# Patient Record
Sex: Female | Born: 1960 | Race: White | Hispanic: No | Smoking: Never smoker
Health system: Southern US, Community
[De-identification: ages and names within clinical notes are randomized; demographics above are authoritative.]

## PROBLEM LIST (undated history)

## (undated) DIAGNOSIS — E785 Hyperlipidemia, unspecified: Secondary | ICD-10-CM

## (undated) DIAGNOSIS — D682 Hereditary deficiency of other clotting factors: Secondary | ICD-10-CM

## (undated) DIAGNOSIS — Z6841 Body Mass Index (BMI) 40.0 and over, adult: Secondary | ICD-10-CM

## (undated) DIAGNOSIS — I8390 Asymptomatic varicose veins of unspecified lower extremity: Secondary | ICD-10-CM

## (undated) DIAGNOSIS — T7840XA Allergy, unspecified, initial encounter: Secondary | ICD-10-CM

## (undated) DIAGNOSIS — F32A Depression, unspecified: Secondary | ICD-10-CM

## (undated) DIAGNOSIS — R0789 Other chest pain: Secondary | ICD-10-CM

## (undated) DIAGNOSIS — F419 Anxiety disorder, unspecified: Secondary | ICD-10-CM

## (undated) DIAGNOSIS — G43909 Migraine, unspecified, not intractable, without status migrainosus: Secondary | ICD-10-CM

## (undated) DIAGNOSIS — F329 Major depressive disorder, single episode, unspecified: Secondary | ICD-10-CM

## (undated) DIAGNOSIS — I8290 Acute embolism and thrombosis of unspecified vein: Secondary | ICD-10-CM

## (undated) HISTORY — DX: Major depressive disorder, single episode, unspecified: F32.9

## (undated) HISTORY — DX: Allergy, unspecified, initial encounter: T78.40XA

## (undated) HISTORY — DX: Hyperlipidemia, unspecified: E78.5

## (undated) HISTORY — DX: Migraine, unspecified, not intractable, without status migrainosus: G43.909

## (undated) HISTORY — DX: Morbid (severe) obesity due to excess calories: E66.01

## (undated) HISTORY — PX: VEIN LIGATION: SHX2652

## (undated) HISTORY — DX: Depression, unspecified: F32.A

## (undated) HISTORY — DX: Body Mass Index (BMI) 40.0 and over, adult: Z684

## (undated) HISTORY — DX: Asymptomatic varicose veins of unspecified lower extremity: I83.90

## (undated) HISTORY — DX: Acute embolism and thrombosis of unspecified vein: I82.90

## (undated) HISTORY — DX: Anxiety disorder, unspecified: F41.9

## (undated) HISTORY — DX: Hereditary deficiency of other clotting factors: D68.2

## (undated) HISTORY — DX: Other chest pain: R07.89

---

## 1998-10-20 ENCOUNTER — Emergency Department (HOSPITAL_COMMUNITY): Admission: EM | Admit: 1998-10-20 | Discharge: 1998-10-20 | Payer: Self-pay | Admitting: Emergency Medicine

## 1998-10-21 ENCOUNTER — Encounter: Payer: Self-pay | Admitting: Emergency Medicine

## 1998-11-13 ENCOUNTER — Encounter: Payer: Self-pay | Admitting: Urology

## 1998-11-13 ENCOUNTER — Encounter: Admission: RE | Admit: 1998-11-13 | Discharge: 1998-11-13 | Payer: Self-pay | Admitting: Urology

## 1998-11-23 ENCOUNTER — Ambulatory Visit (HOSPITAL_COMMUNITY): Admission: RE | Admit: 1998-11-23 | Discharge: 1998-11-23 | Payer: Self-pay | Admitting: Urology

## 1998-11-23 ENCOUNTER — Encounter: Payer: Self-pay | Admitting: Urology

## 2000-10-22 ENCOUNTER — Emergency Department (HOSPITAL_COMMUNITY): Admission: EM | Admit: 2000-10-22 | Discharge: 2000-10-22 | Payer: Self-pay | Admitting: Emergency Medicine

## 2000-10-22 ENCOUNTER — Encounter: Payer: Self-pay | Admitting: Emergency Medicine

## 2003-09-18 ENCOUNTER — Emergency Department (HOSPITAL_COMMUNITY): Admission: EM | Admit: 2003-09-18 | Discharge: 2003-09-18 | Payer: Self-pay | Admitting: Emergency Medicine

## 2004-01-18 HISTORY — PX: TUBAL LIGATION: SHX77

## 2004-06-17 ENCOUNTER — Encounter (INDEPENDENT_AMBULATORY_CARE_PROVIDER_SITE_OTHER): Payer: Self-pay | Admitting: *Deleted

## 2004-06-17 ENCOUNTER — Ambulatory Visit (HOSPITAL_COMMUNITY): Admission: RE | Admit: 2004-06-17 | Discharge: 2004-06-17 | Payer: Self-pay | Admitting: Obstetrics and Gynecology

## 2008-02-15 ENCOUNTER — Ambulatory Visit: Payer: Self-pay

## 2010-04-12 ENCOUNTER — Other Ambulatory Visit: Payer: Self-pay | Admitting: Obstetrics and Gynecology

## 2010-06-04 NOTE — H&P (Signed)
[name], [name]                ACCOUNT NO.:  [id number]   MEDICAL RECORD NO.:  [id number]          PATIENT TYPE:  AMB   LOCATION:  [facility]C                           FACILITY:  [facility]   PHYSICIAN:  Richardean Sale, M.D.   DATE OF BIRTH:  12/02/1960   DATE OF ADMISSION:  06/17/2004  DATE OF DISCHARGE:                                HISTORY & PHYSICAL   PREOPERATIVE DIAGNOSES:  1.  Menorrhagia.  2.  Desires permanent sterilization.   HISTORY OF PRESENT ILLNESS:  This is a 50 year old gravida 0 white female  who has a history of heavy menses that were well controlled with oral  contraceptive pills. The patient subsequently developed a blood clot in her  thigh while on an extended plane flight in August 2005. The patient was  treated with Coumadin and has had no further evidence of clotting. She  undergone evaluation for hypercoagulable state. All of her labs were within  normal limits with the exception of her IGM anticardiolipin antibody which  was borderline elevated. In regards to the patient's menorrhagia, this was  well controlled on oral contraceptive pills. She did have some transient  hypertension on birth control pills but her oral contraceptive pills were  discontinued after development of her blood clot. Since that time, she has  had persistently heavy menses that are regular. Denies any intramenstrual  bleeding.   She presents today for laparoscopic tubal sterilization due to inability to  tolerate hormonal contraceptive pills and hysteroscopy D&C with NovaSure  endometrial ablation for control of menorrhagia.   PAST MEDICAL HISTORY:  1.  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 Does      not require prolonged anticoagulation.  2.  Hypertension on birth control pills. Resolved once pills discontinued.  3.  Menorrhagia.  4.  Migraine.  5.  History of kidney stones.   PAST SURGICAL HISTORY:  None.   OBSTETRICAL HISTORY:  Gravida 0.   GYNECOLOGIC HISTORY:  No history of abnormal Pap smears or sexually  transmitted infection, monogamous. Currently using contraceptive films.   SOCIAL HISTORY:  Denies tobacco, alcohol, or drugs.   FAMILY HISTORY:  No known history of hypercoagulable state or DVT. No  breast, ovarian, uterine, or colon cancer.   MEDICATIONS:  Allegra, Singulair, and Imitrex p.r.n. daily multivitamin.   ALLERGIES:  CODEINE causes nausea and vomiting.   REVIEW OF SYMPTOMS:  Denies chest pain, shortness of breath, lower extremity  edema, or tenderness. No abdominal pain. No fever, chills, sweats, or other  constitutional symptoms.   PHYSICAL EXAMINATION:  VITAL SIGNS:  Blood pressure 102/66, weight 206.  Height 5 foot 5.  Afebrile.  GENERAL:  She is a well-developed, well-nourished white female who is no  acute distress.  NECK:  Within normal limits without masses.  HEART:  Regular rate and rhythm.  LUNGS:  Clear to auscultation bilaterally.  ABDOMEN:  Soft, nontender, and nondistended with no palpable masses. Liver  and spleen are normal.  NEUROLOGICAL:  Nonfocal.  BREASTS:  Symmetric, nontender with no palpable masses. No discharge, no  lymphadenopathy.  PELVIC:  The external genitalia are within normal limits without lesions.  VAGINA:  Pink, moist, and rugated without lesions.  CERVIX:  Visibly normal. The last Pap smear May 2006 within normal limits.  No cervical motion tenderness. Uterus is small, mobile, and nontender.  Retroverted. No adnexal masses or tenderness.   ASSESSMENT:  A 50 year old gravida 0 white female with menorrhagia, desires  permanent sterilization. History of hypertension and venous thrombus while  on oral contraceptive pills.   PLAN:  We will proceed with hysteroscopy, D&C, and NovaSure endometrial  ablation for treatment of menorrhagia and laparoscopic tubal sterilization.  The risks, benefits, and alternatives of the procedure have been reviewed  with the patient in  detail. We discussed the risks of recurrent DVT,  possible pulmonary embolus which could result in death, and recommended  preoperative anticoagulation with subcutaneous heparin for DVT prophylaxis.  We reviewed the risks of hemorrhage, infection, injury to the bowel or the  bladder, uterus, or other intra-abdominal organs which could require  additional surgery. The possibility of the need to perform hysterectomy and  anesthetic related complications. We reviewed alternatives which would be to  proceed with a levonorgestrel-containing IUD but the patient has declined  this option. She is unable to take oral contraceptive pills and has had  persistent breakthrough bleeding while on progesterone only contraceptive  pills. The patient voices understand of all the above risks. She understands  that there is up to a 1% failure rate for tubal sterilization and there is  an increased risk for ectopic pregnancy. Also reviewed the possibility that  the endometrial ablation may not be 100% successful and that she may  experience heavy menses in the future and may require an additional  procedure. I reviewed that the goal of endometrial ablation is to decrease  the amount of menstrual flow and may or may not cause the cessation of  menses. The patient has had endometrial biopsy which was negative. We will  perform D&C at the time of the procedure to rule out any underlying  endometrial pathology. The patient voices understanding of all the above  risks, benefits, and alternatives and desires to proceed. Informed consent  has been obtained.       JW/MEDQ  D:  06/16/2004  T:  06/16/2004  Job:  [id number]

## 2010-06-04 NOTE — Op Note (Signed)
NAMECHRISTELL, [name]                ACCOUNT NO.:  [id number]   MEDICAL RECORD NO.:  [id number]          PATIENT TYPE:  AMB   LOCATION:  [facility]C                           FACILITY:  [facility]   PHYSICIAN:  Richardean Sale, M.D.   DATE OF BIRTH:  [birth year]   DATE OF PROCEDURE:  06/17/2004  DATE OF DISCHARGE:                                 OPERATIVE REPORT   PREOPERATIVE DIAGNOSES:  1.  Desires permanent sterilization.  2.  Menorrhagia.   POSTOPERATIVE DIAGNOSES:  1.  Desires permanent sterilization.  2.  Menorrhagia.   PROCEDURE:  D&C hysteroscopy with attempted NovaSure endometrial ablation  and laparoscopic tubal sterilization.   SURGEON:  Richardean Sale, M.D.   ASSISTANT:  None.   ANESTHESIA:  General.   COMPLICATIONS:  None.   ESTIMATED BLOOD LOSS:  Minimal.   FLUID DEFICIT FOR THE HYSTEROSCOPY:  40 mL.   FINDINGS:  Uterine sound length of 8 cm, cervical length  4 cm. Cavity width  as assessed by NovaSure apparatus only 2.2 cm. Normal appearing endometrial  cavity with a retroflexed uterus.   LAPAROSCOPIC FINDINGS:  Normal ovaries and fallopian tubes bilaterally,  normal liver and gallbladder. Unable to adequately visualize the appendix  secondary to retrocecal placement. Normal appearing uterus.   INDICATIONS FOR PROCEDURE:  This is a 50 year old, gravida 0, white female  who has a history of menorrhagia which was well controlled on oral  contraceptive pills. The patient developed a venous thrombus in August of  2005 during a long plane flight while she was taking oral contraceptive  pills and she is no longer a candidate for combined hormonal contraception.  Since discontinuing her pills, the patient's menses have continued to be  heavy and she desires surgical treatment at this time. In addition, the  patient voices desire for no further childbearing and desires permanent  sterilization. Prior to the procedure, the risks, benefits, and alternatives  of the alternatives  of the procedure were reviewed with the patient in  detail. We reviewed alternatives such as a levonorgestrel IUD, progesterone  only oral contraceptive pills and Depo-Provera. We discussed the risks of  the procedure which include but not limited to hemorrhage requiring  transfusion, infection, injury to the bowel, the bladder, the uterus or  other organs which could additional surgery possibly requiring hysterectomy  or bowel or bladder surgery. We also discussed the risk of recurrent deep  vein thrombosis and pulmonary embolus which could be fatal. Also discussed  the risk of cardiac events and anesthesia. The patient voices understanding  of all the risks and benefits and alternatives and desires to proceed.  Informed consent was obtained.   DESCRIPTION OF PROCEDURE:  Two hours prior to the procedure, the patient  received a subcutaneous injection of heparin 5000 units for DVT prophylaxis.  The patient was then taken to the operating room, given general anesthesia  and was then prepped and draped in the usual sterile fashion and placed in  the dorsal lithotomy position. A Foley catheter was then placed in the  bladder. Bimanual exam confirmed the presence of a small retroverted  uterus  which was mobile with no obvious masses in the pelvis or adnexa. A speculum  was then placed in the vagina, the cervix was then visualized and a  paracervical block was administered using a total of 18 mL of 1% Nesacaine.  The cervix was grasped on the anterior lip with a single tooth tenaculum and  the uterus was then sounded to 8 cm. The cervix was then very gently  dilated. Once adequate dilation was achieved, the diagnostic hysteroscope  was introduced. Evaluation of the cavity revealed a retroverted uterus, no  obvious endometrial pathology was identified. The hysteroscope was then  removed and a sharp curettage was performed. This specimen was then sent to  pathology labeled as endometrial  curettings. At this point, the cervical  length was then measured with a Pratt dilator and measured at 4 cm. These  measurements were then entered into the NovaSure endometrial ablation  computer device. The NovaSure device was then inserted into the uterine  cavity and two attempts were made to appropriately fit the device into the  cavity. The maximum cavity width that could be achieved after fitting the  device was only 2.2 cm. Given that the recommendation is not to perform an  ablation if the cavity width is less than 2.5 cm, the ablation procedure was  not performed. The endometrial NovaSure ablation device was then removed  from the uterus, the hysteroscope was reintroduced and the uterine cavity  was very carefully inspected. The cavity was intact. There was no evidence  of any perforation and the hysteroscope was removed. Fluid deficit for the  hysteroscopic portion of the procedure was 40 mL. The acorn cannula was then  introduced into the cervix and connected to the single tooth tenaculum. The  posterior weighted speculum was removed and attention was then turned to the  patient's abdomen. Sterile gown and gloves were changed. The patient's  abdomen which had been prepped at the beginning of the procedure had  remained sterile throughout the hysteroscopic procedure. At this point, 5 mL  of 0.25% plain Marcaine were injected into the infraumbilical area. A 10 mm  skin incision was then made with the scalpel. This was carried down sharply  to the fascia. The fascia was then grasped between two Kocher clamps,  elevated and the fascia was then entered sharply. Once the fascial incision  was adequate, the fascia was secured with a pursestring Vicryl suture. At  this point, a significant amount of preperitoneal fat was encountered and  identification of the peritoneum was very difficult. An attempt was made to  enter the peritoneum bluntly. The Hasson trocar and sleeve were  then introduced and the camera was then used and was found to not be in the  peritoneal cavity, but in the preperitoneal space. Therefore the camera was  removed. The Hasson trocar and sleeve were removed and using blunt  dissection, the peritoneum was then able to be identified. It was grasped  between two hemostats and was entered sharply. A finger was placed into the  peritoneal incision and was swept underneath and there was no evidence of  any adhesions of the bowel or omentum to the abdominal wall. At this point,  the Hasson trocar and sleeve were reintroduced. CO2 gas was allowed to  insufflate and the laparoscope was then used to confirm intraabdominal  placement. The liver and gallbladder were then visualized and appeared  normal. The appendix was retrocecal and was difficult to visualize but there  was  no obvious appendiceal pathology. The uterus was identified and was  retroflexed and appeared normal. A second incision was then made 2 cm above  the symphysis pubis. This incision was made after 2 mL of 0.25% plain  Marcaine were injected and a 5 mm skin incision made with the scalpel.  Through this, the 5 mm trocar and sleeve were attempted to be introduced.  Given the significant amount of subcutaneous preperitoneal fat, the extra  long trocar had to be used. This was inserted in the incision very carefully  and intraabdominal placement was confirmed with a laparoscope. There was no  evidence of any trauma to the bowel or any other organs during placement of  either trocar. At this point, a blunt probe was then used to examine the  pelvis with the findings noted above. Both ovaries were visualized and were  normal. Both fallopian tubes were visualized and appeared normal, the  fimbriated ends appeared normal as well. At this point, the blunt probe was  removed, this was replaced by the Kleppinger bipolar cautery and both  fallopian tubes were then successfully cauterized at the  mid portion of the  tube. An approximately 3 cm segment was cauterized and good blanching noted.  At the completion of the cauterization of the fallopian tubes, the pelvis  was inspected and there was no evidence of any active bleeding. The 5 mm  trocar sleeve was then removed under direct visualization. CO2 gas was  allowed to escape and the camera and the Hasson trocar and sleeve were then  removed. A finger was placed into the umbilical incision and the fascial  stitch was pulled tight and tied with good closure of the fascial incision.  The skin incisions were then both closed with 4-0 Vicryl suture in  subcutaneous fashion. At this point, attention was then turned back to the  vagina where the single tooth tenaculum and the acorn cannula were removed.  Silver nitrate was used to cauterize the single tooth tenaculum site. The  patient was then taken out of the dorsal lithotomy position and was awakened  from a general anesthesia and was taken to the recovery room awake in stable condition. All sponge, lap, needle and instrument counts were correct x2 and  there were no complications.       JW/MEDQ  D:  06/17/2004  T:  06/17/2004  Job:  [id number]

## 2011-03-30 ENCOUNTER — Ambulatory Visit (INDEPENDENT_AMBULATORY_CARE_PROVIDER_SITE_OTHER): Payer: BC Managed Care – PPO | Admitting: Emergency Medicine

## 2011-03-30 VITALS — BP 124/86 | HR 74 | Temp 98.2°F | Resp 16 | Ht 64.0 in | Wt 198.0 lb

## 2011-03-30 DIAGNOSIS — F419 Anxiety disorder, unspecified: Secondary | ICD-10-CM

## 2011-03-30 DIAGNOSIS — F411 Generalized anxiety disorder: Secondary | ICD-10-CM

## 2011-03-30 MED ORDER — CLONAZEPAM 0.5 MG PO TABS: 0.5000 mg | ORAL_TABLET | Freq: Two times a day (BID) | ORAL | Status: DC | PRN

## 2011-03-30 MED ORDER — CLONAZEPAM 0.5 MG PO TABS: 0.5000 mg | ORAL_TABLET | Freq: Two times a day (BID) | ORAL | Status: AC | PRN

## 2011-03-30 NOTE — Patient Instructions (Signed)

## 2011-03-30 NOTE — Progress Notes (Signed)
  Subjective:    Patient ID: [name], female    DOB: Apr 28, 1960, 51 y.o.   MRN: [id number]  HPI patient enters with the chief complaint of anxiety. She is under a lot of stress at home. She lived in [place] the last year. She is moving back home and has responsibilities for her mother and father. She is a very stressful job he   Review of Systems review of systems was noncontributory. She is agreeable to have a physical in the near future.     Objective:   Physical Exam HEENT negative thyroid not enlarged. Chest is clear heart regular rate no murmurs Patient completed as an anxiety scale with a raw score of 48 index of  60 consistent with moderate to severe anxiety.      Assessment & Plan:  Patient has an anxiety disorder most likely related to the stress of caring for her parents. She also has a lot of work-related stress.

## 2011-03-31 LAB — T4, FREE: Free T4: 1.34 ng/dL (ref 0.80–1.80)

## 2011-03-31 LAB — TSH: TSH: 1.708 u[IU]/mL (ref 0.350–4.500)

## 2011-07-19 ENCOUNTER — Encounter: Payer: Self-pay | Admitting: Emergency Medicine

## 2011-07-19 ENCOUNTER — Ambulatory Visit (INDEPENDENT_AMBULATORY_CARE_PROVIDER_SITE_OTHER): Payer: BC Managed Care – PPO | Admitting: Emergency Medicine

## 2011-07-19 VITALS — BP 130/89 | HR 76 | Temp 96.9°F | Resp 16 | Ht 64.5 in | Wt 208.6 lb

## 2011-07-19 DIAGNOSIS — R079 Chest pain, unspecified: Secondary | ICD-10-CM

## 2011-07-19 DIAGNOSIS — Z0000 Encounter for general adult medical examination without abnormal findings: Secondary | ICD-10-CM

## 2011-07-19 DIAGNOSIS — R4589 Other symptoms and signs involving emotional state: Secondary | ICD-10-CM

## 2011-07-19 LAB — CBC WITH DIFFERENTIAL/PLATELET
Basophils Absolute: 0 10*3/uL (ref 0.0–0.1)
Eosinophils Relative: 1 % (ref 0–5)
Lymphocytes Relative: 25 % (ref 12–46)
MCV: 89.2 fL (ref 78.0–100.0)
Neutro Abs: 3.1 10*3/uL (ref 1.7–7.7)
Neutrophils Relative %: 66 % (ref 43–77)
Platelets: 223 10*3/uL (ref 150–400)
RDW: 13.6 % (ref 11.5–15.5)
WBC: 4.7 10*3/uL (ref 4.0–10.5)

## 2011-07-19 LAB — POCT URINALYSIS DIPSTICK
Bilirubin, UA: NEGATIVE
Blood, UA: NEGATIVE
Ketones, UA: NEGATIVE
Protein, UA: NEGATIVE
pH, UA: 6

## 2011-07-19 LAB — COMPREHENSIVE METABOLIC PANEL
ALT: 15 U/L (ref 0–35)
AST: 18 U/L (ref 0–37)
Calcium: 9.5 mg/dL (ref 8.4–10.5)
Chloride: 104 mEq/L (ref 96–112)
Creat: 0.78 mg/dL (ref 0.50–1.10)
Total Bilirubin: 0.6 mg/dL (ref 0.3–1.2)

## 2011-07-19 LAB — LIPID PANEL
HDL: 51 mg/dL (ref >39)
LDL Cholesterol: 153 mg/dL — ABNORMAL HIGH (ref 0–99)
Total CHOL/HDL Ratio: 4.5 Ratio

## 2011-07-19 LAB — POCT UA - MICROSCOPIC ONLY
Bacteria, U Microscopic: NEGATIVE
Casts, Ur, LPF, POC: NEGATIVE
Crystals, Ur, HPF, POC: NEGATIVE

## 2011-07-19 NOTE — Progress Notes (Signed)
@[email or web address] @  Patient ID: [name] MRN: [id number], DOB: 30-May-1960, 51 y.o. Date of Encounter: 07/19/2011, 3:36 PM  Primary Physician: No primary provider on file.  Chief Complaint: Physical (CPE)  HPI: 51 y.o. y/o female with history of noted below here for CPE.  Doing well. No issues/complaints.  LMP:  Pap: MMG: Review of Systems:  Consitutional: No fever, chills, fatigue, night sweats, lymphadenopathy, or weight changes. Eyes: No visual changes, eye redness, or discharge. ENT/Mouth: Ears: No otalgia, tinnitus, hearing loss, discharge. Nose: No congestion, rhinorrhea, sinus pain, or epistaxis. Throat: No sore throat, post nasal drip, or teeth pain. Cardiovascular: She has had some nonexertional chest tightness she has a family history of coronary artery disease , palpitations, diaphoresis, DOE, edema, orthopnea, PND. Respiratory: She has a history of exercise-induced asthma she has Dulera to take but rarely takes it. As a pro-air inhaler to use as  Gastrointestinal: No anorexia, dysphagia, reflux, pain, nausea, vomiting, hematemesis, diarrhea, constipation, BRBPR, or melena. Breast: No discharge, pain, swelling, or mass. Genitourinary: No dysuria, frequency, urgency, hematuria, incontinence, nocturia, amenorrhea, vaginal discharge, pruritis, burning, abnormal bleeding, or pain. Musculoskeletal: No decreased ROM, myalgias, stiffness, joint swelling, or weakness. Skin: No rash, erythema, lesion changes, pain, warmth, jaundice, or pruritis. Neurological: She is a history of migraines currently under treatment with Botox injections with Dr. Terrace Arabia  , dizziness, syncope, seizures, tremors, memory loss, coordination problems, or paresthesias. Psychological: No anxiety, depression, hallucinations, SI/HI. Endocrine: No fatigue, polydipsia, polyphagia, polyuria, or known diabetes. Patient concerned about weight gain and wants to get back to exercising All other systems were reviewed and are  otherwise negative.  Past Medical History  Diagnosis Date  . Allergy   . Migraine      Past Surgical History  Procedure Date  . Tubal ligation 2006    Home Meds:  Prior to Admission medications   Medication Sig Start Date End Date Taking? Authorizing Provider  aspirin 81 MG tablet Take 81 mg by mouth daily.   Yes Historical Provider, MD  Cholecalciferol (VITAMIN D3) 1000 UNITS CAPS Take 1,000 Int'l Units by mouth daily.   Yes Historical Provider, MD  frovatriptan (FROVA) 2.5 MG tablet Take 2.5 mg by mouth as needed. If recurs, may repeat after 2 hours. Max of 3 tabs in 24 hours.   Yes Historical Provider, MD  NON FORMULARY 500 mg daily.   Yes Historical Provider, MD  SUMAtriptan (IMITREX) 50 MG tablet Take 50 mg by mouth every 2 (two) hours as needed.   Yes Historical Provider, MD  vitamin E (VITAMIN E) 1000 UNIT capsule Take 1,000 Units by mouth daily.   Yes Historical Provider, MD    Allergies:  Allergies  Allergen Reactions  . Codeine Hives    rash    History   Social History  . Marital Status: Divorced    Spouse Name: N/A    Number of Children: N/A  . Years of Education: N/A   Occupational History  . Not on file.   Social History Main Topics  . Smoking status: Never Smoker   . Smokeless tobacco: Not on file  . Alcohol Use: Not on file  . Drug Use: Not on file  . Sexually Active: Not on file   Other Topics Concern  . Not on file   Social History Narrative  . No narrative on file    Family History  Problem Relation Age of Onset  . Diabetes Mother 61    type 2  . Diabetes Father 40  type 2  . Dementia Father 61    Physical Exam  Blood pressure 130/89, pulse 76, temperature 96.9 F (36.1 C), temperature source Oral, resp. rate 16, height 5' 4.5" (1.638 m), weight 208 lb 9.6 oz (94.62 kg), last menstrual period 05/31/2011., Body mass index is 35.25 kg/(m^2). General: Well developed, well nourished, in no acute distress. HEENT: Normocephalic,  atraumatic. Conjunctiva pink, sclera non-icteric. Pupils 2 mm constricting to 1 mm, round, regular, and equally reactive to light and accomodation. EOMI. Internal auditory canal clear. TMs with good cone of light and without pathology. Nasal mucosa pink. Nares are without discharge. No sinus tenderness. Oral mucosa pink. Dentition normal . Pharynx without exudate.   Neck: Supple. Trachea midline. No thyromegaly. Full ROM. No lymphadenopathy. Lungs: Clear to auscultation bilaterally without wheezes, rales, or rhonchi. Breathing is of normal effort and unlabored. Cardiovascular: RRR with S1 S2. No murmurs, rubs, or gallops appreciated. Distal pulses 2+ symmetrically. No carotid or abdominal bruits  Breast: No tenderness or masses felt  Abdomen: Soft, non-tender, non-distended with normoactive bowel sounds. No hepatosplenomegaly or masses. No rebound/guarding. No CVA tenderness. Without hernias.  Genitourinary: Not examined   Musculoskeletal: Full range of motion and 5/5 strength throughout. Without swelling, atrophy, tenderness, crepitus, or warmth. Extremities without clubbing, cyanosis, or edema. Calves supple. Skin: Warm and moist without erythema, ecchymosis, wounds, or rash. Neuro: A+Ox3. CN II-XII grossly intact. Moves all extremities spontaneously. Full sensation throughout. Normal gait. DTR 2+ throughout upper and lower extremities. Finger to nose intact. Psych:  Responds to questions appropriately with a normal affect.   EKG normal     Assessment/Plan:  51 y.o. y/o female here for CPE. She has had some episodes of chest tightness with a bad family history of coronary artery disease will progress with a cardiology evaluation. She overall is in good health except for her weight needs to work on exercise and weight loss.  -  Signed, Earl Lites, MD 07/19/2011 3:36 PM

## 2011-07-20 LAB — VITAMIN D 25 HYDROXY (VIT D DEFICIENCY, FRACTURES): Vit D, 25-Hydroxy: 13 ng/mL — ABNORMAL LOW (ref 30–89)

## 2011-08-24 ENCOUNTER — Encounter: Payer: Self-pay | Admitting: Cardiology

## 2011-08-24 ENCOUNTER — Ambulatory Visit (INDEPENDENT_AMBULATORY_CARE_PROVIDER_SITE_OTHER): Payer: Self-pay | Admitting: Cardiology

## 2011-08-24 VITALS — BP 129/81 | HR 82 | Ht 65.0 in | Wt 205.0 lb

## 2011-08-24 DIAGNOSIS — R0789 Other chest pain: Secondary | ICD-10-CM

## 2011-08-24 DIAGNOSIS — E7800 Pure hypercholesterolemia, unspecified: Secondary | ICD-10-CM

## 2011-08-24 DIAGNOSIS — R079 Chest pain, unspecified: Secondary | ICD-10-CM

## 2011-08-24 DIAGNOSIS — Z8249 Family history of ischemic heart disease and other diseases of the circulatory system: Secondary | ICD-10-CM

## 2011-08-24 HISTORY — DX: Pure hypercholesterolemia, unspecified: E78.00

## 2011-08-24 HISTORY — DX: Other chest pain: R07.89

## 2011-08-24 HISTORY — DX: Family history of ischemic heart disease and other diseases of the circulatory system: Z82.49

## 2011-08-24 NOTE — Patient Instructions (Signed)
We will schedule you for an exercise stress test   

## 2011-08-24 NOTE — Assessment & Plan Note (Signed)
Her symptoms of chest pain are atypical. She does have some mild dysphasia symptoms. Her symptoms are related to periods of stress. Overall I feel that her cardiovascular risk is relatively low but given her family history and history of hypercholesterolemia I recommended a routine stress test to further screen for cardiac disease. If normal I would reassure her and consider antireflux therapy.

## 2011-08-24 NOTE — Progress Notes (Signed)
[name] Date of Birth: 1960-03-13 Medical Record #[id number]  History of Present Illness: [name] is seen the request of Dr. Cleta Alberts for evaluation of chest pain. She's a very pleasant 51 year old white female who has a history of hypercholesterolemia and family history of early coronary disease. Her brother died at age 3 with a myocardial infarction. She states that he did not take care of himself and was a heavy smoker. She reports that over the past 5-6 months she has had chest discomfort off and on described as a heaviness or pressure. This appears to be mostly related to periods of stress and are not related to activity. Occasionally she has had an uncomfortable feeling with swallowing where it feels like food does not go down normally. She has been under a lot of stress working full-time and trying to take care of her parents on the weekends. She does report that she had some type of stress test many years ago. She does have a long-standing history of frequent migraines. She has never experienced chest pain with ergot alkaloids.  Current Outpatient Prescriptions on File Prior to Visit  Medication Sig Dispense Refill  . aspirin 81 MG tablet Take 81 mg by mouth daily.      . Cholecalciferol (VITAMIN D3) 1000 UNITS CAPS Take 1,000 Int'l Units by mouth daily.      . frovatriptan (FROVA) 2.5 MG tablet Take 2.5 mg by mouth as needed. If recurs, may repeat after 2 hours. Max of 3 tabs in 24 hours.      . NON FORMULARY 500 mg daily.      . SUMAtriptan (IMITREX) 50 MG tablet Take 50 mg by mouth every 2 (two) hours as needed.      . vitamin E (VITAMIN E) 1000 UNIT capsule Take 1,000 Units by mouth daily.        Allergies  Allergen Reactions  . Codeine Hives    rash    Past Medical History  Diagnosis Date  . Allergy   . Migraine   . Chest tightness   . Hyperlipidemia     Past Surgical History  Procedure Date  . Tubal ligation 2006    History  Smoking status  . Never  Smoker   Smokeless tobacco  . Not on file    History  Alcohol Use  . Yes    Family History  Problem Relation Age of Onset  . Diabetes Mother 52    type 2  . Diabetes Father 74    type 2  . Dementia Father 92    Review of Systems: The review of systems is positive for intermittent dysphasia. She has no history of diabetes or hypertension. She has been treated with Botox for her migraines. All other systems were reviewed and are negative.  Physical Exam: BP 129/81  Pulse 82  Ht 5\' 5"  (1.651 m)  Wt 205 lb (92.987 kg)  BMI 34.11 kg/m2 She is a pleasant overweight white female in no acute distress.The patient is alert and oriented x 3.  The mood and affect are normal.  The skin is warm and dry.  Color is normal.  The HEENT exam reveals that the sclera are nonicteric.  The mucous membranes are moist.  The carotids are 2+ without bruits.  There is no thyromegaly.  There is no JVD.  The lungs are clear.  The chest wall is non tender.  The heart exam reveals a regular rate with a normal S1 and S2.  There are no murmurs, gallops, or rubs.  The PMI is not displaced.   Abdominal exam reveals good bowel sounds.  There is no guarding or rebound.  There is no hepatosplenomegaly or tenderness.  There are no masses.  Exam of the legs reveal no clubbing, cyanosis, or edema.  The legs are without rashes.  The distal pulses are intact.  Cranial nerves II - XII are intact.  Motor and sensory functions are intact.  The gait is normal.  LABORATORY DATA: ECG demonstrates normal sinus rhythm with mild nonspecific T-wave abnormality. Recent CBC was normal, complete chemistry panel was normal, TSH was normal. Total cholesterol 2:30, triglycerides 130, HDL 51, LDL 153.  Assessment / Plan:

## 2011-09-12 ENCOUNTER — Encounter: Payer: Self-pay | Admitting: Physician Assistant

## 2011-09-12 ENCOUNTER — Ambulatory Visit (INDEPENDENT_AMBULATORY_CARE_PROVIDER_SITE_OTHER): Payer: Self-pay | Admitting: Physician Assistant

## 2011-09-12 DIAGNOSIS — E7800 Pure hypercholesterolemia, unspecified: Secondary | ICD-10-CM

## 2011-09-12 DIAGNOSIS — R079 Chest pain, unspecified: Secondary | ICD-10-CM

## 2011-09-12 NOTE — Procedures (Signed)
Exercise Treadmill Test  Pre-Exercise Testing Evaluation Rhythm: normal sinus  Rate: 82   PR:  .14 QRS:  .07  QT:  .36 QTc: .42     Test  Exercise Tolerance Test Ordering MD: Peter Swaziland, MD  Interpreting MD: Tereso Newcomer , PA-C  Unique Test No: 1  Treadmill:  1  Indication for ETT: chest pain - rule out ischemia  Contraindication to ETT: No   Stress Modality: exercise - treadmill  Cardiac Imaging Performed: non   Protocol: standard Bruce - maximal  Max BP:  144/72  Max MPHR (bpm):  169 144  MPHR obtained (bpm):  172 % MPHR obtained:  102%  Reached 85% MPHR (min:sec):  3:35 Total Exercise Time (min-sec):  6:30  Workload in METS:  7.7 Borg Scale: 15  Reason ETT Terminated:  patient's desire to stop    ST Segment Analysis At Rest: normal ST segments - no evidence of significant ST depression With Exercise: non-specific ST changes  Other Information Arrhythmia:  No; Occ PVC Angina during ETT:  absent (0) Quality of ETT:  diagnostic  ETT Interpretation:  normal - no evidence of ischemia by ST analysis  Comments: Fair exercise tolerance. No chest pain. Normal BP response to exercise. Occasional PVCs. No ST-T changes to suggest ischemia.   Recommendations: Follow up with Dr. Peter Swaziland as directed. Tereso Newcomer, PA-C  11:01 AM 09/12/2011

## 2012-06-07 ENCOUNTER — Telehealth: Payer: Self-pay

## 2012-06-07 MED ORDER — SCOPOLAMINE 1 MG/3DAYS TD PT72: 1.0000 | MEDICATED_PATCH | TRANSDERMAL | Status: DC

## 2012-06-07 NOTE — Telephone Encounter (Signed)
Pt is calling to see if she could get some patches called in as a prescription for nausea for a cruise she is going to go on Call back number is [phone] Pharmacy is [facility] [facility] [address] [place]

## 2012-06-07 NOTE — Telephone Encounter (Signed)
Scopolamine patches sent to pharmacy.

## 2012-07-30 ENCOUNTER — Telehealth: Payer: Self-pay | Admitting: Neurology

## 2012-07-30 MED ORDER — FROVATRIPTAN SUCCINATE 2.5 MG PO TABS: 2.5000 mg | ORAL_TABLET | ORAL | Status: DC | PRN

## 2012-07-30 MED ORDER — SUMATRIPTAN SUCCINATE 50 MG PO TABS: 50.0000 mg | ORAL_TABLET | Freq: Two times a day (BID) | ORAL | Status: DC | PRN

## 2012-07-30 NOTE — Telephone Encounter (Signed)
Rx's have been sent. 

## 2012-09-12 ENCOUNTER — Encounter: Payer: Self-pay | Admitting: Neurology

## 2012-09-13 ENCOUNTER — Ambulatory Visit (INDEPENDENT_AMBULATORY_CARE_PROVIDER_SITE_OTHER): Payer: 59 | Admitting: Neurology

## 2012-09-13 ENCOUNTER — Encounter: Payer: Self-pay | Admitting: Neurology

## 2012-09-13 VITALS — BP 139/90 | HR 81 | Ht 64.0 in | Wt 224.0 lb

## 2012-09-13 DIAGNOSIS — G43719 Chronic migraine without aura, intractable, without status migrainosus: Secondary | ICD-10-CM

## 2012-09-13 DIAGNOSIS — E7800 Pure hypercholesterolemia, unspecified: Secondary | ICD-10-CM

## 2012-09-13 DIAGNOSIS — G43909 Migraine, unspecified, not intractable, without status migrainosus: Secondary | ICD-10-CM

## 2012-09-13 HISTORY — DX: Chronic migraine without aura, intractable, without status migrainosus: G43.719

## 2012-09-13 MED ORDER — FROVATRIPTAN SUCCINATE 2.5 MG PO TABS: 2.5000 mg | ORAL_TABLET | ORAL | Status: DC | PRN

## 2012-09-13 MED ORDER — SUMATRIPTAN SUCCINATE 6 MG/0.5ML ~~LOC~~ SOLN: 6.0000 mg | SUBCUTANEOUS | Status: DC | PRN

## 2012-09-13 MED ORDER — ONABOTULINUMTOXINA 100 UNITS IJ SOLR
150.0000 [IU] | Freq: Once | INTRAMUSCULAR | Status: AC
  Administered 2012-09-13: 150 [IU] via INTRAMUSCULAR

## 2012-09-13 NOTE — Addendum Note (Signed)
Addended by: Levert Feinstein on: 09/13/2012 09:37 AM   Modules accepted: Orders

## 2012-09-13 NOTE — Progress Notes (Signed)
History of Present Illness:   [name] is a 52 year old Caucasian female, came in for Botox injection for chronic migraine headaches.  HISTORY: She has a history of chronic migraine headaches for a long time, increased since 2008, her migraine usually start at one side, occipital region, spreading forward, throbbing severe headache, with smell and light and noise sensitivity,  lasting 2-3 days  About half times in a month, she would have some degree of headaches, out of those headaches, about 2-5 are severe long-lasting headache, lasting about 2-3 days,  over the past few years, she has tried different preventive medications, Topamax, cause confusion, difficulty talking, propanolol, she could not tolerate, and doesn't help, antidepression-excessive fatigue, no help.  She was able to identify trigger of her migraine, stress, menstruation period of time, sometimes woke up with severe headache,  she is currently taking Frova and Imitrex tablets, which helped her majority of the time, this is her first Botox injection as migraine prevention  UPDATE August 28th 2014: She responded very well to last Botox injection January 2014, began to notice increased frequency of headaches since June 2014, about once a week, at the maximum benefit to Botox, she only has one to 2 headaches each month, Frova has always been able to take care of her headache, but she usually tried Imitrex tablets first, 20% of time she has to take second tablet by the afternoon, she previously responded to Imitrex subcutaneous injection very well, she worried about losing benefit of Frova with repeat use.   Review of Systems  Out of a complete 14 system review, the patient complains of only the following symptoms, and all other reviewed systems are negative.   Social History The patient is divorced. Patient has no children. Patient has a high school education. Patient works for [employer].  Patient does not smoke or drink. Patient  states intolerance to codeine.  Inhaled Tobacco Use: never smoker  Family History Both parents are still living. There is a family history of diabetes. Patient has one brother. No family history of headache is noted with exception of one grandmother. Sister has a history of early diabetes. Both parents have diabetes.  Father has a history of Alzheimer's disease.   Past Medical History One. Migraine headache 2. Mild obesity 3. Bilateral tubal ligation  4. Left Achilles tendon surgery  Physical Exam  General: This patient is alert and cooperative at the time of the examination. Patient is minimally obese. Skin: No significant peripheral edema is noted.  Neurologic Exam  Cranial Nerves: Facial symmetry is present.  Speech is normal, no aphasia or dysarthria is noted.  Extraocular movements are full.  Visual fields are full.   Motor: Patient has good strength in all 4 extremities. Coordination: Patient has good finger-to-nose and heel-to-shin bilaterally. Gait and Station: Patient has a normal gait.  Tandem gait is normal.  Romberg is negative.  No drift is seen.   Reflexes: Deep tendon reflexes are symmetric.   Assessment and Plan:  BOTOX injection was performed according to protocol by [employer]. 100 units of BOTOX was dissolved into 2 cc NS. (Lot [id number], exp Jan 2017)  Corrugator 2 sites, 10 units Frontalis 4 sites,  20 units, Temporalis 8 sites,  40 units  Occipitalis 6 sites, 30 units Cervical Paraspinal, 4 sites, 20 units Trapezius, 6 sites, 30 units   Patient tolerate the injection well. Will return for repeat injection in 3 months.

## 2012-09-19 ENCOUNTER — Other Ambulatory Visit: Payer: Self-pay

## 2012-09-19 MED ORDER — SUMATRIPTAN SUCCINATE 6 MG/0.5ML ~~LOC~~ SOLN: 6.0000 mg | SUBCUTANEOUS | Status: DC | PRN

## 2012-12-19 ENCOUNTER — Encounter: Payer: Self-pay | Admitting: Neurology

## 2012-12-19 ENCOUNTER — Ambulatory Visit (INDEPENDENT_AMBULATORY_CARE_PROVIDER_SITE_OTHER): Payer: 59 | Admitting: Neurology

## 2012-12-19 VITALS — Ht 65.0 in | Wt 231.0 lb

## 2012-12-19 DIAGNOSIS — G43719 Chronic migraine without aura, intractable, without status migrainosus: Secondary | ICD-10-CM

## 2012-12-19 DIAGNOSIS — G43909 Migraine, unspecified, not intractable, without status migrainosus: Secondary | ICD-10-CM

## 2012-12-19 MED ORDER — ONABOTULINUMTOXINA 100 UNITS IJ SOLR
150.0000 [IU] | Freq: Once | INTRAMUSCULAR | Status: AC
  Administered 2012-12-19: 150 [IU] via INTRAMUSCULAR

## 2012-12-19 NOTE — Progress Notes (Signed)
History of Present Illness:   [name] is a 52 year old Caucasian female, came in for Botox injection for chronic migraine headaches.  HISTORY: She has a history of chronic migraine headaches for a long time, increased since 2008, her migraine usually start at one side, occipital region, spreading forward, throbbing severe headache, with smell and light and noise sensitivity,  lasting 2-3 days  About half times in a month, she would have some degree of headaches, out of those headaches, about 2-5 are severe long-lasting headache, lasting about 2-3 days,  over the past few years, she has tried different preventive medications, Topamax, cause confusion, difficulty talking, propanolol, she could not tolerate, and doesn't help, antidepression-excessive fatigue, no help.  She was able to identify trigger of her migraine, stress, menstruation period of time, sometimes woke up with severe headache,  she is currently taking Frova and Imitrex tablets, which helped her majority of the time, this is her first Botox injection as migraine prevention  She responded very well to last Botox injection, she began to notice increased frequency of headaches since June 2014, about once a week, at the maximum benefit to Botox, she only has one to 2 headaches each month, Frova has always been able to take care of her headache, but she usually tried Imitrex tablets first, 20% of time she has to take second tablet by the afternoon, she previously responded to Imitrex subcutaneous injection very well, she worried about losing benefit of Frova with repeat use.  UPDATE 12/19/2012: She is doing well with Imitrex injection, she has total 12 headaches in past 3 months, She still travels a lot, also taking care of her elderly parents,   Review of Systems  Out of a complete 14 system review, the patient complains of only the following symptoms, and all other reviewed systems are negative.   Social History The patient is divorced.  Patient has no children. Patient has a high school education. Patient works for [employer].  Patient does not smoke or drink. Patient states intolerance to codeine.  Inhaled Tobacco Use: never smoker  Family History Both parents are still living. There is a family history of diabetes. Patient has one brother. No family history of headache is noted with exception of one grandmother. Sister has a history of early diabetes. Both parents have diabetes.  Father has a history of Alzheimer's disease.   Past Medical History One. Migraine headache 2. Mild obesity 3. Bilateral tubal ligation  4. Left Achilles tendon surgery  Physical Exam  General: This patient is alert and cooperative at the time of the examination. Patient is minimally obese. Skin: No significant peripheral edema is noted.  Neurologic Exam  Cranial Nerves: Facial symmetry is present.  Speech is normal, no aphasia or dysarthria is noted.  Extraocular movements are full.  Visual fields are full.   Motor: Patient has good strength in all 4 extremities. Coordination: Patient has good finger-to-nose and heel-to-shin bilaterally. Gait and Station: Patient has a normal gait.  Tandem gait is normal.  Romberg is negative.  No drift is seen.   Reflexes: Deep tendon reflexes are symmetric.   Assessment and Plan:  BOTOX injection was performed according to protocol by [employer]. 100 units of BOTOX was dissolved into 2 cc NS. (Lot [id number], exp March 2017)  Corrugator 2 sites, 10 units Procerus 5 units Frontalis 4 sites,  20 units, Temporalis 8 sites,  40 units  Occipitalis 6 sites, 30 units Cervical Paraspinal, 4 sites, 20 units Trapezius, 6 sites, 25  units   Patient tolerate the injection well. Will return for repeat injection in 3 months.

## 2012-12-25 ENCOUNTER — Encounter: Payer: Self-pay | Admitting: Emergency Medicine

## 2012-12-25 ENCOUNTER — Ambulatory Visit: Payer: 59

## 2012-12-25 ENCOUNTER — Ambulatory Visit (INDEPENDENT_AMBULATORY_CARE_PROVIDER_SITE_OTHER): Payer: 59 | Admitting: Emergency Medicine

## 2012-12-25 VITALS — BP 120/80 | HR 88 | Temp 98.3°F | Resp 16 | Ht 65.0 in | Wt 233.6 lb

## 2012-12-25 DIAGNOSIS — K219 Gastro-esophageal reflux disease without esophagitis: Secondary | ICD-10-CM

## 2012-12-25 DIAGNOSIS — R8281 Pyuria: Secondary | ICD-10-CM

## 2012-12-25 DIAGNOSIS — Z23 Encounter for immunization: Secondary | ICD-10-CM

## 2012-12-25 DIAGNOSIS — R635 Abnormal weight gain: Secondary | ICD-10-CM

## 2012-12-25 DIAGNOSIS — Z0000 Encounter for general adult medical examination without abnormal findings: Secondary | ICD-10-CM

## 2012-12-25 DIAGNOSIS — E669 Obesity, unspecified: Secondary | ICD-10-CM

## 2012-12-25 DIAGNOSIS — Z8249 Family history of ischemic heart disease and other diseases of the circulatory system: Secondary | ICD-10-CM

## 2012-12-25 DIAGNOSIS — E559 Vitamin D deficiency, unspecified: Secondary | ICD-10-CM

## 2012-12-25 DIAGNOSIS — M79609 Pain in unspecified limb: Secondary | ICD-10-CM

## 2012-12-25 DIAGNOSIS — R82998 Other abnormal findings in urine: Secondary | ICD-10-CM

## 2012-12-25 DIAGNOSIS — M79641 Pain in right hand: Secondary | ICD-10-CM

## 2012-12-25 DIAGNOSIS — Z1211 Encounter for screening for malignant neoplasm of colon: Secondary | ICD-10-CM

## 2012-12-25 DIAGNOSIS — E785 Hyperlipidemia, unspecified: Secondary | ICD-10-CM

## 2012-12-25 LAB — CBC WITH DIFFERENTIAL/PLATELET
Basophils Absolute: 0 10*3/uL (ref 0.0–0.1)
Basophils Relative: 1 % (ref 0–1)
Eosinophils Relative: 4 % (ref 0–5)
Lymphocytes Relative: 28 % (ref 12–46)
MCHC: 34.8 g/dL (ref 30.0–36.0)
MCV: 85.7 fL (ref 78.0–100.0)
Platelets: 238 10*3/uL (ref 150–400)
RDW: 13.7 % (ref 11.5–15.5)
WBC: 5.3 10*3/uL (ref 4.0–10.5)

## 2012-12-25 LAB — POCT UA - MICROSCOPIC ONLY
Casts, Ur, LPF, POC: NEGATIVE
Crystals, Ur, HPF, POC: NEGATIVE
Yeast, UA: NEGATIVE

## 2012-12-25 LAB — POCT URINALYSIS DIPSTICK
Glucose, UA: NEGATIVE
Nitrite, UA: NEGATIVE
Protein, UA: NEGATIVE
Spec Grav, UA: >=1.03
Urobilinogen, UA: 0.2

## 2012-12-25 MED ORDER — OMEPRAZOLE 20 MG PO CPDR: 20.0000 mg | DELAYED_RELEASE_CAPSULE | Freq: Every day | ORAL | Status: DC

## 2012-12-25 NOTE — Progress Notes (Signed)
   Subjective:    Patient ID: [name], female    DOB: 26-Jul-1960, 52 y.o.   MRN: [id number]  HPI  Patient states she has a large bag and twisted her wrist and since that time she having some discomfort  Review of Systems  Constitutional: Positive for diaphoresis, activity change and fatigue.  Eyes: Positive for visual disturbance.  Respiratory: Positive for choking.   Musculoskeletal: Positive for arthralgias and myalgias.  Neurological: Positive for numbness and headaches.  Psychiatric/Behavioral: Positive for sleep disturbance. The patient is nervous/anxious.        Objective:   Physical Exam HEENT exam is unremarkable. Her neck is supple. Chest is clear to both auscultation and percussion. Heart regular rate no murmurs rubs or gallops appreciated. Abdomen is obese without tenderness. Extremities without cyanosis clubbing or edema. 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 Examination of the right hand reveals negative Tinel's and Phalen's. Negative Finkelstein. There is no decreased range of motion or areas of swelling  EKG flat T waves V5 and V6  [facility] reading (PRIMARY) by  Dr. Cleta Alberts normal      Assessment & Plan:  We'll proceed with cardiac evaluation. She will be scheduled for a colonoscopy. Patient referred for nutritional counseling. To start her on some Prilosec for reflux. Flu shot was given will give her a wrist splint to wear at night. The radiologist called back and said there is a subtle area of abnormality involving the scaphoid. Will call and talk to the patient to see if she wants to have scan of this area to be sure there's not a fracture.

## 2012-12-26 ENCOUNTER — Telehealth: Payer: Self-pay | Admitting: Family Medicine

## 2012-12-26 ENCOUNTER — Telehealth: Payer: Self-pay | Admitting: Radiology

## 2012-12-26 DIAGNOSIS — R9389 Abnormal findings on diagnostic imaging of other specified body structures: Secondary | ICD-10-CM

## 2012-12-26 LAB — COMPLETE METABOLIC PANEL WITHOUT GFR
ALT: 13 U/L (ref 0–35)
AST: 14 U/L (ref 0–37)
Albumin: 3.9 g/dL (ref 3.5–5.2)
Alkaline Phosphatase: 92 U/L (ref 39–117)
BUN: 19 mg/dL (ref 6–23)
CO2: 28 meq/L (ref 19–32)
Calcium: 9.6 mg/dL (ref 8.4–10.5)
Chloride: 105 meq/L (ref 96–112)
Creat: 0.81 mg/dL (ref 0.50–1.10)
GFR, Est African American: >89 mL/min
GFR, Est Non African American: 83 mL/min
Glucose, Bld: 99 mg/dL (ref 70–99)
Potassium: 4.6 meq/L (ref 3.5–5.3)
Sodium: 140 meq/L (ref 135–145)
Total Bilirubin: 0.5 mg/dL (ref 0.3–1.2)
Total Protein: 6.5 g/dL (ref 6.0–8.3)

## 2012-12-26 LAB — LIPID PANEL
Cholesterol: 217 mg/dL — ABNORMAL HIGH (ref 0–200)
HDL: 52 mg/dL
LDL Cholesterol: 140 mg/dL — ABNORMAL HIGH (ref 0–99)
Total CHOL/HDL Ratio: 4.2 ratio
Triglycerides: 126 mg/dL
VLDL: 25 mg/dL (ref 0–40)

## 2012-12-26 LAB — TSH: TSH: 3.14 u[IU]/mL (ref 0.350–4.500)

## 2012-12-26 LAB — VITAMIN D 25 HYDROXY (VIT D DEFICIENCY, FRACTURES): Vit D, 25-Hydroxy: 17 ng/mL — ABNORMAL LOW (ref 30–89)

## 2012-12-26 NOTE — Telephone Encounter (Signed)
Spoke with patient and she agrees with getting a CT of her hand.

## 2012-12-26 NOTE — Telephone Encounter (Signed)
Left a message for patient to return call about xray done on 12/25/2012.

## 2012-12-26 NOTE — Telephone Encounter (Signed)
Ct scan ordered to look for scaphoid fracture, needs peer to peer 1 [phone] option 3  to discuss case # [id number]  IMPRESSION:  Subtle linear lucency at the junction of the mid and distal thirds  of the scaphoid. This finding could indicate a prominent nutrient  foramen. A subtle fracture in this area, however, must be of concern  given this appearance. If patient has symptoms suggestive of of  trauma in this area, would advise dedicated wrist imaging to further  evaluate and consideration for immobilization.  (or it may be best to get patient in to the office for wrist xrays with scaphoid views)

## 2012-12-27 ENCOUNTER — Other Ambulatory Visit: Payer: Self-pay | Admitting: Family Medicine

## 2012-12-27 LAB — URINE CULTURE
Colony Count: NO GROWTH
Organism ID, Bacteria: NO GROWTH

## 2012-12-27 MED ORDER — ATORVASTATIN CALCIUM 20 MG PO TABS: 20.0000 mg | ORAL_TABLET | Freq: Every day | ORAL | Status: DC

## 2012-12-27 NOTE — Telephone Encounter (Signed)
Patient notified of labs and lipitor sent to [facility]

## 2012-12-28 ENCOUNTER — Ambulatory Visit: Payer: 59

## 2012-12-28 ENCOUNTER — Telehealth: Payer: Self-pay | Admitting: Emergency Medicine

## 2012-12-28 ENCOUNTER — Ambulatory Visit (INDEPENDENT_AMBULATORY_CARE_PROVIDER_SITE_OTHER): Payer: 59 | Admitting: Emergency Medicine

## 2012-12-28 DIAGNOSIS — R209 Unspecified disturbances of skin sensation: Secondary | ICD-10-CM

## 2012-12-28 DIAGNOSIS — R200 Anesthesia of skin: Secondary | ICD-10-CM

## 2012-12-28 DIAGNOSIS — R9389 Abnormal findings on diagnostic imaging of other specified body structures: Secondary | ICD-10-CM

## 2012-12-28 NOTE — Progress Notes (Signed)
   Subjective:    Patient ID: [name], female    DOB: [birth year], 52 y.o.   MRN: [id number]  HPI patient enters for followup of an abnormal hand film. The radiologist was concerned about a possible abnormal area of the scaphoid and recommended more complete imaging.    Review of Systems     Objective:   Physical Exam  [facility] reading (PRIMARY) by  Dr.Tarshia Kot no fracture is seen please comment on the scaphoid bone        Assessment & Plan:  No definite fracture seen of the scaphoid bone will make referral to [facility] orthopedics Dr. Amanda Pea  or Dr. Melvyn Novas

## 2012-12-28 NOTE — Telephone Encounter (Signed)
This note was on the previous patient. Regarding the knee. I will make the call today and see if we can get approval if we cannot then I will refer her to the orthopedist for their evaluation

## 2012-12-28 NOTE — Telephone Encounter (Signed)
He can call his back and we can schedule the MRI if his knee pain returns

## 2012-12-28 NOTE — Telephone Encounter (Signed)
Thanks. Discussed the imaging needed with Dr Cleta Alberts, patient should come in for wrist xrays/ scaphoid view we can fast track for xray only. If xray continues to demonstrate an abnormal scaphoid, we can proceed with the CT scan.

## 2013-01-29 ENCOUNTER — Telehealth: Payer: Self-pay

## 2013-01-29 NOTE — Telephone Encounter (Signed)
Patient calling to ask if we can mail her labs from 12/25/12 to her home address, which I verified with her and is correct.   Thank you!  [phone]

## 2013-01-30 NOTE — Telephone Encounter (Signed)
Mailed

## 2013-02-28 NOTE — Telephone Encounter (Signed)
A user error has taken place: encounter opened in error, closed for administrative reasons.

## 2013-03-20 ENCOUNTER — Ambulatory Visit: Payer: 59 | Admitting: Neurology

## 2013-04-03 ENCOUNTER — Ambulatory Visit: Payer: 59 | Admitting: Neurology

## 2013-04-05 ENCOUNTER — Telehealth: Payer: Self-pay

## 2013-04-05 NOTE — Telephone Encounter (Signed)
Pt would like to change from the current cholesterol medication that she is on because it is causing her to be sore all over.please advise. Best# [phone]

## 2013-04-07 NOTE — Telephone Encounter (Signed)
Pt is currently on Lipitor 20mg  q day. Started 12/14 Please advise an alternative or if you would like to see pt.

## 2013-04-08 MED ORDER — ROSUVASTATIN CALCIUM 5 MG PO TABS: ORAL_TABLET | ORAL | Status: DC

## 2013-04-08 NOTE — Telephone Encounter (Signed)
Change to Crestor 5 mg and have her take the medication every other day. Call in #30 tablets with 5 refills

## 2013-04-08 NOTE — Telephone Encounter (Signed)
Discontinued Lipitor- Change in Therapy New prescription sent to the pharmacy. Patient notified verbalized understanding in changes made.

## 2013-04-10 ENCOUNTER — Ambulatory Visit (INDEPENDENT_AMBULATORY_CARE_PROVIDER_SITE_OTHER): Payer: 59 | Admitting: Neurology

## 2013-04-10 ENCOUNTER — Encounter: Payer: Self-pay | Admitting: Neurology

## 2013-04-10 ENCOUNTER — Encounter (INDEPENDENT_AMBULATORY_CARE_PROVIDER_SITE_OTHER): Payer: Self-pay

## 2013-04-10 VITALS — BP 121/79 | HR 89 | Ht 65.0 in | Wt 237.0 lb

## 2013-04-10 DIAGNOSIS — G43909 Migraine, unspecified, not intractable, without status migrainosus: Secondary | ICD-10-CM

## 2013-04-10 DIAGNOSIS — G43719 Chronic migraine without aura, intractable, without status migrainosus: Secondary | ICD-10-CM

## 2013-04-10 MED ORDER — ONABOTULINUMTOXINA 100 UNITS IJ SOLR
200.0000 [IU] | Freq: Once | INTRAMUSCULAR | Status: AC
  Administered 2013-04-10: 200 [IU] via INTRAMUSCULAR

## 2013-04-10 NOTE — Progress Notes (Signed)
History of Present Illness:   [name] is a 53 year old Caucasian female, came in for Botox injection for chronic migraine headaches.  HISTORY: She has a history of chronic migraine headaches for a long time, increased since 2008, her migraine usually start at one side, occipital region, spreading forward, throbbing severe headache, with smell and light and noise sensitivity,  lasting 2-3 days  About half times in a month, she would have some degree of headaches, out of those headaches, about 2-5 are severe long-lasting headache, lasting about 2-3 days,  over the past few years, she has tried different preventive medications, Topamax, cause confusion, difficulty talking, propanolol, she could not tolerate, and doesn't help, antidepression-excessive fatigue, no help.  She was able to identify trigger of her migraine, stress, menstruation period of time, sometimes woke up with severe headache,  she is currently taking Frova and Imitrex tablets, which helped her majority of the time, this is her first Botox injection as migraine prevention  She responded very well to last Botox injection, she began to notice increased frequency of headaches since June 2014, about once a week, at the maximum benefit to Botox, she only has one to 2 headaches each month, Frova has always been able to take care of her headache, but she usually tried Imitrex tablets first, 20% of time she has to take second tablet by the afternoon, she previously responded to Imitrex subcutaneous injection very well, she worried about losing benefit of Frova with repeat use.  UPDATE 12/19/2012: She is doing well with Imitrex injection, she has total 12 headaches in past 3 months, She still travels a lot, also taking care of her elderly parents,   Review of Systems  Out of a complete 14 system review, the patient complains of only the following symptoms, and all other reviewed systems are negative.   Social History The patient is divorced.  Patient has no children. Patient has a high school education. Patient works for [employer].  Patient does not smoke or drink. Patient states intolerance to codeine.  Inhaled Tobacco Use: never smoker  Family History Both parents are still living. There is a family history of diabetes. Patient has one brother. No family history of headache is noted with exception of one grandmother. Sister has a history of early diabetes. Both parents have diabetes.  Father has a history of Alzheimer's disease.   Past Medical History One. Migraine headache 2. Mild obesity 3. Bilateral tubal ligation  4. Left Achilles tendon surgery  Physical Exam  General: This patient is alert and cooperative at the time of the examination. Patient is minimally obese. Skin: No significant peripheral edema is noted.  Neurologic Exam  Cranial Nerves: Facial symmetry is present.  Speech is normal, no aphasia or dysarthria is noted.  Extraocular movements are full.  Visual fields are full.   Motor: Patient has good strength in all 4 extremities. Coordination: Patient has good finger-to-nose and heel-to-shin bilaterally. Gait and Station: Patient has a normal gait.  Tandem gait is normal.  Romberg is negative.  No drift is seen.   Reflexes: Deep tendon reflexes are symmetric.   Assessment and Plan:  BOTOX injection was performed according to protocol by [employer]. 100 units/2 cc NS. (Lot [id number], exp April 2017)   Frontalis 4 sites,  20 units, Temporalis 10 sites,  50 units  Occipitalis 6 sites, 30 units Cervical Paraspinal, 8 sites, 40 units Trapezius, 12 sites, 60 units   Patient tolerate the injection well. Will return for repeat injection  in 3 months.

## 2013-05-23 DIAGNOSIS — I872 Venous insufficiency (chronic) (peripheral): Secondary | ICD-10-CM

## 2013-05-23 HISTORY — DX: Venous insufficiency (chronic) (peripheral): I87.2

## 2013-07-09 ENCOUNTER — Encounter: Payer: Self-pay | Admitting: Family Medicine

## 2013-07-09 DIAGNOSIS — I872 Venous insufficiency (chronic) (peripheral): Secondary | ICD-10-CM

## 2013-07-10 ENCOUNTER — Telehealth: Payer: Self-pay | Admitting: Neurology

## 2013-07-10 ENCOUNTER — Encounter: Payer: Self-pay | Admitting: Neurology

## 2013-07-10 ENCOUNTER — Encounter (INDEPENDENT_AMBULATORY_CARE_PROVIDER_SITE_OTHER): Payer: Self-pay

## 2013-07-10 ENCOUNTER — Ambulatory Visit (INDEPENDENT_AMBULATORY_CARE_PROVIDER_SITE_OTHER): Payer: 59 | Admitting: Neurology

## 2013-07-10 DIAGNOSIS — G43719 Chronic migraine without aura, intractable, without status migrainosus: Secondary | ICD-10-CM

## 2013-07-10 DIAGNOSIS — E7800 Pure hypercholesterolemia, unspecified: Secondary | ICD-10-CM

## 2013-07-10 DIAGNOSIS — G43909 Migraine, unspecified, not intractable, without status migrainosus: Secondary | ICD-10-CM

## 2013-07-10 DIAGNOSIS — R0789 Other chest pain: Secondary | ICD-10-CM

## 2013-07-10 MED ORDER — SUMATRIPTAN SUCCINATE 6 MG/0.5ML ~~LOC~~ SOLN: 6.0000 mg | SUBCUTANEOUS | Status: DC | PRN

## 2013-07-10 NOTE — Progress Notes (Signed)
History of Present Illness:   [name] is a 53 year old Caucasian female, came in for Botox injection for chronic migraine headaches.  HISTORY: She has a history of chronic migraine headaches for a long time, increased since 2008, her migraine usually start at one side, occipital region, spreading forward, throbbing severe headache, with smell and light and noise sensitivity,  lasting 2-3 days  About half times in a month, she would have some degree of headaches, out of those headaches, about 2-5 are severe long-lasting headache, lasting about 2-3 days,  over the past few years, she has tried different preventive medications, Topamax, cause confusion, difficulty talking, propanolol, she could not tolerate, and doesn't help, antidepression-excessive fatigue, no help.  She was able to identify trigger of her migraine, stress, menstruation period of time, sometimes woke up with severe headache,  she is currently taking Frova and Imitrex tablets, which helped her majority of the time, this is her first Botox injection as migraine prevention  She responded very well to last Botox injection, she began to notice increased frequency of headaches since June 2014, about once a week, at the maximum benefit to Botox, she only has one to 2 headaches each month, Frova has always been able to take care of her headache, but she usually tried Imitrex tablets first, 20% of time she has to take second tablet by the afternoon, she previously responded to Imitrex subcutaneous injection very well, she worried about losing benefit of Frova with repeat use.  UPDATE June 24th 2015: She is doing well with Imitrex injection, she still has some recurrent headaches in past 3 months, responding well with imitrex injections.   Review of Systems  Out of a complete 14 system review, the patient complains of only the following symptoms, and all other reviewed systems are negative.   Social History The patient is divorced. Patient  has no children. Patient has a high school education. Patient works for [employer].  Patient does not smoke or drink. Patient states intolerance to codeine.  Inhaled Tobacco Use: never smoker  Family History Both parents are still living. There is a family history of diabetes. Patient has one brother. No family history of headache is noted with exception of one grandmother. Sister has a history of early diabetes. Both parents have diabetes.  Father has a history of Alzheimer's disease.   Past Medical History One. Migraine headache 2. Mild obesity 3. Bilateral tubal ligation  4. Left Achilles tendon surgery  Physical Exam  General: This patient is alert and cooperative at the time of the examination. Patient is minimally obese. Skin: No significant peripheral edema is noted.  Neurologic Exam  Cranial Nerves: Facial symmetry is present.  Speech is normal, no aphasia or dysarthria is noted.  Extraocular movements are full.  Visual fields are full.   Motor: Patient has good strength in all 4 extremities. Coordination: Patient has good finger-to-nose and heel-to-shin bilaterally. Gait and Station: Patient has a normal gait.  Tandem gait is normal.  Romberg is negative.  No drift is seen.   Reflexes: Deep tendon reflexes are symmetric.   Assessment and Plan:  BOTOX injection was performed according to protocol by [facility].  Used 200 units   Right corrugate 2.5 units Left corrugate 2.5 units Procerus 5  Frontalis 4 sites,  10 units, Temporalis 10 sites,  50 units  Occipitalis 6 sites, 30 units Cervical Paraspinal, 8 sites, 40 units Trapezius, 12 sites, 60 units   Patient tolerate the injection well. Will return for  repeat injection in 3 months.

## 2013-07-10 NOTE — Telephone Encounter (Signed)
[name], please verify with her the correct pharmacy to call in her imitrex injection

## 2013-07-10 NOTE — Telephone Encounter (Signed)
I called the patient.  Got no answer.  Left message asking that she call back to verify Pharmacy info.

## 2013-07-12 MED ORDER — SUMATRIPTAN SUCCINATE 6 MG/0.5ML ~~LOC~~ SOLN: 6.0000 mg | SUBCUTANEOUS | Status: DC | PRN

## 2013-07-12 NOTE — Telephone Encounter (Signed)
I called again.  Spoke with the patient.  Says she uses [facility].  I have resent the Rx.

## 2013-09-09 ENCOUNTER — Other Ambulatory Visit: Payer: Self-pay | Admitting: Emergency Medicine

## 2013-10-16 ENCOUNTER — Ambulatory Visit: Payer: 59 | Admitting: Neurology

## 2013-12-10 ENCOUNTER — Ambulatory Visit (INDEPENDENT_AMBULATORY_CARE_PROVIDER_SITE_OTHER): Payer: 59 | Admitting: Family Medicine

## 2013-12-10 VITALS — BP 122/88 | HR 96 | Temp 98.5°F | Resp 18 | Ht 68.0 in | Wt 238.8 lb

## 2013-12-10 DIAGNOSIS — R059 Cough, unspecified: Secondary | ICD-10-CM

## 2013-12-10 DIAGNOSIS — R5383 Other fatigue: Secondary | ICD-10-CM

## 2013-12-10 DIAGNOSIS — J00 Acute nasopharyngitis [common cold]: Secondary | ICD-10-CM

## 2013-12-10 DIAGNOSIS — R5381 Other malaise: Secondary | ICD-10-CM

## 2013-12-10 DIAGNOSIS — R05 Cough: Secondary | ICD-10-CM

## 2013-12-10 DIAGNOSIS — I8290 Acute embolism and thrombosis of unspecified vein: Secondary | ICD-10-CM | POA: Insufficient documentation

## 2013-12-10 DIAGNOSIS — J208 Acute bronchitis due to other specified organisms: Principal | ICD-10-CM

## 2013-12-10 DIAGNOSIS — B9689 Other specified bacterial agents as the cause of diseases classified elsewhere: Secondary | ICD-10-CM

## 2013-12-10 DIAGNOSIS — J029 Acute pharyngitis, unspecified: Secondary | ICD-10-CM

## 2013-12-10 HISTORY — DX: Acute embolism and thrombosis of unspecified vein: I82.90

## 2013-12-10 LAB — POCT RAPID STREP A (OFFICE): Rapid Strep A Screen: NEGATIVE

## 2013-12-10 LAB — POCT INFLUENZA A/B
Influenza A, POC: NEGATIVE
Influenza B, POC: NEGATIVE

## 2013-12-10 MED ORDER — BENZONATATE 100 MG PO CAPS: 100.0000 mg | ORAL_CAPSULE | Freq: Three times a day (TID) | ORAL | Status: DC | PRN

## 2013-12-10 MED ORDER — CEFDINIR 300 MG PO CAPS: 300.0000 mg | ORAL_CAPSULE | Freq: Two times a day (BID) | ORAL | Status: DC

## 2013-12-10 NOTE — Progress Notes (Signed)
[facility] [address], [place] [address] [phone]  Date:  12/10/2013   Name:  [name]   DOB:  December 18, 1960   MRN:  [id number]  PCP:  Lucilla EdinAUB, STEVE A, MD    Chief Complaint: Nasal Congestion; Headache; Cough; Sore Throat; and Fever   History of Present Illness:  [name] is a 53 y.o. very pleasant female patient who presents with the following:  Here today with illness.  Today is Tuesday- she feels "like I've been hit by a truck."  She noted onset of sx about 3 weeks ago with cough and chest congesttion.  She got better but then her sx returned over the weekend.   At this point she notes sinus congestion, ear pain, dry cough during the day "I can't stop coughing."  She will cough up mucus in the am.   Her throat feels raw from coughing.   On satureday she felt fecverish.   She had body aches and chills over the last few days but not today  No GI symtpoms.  No antipyretics this am.   She feels like she might have the flu.   She is on xarelto for a small clot in her left leg.  She was on treatemnt for varicose veins and   Patient Active Problem List   Diagnosis Date Noted  . Chronic venous insufficiency 05/23/2013  . Migraine, unspecified, without mention of intractable migraine without mention of status migrainosus 09/13/2012  . Atypical chest pain 08/24/2011  . Hypercholesterolemia 08/24/2011  . Family history of premature coronary artery disease 08/24/2011    Past Medical History  Diagnosis Date  . Allergy   . Migraine   . Chest tightness   . Hyperlipidemia   . Anxiety   . Depression     Past Surgical History  Procedure Laterality Date  . Tubal ligation  2006  . Vein ligation Bilateral     History  Substance Use Topics  . Smoking status: Never Smoker   . Smokeless tobacco: Never Used  . Alcohol Use: 0.6 oz/week    1 Glasses of wine per week     Comment: OCC    Family History  Problem Relation Age of Onset  .  Diabetes Mother 870    type 2  . Diabetes Father 4268    type 2  . Dementia Father 480  . Hyperlipidemia Father   . Diabetes Sister   . Hyperlipidemia Sister   . Hypertension Sister   . Diabetes Brother   . Heart disease Brother   . Hyperlipidemia Brother   . Hypertension Brother     Allergies  Allergen Reactions  . Codeine Hives    rash    Medication list has been reviewed and updated.  Current Outpatient Prescriptions on File Prior to Visit  Medication Sig Dispense Refill  . aspirin 81 MG tablet Take 81 mg by mouth daily.    . Cholecalciferol (VITAMIN D3) 1000 UNITS CAPS Take 1,000 Int'l Units by mouth daily.    . frovatriptan (FROVA) 2.5 MG tablet Take 1 tablet (2.5 mg total) by mouth as needed for migraine. If recurs, may repeat after 2 hours. Max of 3 tabs in 24 hours. 27 tablet 3  . Omega-3 Fatty Acids (FISH OIL PO) Take 1,000 mg by mouth. Mega Red 300 MG Daily    . omeprazole (PRILOSEC) 20 MG capsule Take 1 capsule (20 mg total) by mouth daily. PATIENT NEEDS OFFICE VISIT FOR  ADDITIONAL REFILLS 30 capsule 0  . OVER THE COUNTER MEDICATION OTC garlic 1000 mg taking daily    . OVER THE COUNTER MEDICATION Melantonin 2 mg taking occas    . PRESCRIPTION MEDICATION Every 3-5 months patient has Botox injection for migraine    . rosuvastatin (CRESTOR) 5 MG tablet Take one tablet every other day 30 tablet 5  . SUMAtriptan (IMITREX) 6 MG/0.5ML SOLN injection Inject 0.5 mLs (6 mg total) into the skin as needed for migraine or headache. May repeat in 2 hours if needed 36 vial 3  . vitamin E (VITAMIN E) 1000 UNIT capsule Take 1,000 Units by mouth daily.    Carlena Hurl. XARELTO 20 MG TABS tablet 20 mg daily.    Marland Kitchen. atorvastatin (LIPITOR) 20 MG tablet Take 20 mg by mouth daily.    . cetirizine (ZYRTEC) 10 MG tablet Take 10 mg by mouth as needed.      No current facility-administered medications on file prior to visit.    Review of Systems:  As per HPI- otherwise negative.   Physical  Examination: Filed Vitals:   12/10/13 0819  BP: 122/88  Pulse: 96  Temp: 98.5 F (36.9 C)  Resp: 18   Filed Vitals:   12/10/13 0819  Height: 5\' 8"  (1.727 m)  Weight: 238 lb 12.8 oz (108.319 kg)   Body mass index is 36.32 kg/(m^2). Ideal Body Weight: Weight in (lb) to have BMI = 25: 164.1  GEN: WDWN, NAD, Non-toxic, A & O x 3, obese, looks well HEENT: Atraumatic, Normocephalic. Neck supple. No masses, No LAD.  Bilateral TM wnl, oropharynx shows injection and possible exudate on the right.  PEERL,EOMI.   Ears and Nose: No external deformity. CV: RRR, No M/G/R. No JVD. No thrill. No extra heart sounds. PULM: CTA B, no wheezes, crackles, rhonchi. No retractions. No resp. distress. No accessory muscle use. ABD: S, NT, ND EXTR: No c/c/e NEURO Normal gait.  PSYCH: Normally interactive. Conversant. Not depressed or anxious appearing.  Calm demeanor.   Results for orders placed or performed in visit on 12/10/13  POCT Influenza A/B  Result Value Ref Range   Influenza A, POC Negative    Influenza B, POC Negative   POCT rapid strep A  Result Value Ref Range   Rapid Strep A Screen Negative Negative    Assessment and Plan: Acute bronchitis, bacterial - Plan: cefdinir (OMNICEF) 300 MG capsule  Malaise and fatigue - Plan: POCT Influenza A/B  Cough - Plan: benzonatate (TESSALON) 100 MG capsule  ST (sore throat) - Plan: POCT rapid strep A  Venous thrombosis  Treat for persistent illness x 3 weeks with omnicef and tessalon as needed She will let me know if not feeling better soon- Sooner if worse.     Signed Abbe AmsterdamJessica Copland, MD

## 2013-12-10 NOTE — Patient Instructions (Signed)
We are going to treat you for a sinus infection and bronchitis. Use the omnicef antibiotic and the tessalon perles as needed for cough Let me know if you do not feel better in a few days- Sooner if worse.

## 2013-12-13 ENCOUNTER — Ambulatory Visit (INDEPENDENT_AMBULATORY_CARE_PROVIDER_SITE_OTHER): Payer: 59 | Admitting: Emergency Medicine

## 2013-12-13 VITALS — BP 128/84 | HR 93 | Temp 98.2°F | Resp 18 | Ht 68.0 in | Wt 238.0 lb

## 2013-12-13 DIAGNOSIS — J209 Acute bronchitis, unspecified: Secondary | ICD-10-CM

## 2013-12-13 MED ORDER — CLARITHROMYCIN 500 MG PO TABS: 500.0000 mg | ORAL_TABLET | Freq: Two times a day (BID) | ORAL | Status: DC

## 2013-12-13 NOTE — Patient Instructions (Signed)

## 2013-12-13 NOTE — Progress Notes (Signed)
[facility] [address], [place] [address] [phone]  Date:  12/13/2013   Name:  [name]   DOB:  08-03-1960   MRN:  [id number]  PCP:  Lucilla EdinAUB, STEVE A, MD    Chief Complaint: Cough and Otalgia   History of Present Illness:  [name] is a 53 y.o. very pleasant female patient who presents with the following:  Seen Tuesday and treated for URI with omnicef Continues to cough and has purulent sputum.  Some nighttime wheezing. No history of asthma.  No shortess of breath Has sore throat.  Nasal congestion with no drainage. No nausea or vomiting. No improvement with over the counter medications or other home remedies.  Denies other complaint or health concern today.   Patient Active Problem List   Diagnosis Date Noted  . 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 Chronic venous insufficiency 05/23/2013  . Migraine, unspecified, without mention of intractable migraine without mention of status migrainosus 09/13/2012  . Atypical chest pain 08/24/2011  . Hypercholesterolemia 08/24/2011  . Family history of premature coronary artery disease 08/24/2011    Past Medical History  Diagnosis Date  . Allergy   . Migraine   . Chest tightness   . Hyperlipidemia   . Anxiety   . Depression     Past Surgical History  Procedure Laterality Date  . Tubal ligation  2006  . Vein ligation Bilateral     History  Substance Use Topics  . Smoking status: Never Smoker   . Smokeless tobacco: Never Used  . Alcohol Use: 0.6 oz/week    1 Glasses of wine per week     Comment: OCC    Family History  Problem Relation Age of Onset  . Diabetes Mother 2670    type 2  . Diabetes Father 2868    type 2  . Dementia Father 6180  . Hyperlipidemia Father   . Diabetes Sister   . Hyperlipidemia Sister   . Hypertension Sister   . Diabetes Brother   . Heart disease Brother   . Hyperlipidemia Brother   . Hypertension Brother     Allergies  Allergen Reactions  . Codeine  Hives    rash    Medication list has been reviewed and updated.  Current Outpatient Prescriptions on File Prior to Visit  Medication Sig Dispense Refill  . aspirin 81 MG tablet Take 81 mg by mouth daily.    Marland Kitchen. atorvastatin (LIPITOR) 20 MG tablet Take 20 mg by mouth daily.    . cefdinir (OMNICEF) 300 MG capsule Take 1 capsule (300 mg total) by mouth 2 (two) times daily. 20 capsule 0  . cetirizine (ZYRTEC) 10 MG tablet Take 10 mg by mouth as needed.     . Cholecalciferol (VITAMIN D3) 1000 UNITS CAPS Take 1,000 Int'l Units by mouth daily.    . frovatriptan (FROVA) 2.5 MG tablet Take 1 tablet (2.5 mg total) by mouth as needed for migraine. If recurs, may repeat after 2 hours. Max of 3 tabs in 24 hours. 27 tablet 3  . Omega-3 Fatty Acids (FISH OIL PO) Take 1,000 mg by mouth. Mega Red 300 MG Daily    . omeprazole (PRILOSEC) 20 MG capsule Take 1 capsule (20 mg total) by mouth daily. PATIENT NEEDS OFFICE VISIT FOR ADDITIONAL REFILLS 30 capsule 0  . OVER THE COUNTER MEDICATION OTC garlic 1000 mg taking daily    . OVER THE COUNTER MEDICATION Melantonin 2 mg taking occas    .  PRESCRIPTION MEDICATION Every 3-5 months patient has Botox injection for migraine    . rosuvastatin (CRESTOR) 5 MG tablet Take one tablet every other day 30 tablet 5  . SUMAtriptan (IMITREX) 6 MG/0.5ML SOLN injection Inject 0.5 mLs (6 mg total) into the skin as needed for migraine or headache. May repeat in 2 hours if needed 36 vial 3  . vitamin E (VITAMIN E) 1000 UNIT capsule Take 1,000 Units by mouth daily.    Carlena Hurl. XARELTO 20 MG TABS tablet 20 mg daily.    . benzonatate (TESSALON) 100 MG capsule Take 1 capsule (100 mg total) by mouth 3 (three) times daily as needed for cough. (Patient not taking: Reported on 12/13/2013) 40 capsule 0   No current facility-administered medications on file prior to visit.    Review of Systems:  As per HPI, otherwise negative.    Physical Examination: Filed Vitals:   12/13/13 0850  BP: 128/84   Pulse: 93  Temp: 98.2 F (36.8 C)  Resp: 18   Filed Vitals:   12/13/13 0850  Height: 5\' 8"  (1.727 m)  Weight: 238 lb (107.956 kg)   Body mass index is 36.2 kg/(m^2). Ideal Body Weight: Weight in (lb) to have BMI = 25: 164.1  GEN: [place], NAD, Non-toxic, A & O x 3 HEENT: Atraumatic, Normocephalic. Neck supple. No masses, No LAD. Ears and Nose: No external deformity. CV: RRR, No M/G/R. No JVD. No thrill. No extra heart sounds. PULM: CTA B, no wheezes, crackles, rhonchi. No retractions. No resp. distress. No accessory muscle use. ABD: S, NT, ND, +BS. No rebound. No HSM. EXTR: No c/c/e NEURO Normal gait.  PSYCH: Normally interactive. Conversant. Not depressed or anxious appearing.  Calm demeanor.    Assessment and Plan: Bronchitis biaxin tussionex  Signed,  Phillips OdorJeffery Timia Casselman, MD

## 2013-12-26 ENCOUNTER — Other Ambulatory Visit: Payer: Self-pay | Admitting: Neurology

## 2013-12-26 ENCOUNTER — Other Ambulatory Visit (INDEPENDENT_AMBULATORY_CARE_PROVIDER_SITE_OTHER): Payer: Self-pay

## 2013-12-26 ENCOUNTER — Other Ambulatory Visit (INDEPENDENT_AMBULATORY_CARE_PROVIDER_SITE_OTHER): Payer: Self-pay | Admitting: Surgery

## 2014-01-25 ENCOUNTER — Encounter: Payer: 59 | Attending: Surgery | Admitting: Dietician

## 2014-01-25 ENCOUNTER — Encounter: Payer: Self-pay | Admitting: Dietician

## 2014-01-25 DIAGNOSIS — Z6841 Body Mass Index (BMI) 40.0 and over, adult: Secondary | ICD-10-CM | POA: Insufficient documentation

## 2014-01-25 DIAGNOSIS — Z713 Dietary counseling and surveillance: Secondary | ICD-10-CM | POA: Insufficient documentation

## 2014-01-25 NOTE — Patient Instructions (Signed)
Follow Pre-Op Goals Try Protein Shakes Call [facility] at [phone] when surgery is scheduled to enroll in Pre-Op Class  Things to remember:  Please always be honest with us. We want to support you!  If you have any questions or concerns in between appointments, please call or email Jennette BankerLiz, Zykira Matlack, or Jacki ConesLaurie.  The diet after surgery will be high protein and low in carbohydrate.  Vitamins and calcium need to be taken for the rest of your life.  Feel free to include support people in any classes or appointments.  2 doses of a complete multivitamin 1500 mg Calcium citrate (500 3x a day) Vitamin B12 (not orally)   [email or web address] (fill out registration page)

## 2014-01-25 NOTE — Progress Notes (Signed)
  Pre-Op Assessment Visit:  Pre-Operative Gastric Sleeve Surgery  Medical Nutrition Therapy:  Appt start time: 0800   End time:  0900.  Patient was seen on 01/25/14 for Pre-Operative Nutrition Assessment. Assessment and letter of approval [facility] coordinator on 01/25/14.   Preferred Learning Style:   No preference indicated   Learning Readiness:   Ready  Handouts given during visit include:  Pre-Op Goals Bariatric Surgery Protein Shakes  Samples provided and patient educated on proper use:  Unjury protein powder (chicken soup flavor - qty 1) Lot#: [id number] Exp: 03/2014  Unjury protein powder (unflavored - qty 1) Lot#: [id number] Exp: 11/2014  Premier protein shake (strawberry - qty 1) Lot#: [id number] Exp: 08/2014  Premier protein shake (chocolate - qty 1) Lot#: [id number] Exp: 09/2014  During the appointment today the following Pre-Op Goals were reviewed with the patient: Maintain or lose weight as instructed by your surgeon Make healthy food choices Begin to limit portion sizes Limited concentrated sugars and fried foods Keep fat/sugar in the single digits per serving on   food labels Practice CHEWING your food  (aim for 30 chews per bite or until applesauce consistency) Practice not drinking 15 minutes before, during, and 30 minutes after each meal/snack Avoid all carbonated beverages  Avoid/limit caffeinated beverages  Avoid all sugar-sweetened beverages Consume 3 meals per day; eat every 3-5 hours Make a list of non-food related activities Aim for 64-100 ounces of FLUID daily  Aim for at least 60-80 grams of PROTEIN daily Look for a liquid protein source that contain ?15 g protein and ?5 g carbohydrate  (ex: shakes, drinks, shots)  Patient-Centered Goals: Resolved knee and hip pain, biking, hiking, overall more active Confidence/importance scale of 1-10: confidence (10) / importance (10)  Demonstrated degree of  understanding via:  Teach Back  Teaching Method Utilized:  Visual Auditory Hands on  Barriers to learning/adherence to lifestyle change: job requires 85% travel  Patient to call the [facility] to enroll in Pre-Op and Post-Op Nutrition Education when surgery date is scheduled.

## 2014-01-31 ENCOUNTER — Ambulatory Visit (HOSPITAL_COMMUNITY): Payer: BC Managed Care – PPO

## 2014-01-31 ENCOUNTER — Other Ambulatory Visit (HOSPITAL_COMMUNITY): Payer: BC Managed Care – PPO

## 2014-02-04 ENCOUNTER — Ambulatory Visit (HOSPITAL_COMMUNITY)
Admission: RE | Admit: 2014-02-04 | Discharge: 2014-02-04 | Disposition: A | Discharge status: Home / Self Care | Payer: 59 | Source: Ambulatory Visit | Attending: Surgery | Admitting: Surgery

## 2014-02-04 ENCOUNTER — Other Ambulatory Visit: Payer: Self-pay

## 2014-02-04 DIAGNOSIS — Z13820 Encounter for screening for osteoporosis: Secondary | ICD-10-CM | POA: Diagnosis not present

## 2014-02-04 DIAGNOSIS — K449 Diaphragmatic hernia without obstruction or gangrene: Secondary | ICD-10-CM | POA: Insufficient documentation

## 2014-02-04 DIAGNOSIS — K7689 Other specified diseases of liver: Secondary | ICD-10-CM | POA: Insufficient documentation

## 2014-02-04 DIAGNOSIS — Z6841 Body Mass Index (BMI) 40.0 and over, adult: Secondary | ICD-10-CM | POA: Diagnosis not present

## 2014-03-07 ENCOUNTER — Encounter: Payer: 59 | Attending: Surgery | Admitting: Dietician

## 2014-03-07 DIAGNOSIS — Z6841 Body Mass Index (BMI) 40.0 and over, adult: Secondary | ICD-10-CM | POA: Diagnosis not present

## 2014-03-07 DIAGNOSIS — Z713 Dietary counseling and surveillance: Secondary | ICD-10-CM | POA: Diagnosis not present

## 2014-03-07 NOTE — Progress Notes (Signed)
  Supervised Weight Loss:  Appt start time: 0855 end time:  0910  SWL visit 1:  Primary concerns today: [name] returns for her 1st SWL visit in preparation for bariatric surgery having lost 2 pounds. She seems motivated to develop healthy habits prior to having surgery. She has started receiving Nascobal and plans to begin vitamin regimen. She also tried [employer] protein shakes and likes them. [name] reports she has been focusing on protein foods like eggs, cheese sticks, grilled chicken, and [place] yogurt. Really working on eating at least 3x a day. Has been very mindful of food labels, especially trying to limit sugar. Chewing thoroughly, eating slowly, and working on avoiding drinking while eating. She is finding that this is helping with her acid reflux.   Goal: Continue to practice pre op goals.  Weight: 238.8 lbs BMI: 39.8  MEDICATIONS: see list  Recent physical activity: did not assess today  Estimated energy needs: 1400-1600 calories  Progress Towards Goal(s):  In progress.   Nutritional Diagnosis:  [place]-3.3 Overweight/obesity related to past poor dietary habits and physical inactivity as evidenced by patient in SWL for pending bariatric surgery following dietary guidelines for continued weight loss.     Intervention:  Nutrition counseling provided.   Monitoring/Evaluation:  Dietary intake, exercise, pre op goals, and body weight in 4 week(s).

## 2014-03-08 LAB — PROTIME-INR

## 2014-04-04 ENCOUNTER — Encounter: Payer: 59 | Attending: Surgery | Admitting: Dietician

## 2014-04-04 DIAGNOSIS — Z713 Dietary counseling and surveillance: Secondary | ICD-10-CM | POA: Diagnosis not present

## 2014-04-04 DIAGNOSIS — Z6841 Body Mass Index (BMI) 40.0 and over, adult: Secondary | ICD-10-CM | POA: Insufficient documentation

## 2014-04-04 NOTE — Progress Notes (Signed)
  Supervised Weight Loss:  Appt start time: 810 end time:  825  SWL visit 2:  Primary concerns today: [name] returns for her 2nd SWL visit in preparation for bariatric surgery having gained 1 pound. She has been traveling a lot in the past few weeks. However, she has been walking more, aims for 30 minutes a day. Ordered an Unjury starter pack from [employer] and tried the unflavored in yogurt. Working on reducing portions. Eating yogurt for breakfast, an apple and nuts for snack, salads or grilled chicken for lunch, and yogurt or something light for dinner. Trying to drink only water.   Goal: Continue to practice pre op goals.  Weight: 239.9 lbs BMI: 40  MEDICATIONS: see list  Recent physical activity: did not assess today  Estimated energy needs: 1400-1600 calories  Progress Towards Goal(s):  In progress.   Nutritional Diagnosis:  [place]-3.3 Overweight/obesity related to past poor dietary habits and physical inactivity as evidenced by patient in SWL for pending bariatric surgery following dietary guidelines for continued weight loss.     Intervention:  Nutrition counseling provided.   Monitoring/Evaluation:  Dietary intake, exercise, pre op goals, and body weight in 4 week(s).

## 2014-04-15 ENCOUNTER — Ambulatory Visit: Payer: Self-pay | Admitting: Emergency Medicine

## 2014-05-02 ENCOUNTER — Encounter: Payer: 59 | Attending: Surgery | Admitting: Dietician

## 2014-05-02 DIAGNOSIS — Z713 Dietary counseling and surveillance: Secondary | ICD-10-CM | POA: Insufficient documentation

## 2014-05-02 DIAGNOSIS — Z6841 Body Mass Index (BMI) 40.0 and over, adult: Secondary | ICD-10-CM | POA: Insufficient documentation

## 2014-05-02 NOTE — Progress Notes (Signed)
  Supervised Weight Loss:  Appt start time: 805 end time:  820  SWL visit 3:  Primary concerns today: [name] returns for her 3rd SWL visit in preparation for bariatric surgery having lost 3 pounds. She reports she has been trying to be mindful of what her lifestyle will be like after surgery. Trying to choose foods that are recommended and has been trying more protein shakes.   Goal: Continue to practice pre op goals.  Weight: 237.1 lbs BMI: 40.2  MEDICATIONS: see list  Recent physical activity: did not assess today  Estimated energy needs: 1400-1600 calories  Progress Towards Goal(s):  In progress.   Nutritional Diagnosis:  [place]-3.3 Overweight/obesity related to past poor dietary habits and physical inactivity as evidenced by patient in SWL for pending bariatric surgery following dietary guidelines for continued weight loss.     Intervention:  Nutrition counseling provided.   Monitoring/Evaluation:  Dietary intake, exercise, pre op goals, and body weight in 4 week(s).

## 2014-05-30 ENCOUNTER — Encounter: Payer: 59 | Attending: Surgery | Admitting: Dietician

## 2014-05-30 DIAGNOSIS — Z6841 Body Mass Index (BMI) 40.0 and over, adult: Secondary | ICD-10-CM | POA: Insufficient documentation

## 2014-05-30 DIAGNOSIS — Z713 Dietary counseling and surveillance: Secondary | ICD-10-CM | POA: Diagnosis not present

## 2014-05-30 NOTE — Progress Notes (Signed)
  Supervised Weight Loss:  Appt start time: 835 end time:  850  SWL visit 4:  Primary concerns today: [name] returns for her 4th SWL visit in preparation for bariatric surgery having gained 4 pounds. She just returned from a vacation last weekend. [name] will be working in [place] in the coming weeks and will not be able to walk as much. Plans to go to the hotel gym. She also plans to pack her Premier protein shakes and keep them on hand to have in case she is not able to get a meal.  Goal: Continue to practice pre op goals.  Weight: 240.9 lbs BMI: 40.8  MEDICATIONS: see list  Recent physical activity: did not assess today  Estimated energy needs: 1400-1600 calories  Progress Towards Goal(s):  In progress.   Nutritional Diagnosis:  [place]-3.3 Overweight/obesity related to past poor dietary habits and physical inactivity as evidenced by patient in SWL for pending bariatric surgery following dietary guidelines for continued weight loss.     Intervention:  Nutrition counseling provided.   Monitoring/Evaluation:  Dietary intake, exercise, pre op goals, and body weight in 4 week(s).

## 2014-06-20 ENCOUNTER — Encounter: Payer: Self-pay | Admitting: *Deleted

## 2014-06-27 ENCOUNTER — Encounter: Payer: 59 | Attending: Surgery | Admitting: Dietician

## 2014-06-27 ENCOUNTER — Encounter: Payer: Self-pay | Admitting: Dietician

## 2014-06-27 DIAGNOSIS — Z6841 Body Mass Index (BMI) 40.0 and over, adult: Secondary | ICD-10-CM | POA: Insufficient documentation

## 2014-06-27 DIAGNOSIS — Z713 Dietary counseling and surveillance: Secondary | ICD-10-CM | POA: Diagnosis not present

## 2014-06-27 NOTE — Progress Notes (Signed)
  Supervised Weight Loss:  Appt start time: 800 end time:  815  SWL visit 5:  Primary concerns today: [name] returns for her 5th SWL visit in preparation for bariatric surgery having lost 1/2 a pound. She just returned from a business trip to [place] and sprained both ankles. She is continuing to practice pre op goals in preparation for surgery.   Goal: Continue to practice pre op goals.  Weight: 240.6 lbs BMI: 40.7  MEDICATIONS: see list  Recent physical activity: none due to ankle injuries  Estimated energy needs: 1400-1600 calories  Progress Towards Goal(s):  In progress.   Nutritional Diagnosis:  [place]-3.3 Overweight/obesity related to past poor dietary habits and physical inactivity as evidenced by patient in SWL for pending bariatric surgery following dietary guidelines for continued weight loss.     Intervention:  Nutrition counseling provided.   Monitoring/Evaluation:  Dietary intake, exercise, pre op goals, and body weight in 4 week(s).

## 2014-07-17 ENCOUNTER — Encounter: Payer: Self-pay | Admitting: Emergency Medicine

## 2014-07-17 ENCOUNTER — Telehealth: Payer: Self-pay

## 2014-07-17 ENCOUNTER — Ambulatory Visit (INDEPENDENT_AMBULATORY_CARE_PROVIDER_SITE_OTHER): Payer: 59 | Admitting: Emergency Medicine

## 2014-07-17 VITALS — BP 142/90 | HR 88 | Temp 98.5°F | Resp 18 | Ht 65.0 in | Wt 237.6 lb

## 2014-07-17 DIAGNOSIS — E559 Vitamin D deficiency, unspecified: Secondary | ICD-10-CM

## 2014-07-17 DIAGNOSIS — E119 Type 2 diabetes mellitus without complications: Secondary | ICD-10-CM

## 2014-07-17 DIAGNOSIS — D6851 Activated protein C resistance: Secondary | ICD-10-CM

## 2014-07-17 DIAGNOSIS — Z23 Encounter for immunization: Secondary | ICD-10-CM

## 2014-07-17 DIAGNOSIS — E669 Obesity, unspecified: Secondary | ICD-10-CM | POA: Diagnosis not present

## 2014-07-17 HISTORY — DX: Vitamin D deficiency, unspecified: E55.9

## 2014-07-17 HISTORY — DX: Type 2 diabetes mellitus without complications: E11.9

## 2014-07-17 HISTORY — DX: Activated protein C resistance: D68.51

## 2014-07-17 MED ORDER — PNEUMOCOCCAL VAC POLYVALENT 25 MCG/0.5ML IJ INJ
0.5000 mL | INJECTION | Freq: Once | INTRAMUSCULAR | Status: AC
  Administered 2014-07-17: 0.5 mL via INTRAMUSCULAR

## 2014-07-17 NOTE — Telephone Encounter (Signed)
lmom to give [name] a call back

## 2014-07-17 NOTE — Telephone Encounter (Signed)
Patient would like to speak with Areta HaberMichelle Morehead in regards to her appointment today with Dr. Cleta Albertsaub. Please call! [phone]

## 2014-07-17 NOTE — Progress Notes (Addendum)
Subjective:  This chart was scribed for Jodi Chris, MD by [name], Medical Scribe. This patient was seen in Room 23 and the patient's care was started at 11:05 AM.   Patient ID: [name], female    DOB: [birth year], 54 y.o.   MRN: [id number]  HPI HPI Comments: [name] is a 54 y.o. female with a PMHx of hypercholesterolemia, CAD, and morbid obesity, who presents to [facility] for bariatric surgery clearance.  Pt reports that she had a thrombosis on her right ankle, and she was prescribed xarelto about a year ago. Pt has stopped taking this medication 8 months ago.  Pt notes that she does not have any problems with anesthesia. She only had a nausea episode the first time she had it done, however the few times after that she did not have any problems.  Pt had an echocardiogram done on Feb 2015 by Dr. Jacinto Halim (cardiology), results were normal. Pt was never a smoker, and only drinks on social occasions. She has no previous drug use.  Pt reports that her next vaccines are due in September of this year. She notes that she does have a history of allergies.  She notes that the next step for her will be that she will need clearance from her nutritionist Clydie Braun, RD which will be next month in July. Surgery has not yet been scheduled.   Pt notes that she fell and sprained both of her ankles few weeks ago as a result of a fall.   She reports that she has recently been laid-off her job at [employer] as a result of staff cut.    Review of Systems  Musculoskeletal: Positive for arthralgias (ankle).      Objective:   Physical Exam  Constitutional: She is oriented to person, place, and time. She appears well-developed and well-nourished. No distress.  cooperative  HENT:  Head: Normocephalic and atraumatic.  Eyes: EOM are normal. Pupils are equal, round, and reactive to light.  Neck: Neck supple.  Cardiovascular: Normal rate, regular rhythm and normal  heart sounds.   Pulmonary/Chest: Effort normal.  Musculoskeletal: She exhibits no edema.  no evidence of DVT on either leg.   Neurological: She is alert and oriented to person, place, and time. No cranial nerve deficit.  Skin: Skin is warm and dry.  Psychiatric: She has a normal mood and affect. Her behavior is normal.  Nursing note and vitals reviewed.     Assessment & Plan:  1. Obesity I was concerned with her upcoming surgery and felt a pneumonia vaccine was indicated. I also told her she needed to have a flu shot prior to her surgery. She will also need clearance from Dr. Jacinto Halim because of her history of premature coronary disease. She had a cardiac workup less than 2 years ago. She has also been seen by Dr. Guss Bunde and required treatment with Xarelto for a year. He will need to clear her from a venous standpoint. Blood work had previously been done at Dr [place] office. - pneumococcal 23 valent vaccine (PNU-IMMUNE) injection 0.5 mL; Inject 0.5 mLs into the muscle once. I received her blood work from [facility] [place] [facility]. She has an elevated A1c elevated cholesterol low vitamin D and some white cells in her urine. I advised her to return to clinic to have these followed up on. She also is positive for factor V mutation. Heterozygous  I personally performed the services described in this documentation,  which was scribed in my presence. The recorded information has been reviewed and is accurate.  Jodi ChrisSteven Lilyona Richner, MD  [facility], [facility]  07/17/2014 12:46 PM

## 2014-07-23 ENCOUNTER — Telehealth: Payer: Self-pay

## 2014-07-23 NOTE — Telephone Encounter (Signed)
Called [name] and LM letting her know that we got her notes from [facility] and that Dr. Cleta Albertsaub wants to make sure she keeps her appt tomorrow with him.

## 2014-07-24 ENCOUNTER — Ambulatory Visit (INDEPENDENT_AMBULATORY_CARE_PROVIDER_SITE_OTHER): Payer: 59 | Admitting: Emergency Medicine

## 2014-07-24 ENCOUNTER — Encounter: Payer: Self-pay | Admitting: Emergency Medicine

## 2014-07-24 VITALS — BP 138/88 | HR 86 | Temp 98.3°F | Resp 16 | Ht 65.0 in | Wt 239.8 lb

## 2014-07-24 DIAGNOSIS — D688 Other specified coagulation defects: Secondary | ICD-10-CM

## 2014-07-24 DIAGNOSIS — R739 Hyperglycemia, unspecified: Secondary | ICD-10-CM

## 2014-07-24 DIAGNOSIS — D6851 Activated protein C resistance: Secondary | ICD-10-CM

## 2014-07-24 LAB — POCT GLYCOSYLATED HEMOGLOBIN (HGB A1C): Hemoglobin A1C: 6.1

## 2014-07-24 NOTE — Progress Notes (Signed)
° °  Subjective:  This chart was scribed for Lesle ChrisSteven Daub, MD by Broadus Johnawaa Al Rifaie, Medical Scribe. This patient was seen in Room 23 and the patient's care was started at 12:16 PM.   Patient ID: [name], female    DOB: [birth year], 54 y.o.   MRN: [id number]  HPI HPI Comments: [name] is a 54 y.o. female with a PMHx of morbid obesity, and DM, who presents to [facility] for a follow up for her bariatric surgery clearance.  Test results showed a Leiden factor 5 genetic mutation, a clotting disorder that could affect her surgery. She notes that she had thrombosis in her leg and was prescribed blood thinner for that. She indicates that she was advised that the problem might have been due to birth control pills. Pt has had two episodes of clots now. She states that she takes baby aspirin everyday. Pt inquires about what the best next step for her would be. She notes that her lifestyle requires her to travel and move a lot, therefore she is concerned that she would not be able to follow a diet regiment as a result. Pt reports that she does not have any children.   Pt's surgery is not scheduled yet. Her last nutritionist visit is in 3 days.    Review of Systems     Objective:   Physical Exam  Constitutional: She is oriented to person, place, and time. She appears well-developed and well-nourished. No distress.  HENT:  Head: Normocephalic and atraumatic.  Eyes: EOM are normal. Pupils are equal, round, and reactive to light.  Neck: Neck supple.  Cardiovascular: Normal rate.   Pulmonary/Chest: Effort normal.  Neurological: She is alert and oriented to person, place, and time. No cranial nerve deficit.  Skin: Skin is warm and dry.  Psychiatric: She has a normal mood and affect. Her behavior is normal.  Nursing note and vitals reviewed.  Results for orders placed or performed in visit on 07/24/14  POCT glycosylated hemoglobin (Hb A1C)  Result Value Ref Range   Hemoglobin A1C 6.1       Assessment & Plan:   Pt has had two previous clots. She is heterozygous for leiden factor 5 mutation. Referral made to Dr. Myna HidalgoEnnever in hematology for evaluation of clot risk prior to gastric surgery. She also had an elevation of hem A1C and this will be reevaluated today. I personally performed the services described in this documentation, which was scribed in my presence. The recorded information has been reviewed and is accurate.  Earl LitesSteve Daub, MD

## 2014-07-24 NOTE — Progress Notes (Signed)
   Subjective:  °This chart was scribed for Jodi Coulson, MD by [name], Medical Scribe. This patient was seen in Room 23 and the patient's care was started at 12:16 PM. ° ° Patient ID: [name], female    DOB: 05/12/1960, 54 y.o.   MRN: [id number] ° °HPI °HPI Comments: °[name] is a 54 y.o. female with a PMHx of morbid obesity, and DM, who presents to [facility] for a follow up for her bariatric surgery clearance.  °Test results showed a Leiden factor 5 genetic mutation, a clotting disorder that could affect her surgery. She notes that she had thrombosis in her leg and was prescribed blood thinner for that. She indicates that she was advised that the problem might have been due to birth control pills. Pt has had two episodes of clots now. She states that she takes baby aspirin everyday. Pt inquires about what the best next step for her would be. She notes that her lifestyle requires her to travel and move a lot, therefore she is concerned that she would not be able to follow a diet regiment as a result. Pt reports that she does not have any children.  ° °Pt's surgery is not scheduled yet. Her last nutritionist visit is in 3 days.  ° ° °Review of Systems ° °   °Objective:  ° Physical Exam  °Constitutional: She is oriented to person, place, and time. She appears well-developed and well-nourished. No distress.  °HENT:  °Head: Normocephalic and atraumatic.  °Eyes: EOM are normal. Pupils are equal, round, and reactive to light.  °Neck: Neck supple.  °Cardiovascular: Normal rate.   °Pulmonary/Chest: Effort normal.  °Neurological: She is alert and oriented to person, place, and time. No cranial nerve deficit.  °Skin: Skin is warm and dry.  °Psychiatric: She has a normal mood and affect. Her behavior is normal.  °Nursing note and vitals reviewed. ° °Results for orders placed or performed in visit on 07/24/14  °POCT glycosylated hemoglobin (Hb A1C)  °Result Value Ref Range  ° Hemoglobin A1C 6.1   ° °   °Assessment & Plan:  ° °Pt has had two previous clots. She is heterozygous for leiden factor 5 mutation. Referral made to Dr. Ennever in hematology for evaluation of clot risk prior to gastric surgery. She also had an elevation of hem A1C and this will be reevaluated today. I personally performed the services described in this documentation, which was scribed in my presence. The recorded information has been reviewed and is accurate. ° °Steve Azarah Dacy, MD  °

## 2014-07-25 ENCOUNTER — Ambulatory Visit: Payer: 59 | Admitting: Dietician

## 2014-07-28 ENCOUNTER — Encounter: Payer: Self-pay | Admitting: Dietician

## 2014-07-28 ENCOUNTER — Encounter: Payer: 59 | Attending: Surgery | Admitting: Dietician

## 2014-07-28 DIAGNOSIS — Z713 Dietary counseling and surveillance: Secondary | ICD-10-CM | POA: Insufficient documentation

## 2014-07-28 DIAGNOSIS — Z6841 Body Mass Index (BMI) 40.0 and over, adult: Secondary | ICD-10-CM | POA: Diagnosis not present

## 2014-07-28 NOTE — Progress Notes (Signed)
  Supervised Weight Loss:  Appt start time: 810 end time:  825  SWL visit 6:  Primary concerns today: Boyd Kerbsenny returns for her 6th and final SWL visit in preparation for bariatric surgery having lost another 2 pounds. She has been reading books to prepare for surgery and has been researching different bariatric-friendly recipes. She reports that she feels prepared for surgery.   Goal: Continue to practice pre op goals.  Weight: 238.8 lbs BMI: 40.4  MEDICATIONS: see list  Recent physical activity: none due to ankle injuries  Estimated energy needs: 1400-1600 calories  Progress Towards Goal(s):  In progress.   Nutritional Diagnosis:  [place]-3.3 Overweight/obesity related to past poor dietary habits and physical inactivity as evidenced by patient in SWL for pending bariatric surgery following dietary guidelines for continued weight loss.     Intervention:  Nutrition counseling provided.   Monitoring/Evaluation:  Dietary intake, exercise, pre op goals, and body weight in 4 week(s).

## 2014-07-29 ENCOUNTER — Telehealth: Payer: Self-pay

## 2014-07-29 ENCOUNTER — Telehealth: Payer: Self-pay | Admitting: Emergency Medicine

## 2014-07-29 NOTE — Telephone Encounter (Signed)
Left message for patient with date and times for new patient appointments.

## 2014-08-06 ENCOUNTER — Telehealth: Payer: Self-pay | Admitting: Hematology & Oncology

## 2014-08-06 NOTE — Telephone Encounter (Signed)
Returned pt's call regarding her appt. Lt mess

## 2014-08-14 ENCOUNTER — Telehealth: Payer: Self-pay

## 2014-08-14 NOTE — Telephone Encounter (Signed)
Confirmed with [place] new pt appt. With Dr Myna Hidalgo on 09/04/14 starting at 1:00

## 2014-08-22 ENCOUNTER — Ambulatory Visit: Payer: 59 | Admitting: Dietician

## 2014-09-04 ENCOUNTER — Ambulatory Visit: Payer: 59

## 2014-09-04 ENCOUNTER — Encounter: Payer: Self-pay | Admitting: Hematology & Oncology

## 2014-09-04 ENCOUNTER — Telehealth: Payer: Self-pay | Admitting: Emergency Medicine

## 2014-09-04 ENCOUNTER — Ambulatory Visit (HOSPITAL_BASED_OUTPATIENT_CLINIC_OR_DEPARTMENT_OTHER): Payer: 59 | Admitting: Hematology & Oncology

## 2014-09-04 ENCOUNTER — Other Ambulatory Visit: Payer: 59

## 2014-09-04 VITALS — BP 130/87 | HR 81 | Temp 97.8°F | Resp 18 | Ht 64.0 in | Wt 239.0 lb

## 2014-09-04 DIAGNOSIS — D6851 Activated protein C resistance: Secondary | ICD-10-CM | POA: Diagnosis not present

## 2014-09-04 DIAGNOSIS — Z7982 Long term (current) use of aspirin: Secondary | ICD-10-CM

## 2014-09-04 DIAGNOSIS — Z86718 Personal history of other venous thrombosis and embolism: Secondary | ICD-10-CM

## 2014-09-04 MED ORDER — RIVAROXABAN 15 MG PO TABS: ORAL_TABLET | ORAL | Status: DC

## 2014-09-04 NOTE — Telephone Encounter (Signed)
FYI

## 2014-09-04 NOTE — Progress Notes (Signed)
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 OTHER  : I am supposed to have surgery and I do not want a blood clot.  HPI: [name] is a very charming 54 year old white female. She certainly looks younger than her age. She used to work for [employer]. She traveled around the world.  She had a blood clot in her right thigh about 12 years ago. At that time, she was on oral contraceptives. She was placed on Coumadin. She thinks she is on Coumadin for several months.  She had some vein surgery done with her legs several years ago. She then had a blood clot around her ankle. I'm not sure which leg this was. At that time, she was then put on Xarelto.  She is to have gastric sleeve surgery for obesity. She was seen by her doctor, Dr. Cleta Alberts. He went ahead and did a thrombophilic evaluation. Surprisingly enough, he found that she was heterozygous for factor V Leiden mutation.  She has been on baby aspirin.  She does not recall any butting the family having positive blood clots. She is not married. She has never had any children. She's never had any miscarriages.  Because of her upcoming surgery, Dr. Cleta Alberts wanted [place] to see her to see how we can best manage her clotting risk so that she has a minimal risk of probable embolic disease after her surgery.  She is not sure what her surgery will be.  She does not have diabetes. She does not smoke. She really has a glass of wine.  She is not exercising all that much.  She recently was "downsized" from her job. She has been enjoying the time off. She does plan to go back to work in the future.  She was on oral contraceptives for several years. She was taken off when she had the thrombus in her right leg and 12 years ago.  There is no history in the family with cancer. I think there is history of stroke in the family.  Overall, her  performance status is ECOG 0.     Past Medical History  Diagnosis Date  . Allergy   . Migraine   . Chest tightness   . Hyperlipidemia   . Anxiety   . Depression   :  Past Surgical History  Procedure Laterality Date  . Tubal ligation  2006  . Vein ligation Bilateral   :   Current outpatient prescriptions:  .  aspirin 81 MG tablet, Take 81 mg by mouth daily., Disp: , Rfl:  .  Cholecalciferol (VITAMIN D3) 1000 UNITS CAPS, Take 1,000 Int'l Units by mouth daily., Disp: , Rfl:  .  frovatriptan (FROVA) 2.5 MG tablet, Take 1 tablet (2.5 mg total) by mouth as needed for migraine. If recurs, may repeat after 2 hours. Max of 3 tabs in 24 hours., Disp: 27 tablet, Rfl: 3 .  GLUCOSAMINE HCL PO, Take 2,000 tablets by mouth 2 (two) times daily., Disp: , Rfl:  .  Magnesium 500 MG CAPS, Take 500 mg by mouth daily., Disp: , Rfl:  .  Omega-3 Fatty Acids (FISH OIL PO), Take 1,000 mg by mouth. Mega Red 300 MG Daily, Disp: , Rfl:  .  OVER THE COUNTER MEDICATION, Melantonin 2 mg taking occas, Disp: , Rfl:  .  SUMAtriptan (IMITREX) 6 MG/0.5ML SOLN injection, Inject 0.5 mLs (6 mg total) into the  skin as needed for migraine or headache. May repeat in 2 hours if needed, Disp: 36 vial, Rfl: 3 .  SUMAtriptan 6 MG/0.5ML SOAJ, SEE DIRECTIONS IN ATTACHED LITERATURE. USE AS INSTRUCTED BY YOUR PRESCRIBER, Disp: 12 cartridge, Rfl: 3 .  Rivaroxaban (XARELTO) 15 MG TABS tablet, Take 1 pill a day starting 2 weeks before surgery.  Re-start 1 day after surgery for 4 weeks, Disp: 42 tablet, Rfl: 0:  :  Allergies  Allergen Reactions  . Statins Other (See Comments)    Pt states she has severe joint and body aches.  . Codeine Hives    rash  :  Family History  Problem Relation Age of Onset  . Diabetes Mother 67    type 2  . Diabetes Father 71    type 2  . Dementia Father 40  . Hyperlipidemia Father   . Diabetes Sister   . Hyperlipidemia Sister   . Hypertension Sister   . Diabetes Brother   . Heart disease  Brother   . Hyperlipidemia Brother   . Hypertension Brother   :  Social History   Social History  . Marital Status: Divorced    Spouse Name: N/A  . Number of Children: 0  . Years of Education: 12   Occupational History  . 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 Smoking status: Never Smoker   . Smokeless tobacco: Never Used  . Alcohol Use: 0.6 oz/week    1 Glasses of wine per week     Comment: OCC  . Drug Use: No  . Sexual Activity: Not on file   Other Topics Concern  . Not on file   Social History Narrative   Patient is divorced. Patient has a high school education and works for [employer].    Right handed.   Caffeine- two daily           :  Pertinent items are noted in HPI.  Exam: @  obese white female in no obvious distress. Vital signs show a temperature of 97.8. Pulse 81. Blood pressure 130/87. Weight is 239 pounds. Head and neck exam shows no ocular or oral lesions. She has no palpable cervical or supraclavicular lymph nodes. Lungs are clear. Cardiac exam regular rate and rhythm with no murmurs, rubs or bruits. Abdomen is soft. Abdomen is obese. She has good bowel sounds. There is no fluid wave. There is no palpable liver or spleen tip. Back exam shows no tenderness over the spine, ribs or hips. Extremities shows no clubbing, cyanosis or edema. She has good range of motion of her joints. No venous cord is palpable in her legs. Skin exam shows no rashes, ecchymoses or petechia. Neurological exam shows no focal neurological deficits.   No results for input(s): WBC, HGB, HCT, PLT in the last 72 hours. No results for input(s): NA, K, CL, CO2, GLUCOSE, BUN, CREATININE, CALCIUM in the last 72 hours.  Blood smear review: None  Pathology: None     Assessment and Plan:  [name] is a very charming 54 year old white female. She is heterozygous for the Factor 5 Leiden mutation.  She's had 2 thromboembolic episodes. They  weren't several years apart.  I do think that she does need some type of protection after her surgery. He would though she really should not be immobilized for a while, I still think that she would have a significantly increased risk of thromboembolic disease.  I think what would be reasonable  to get her on Xarelto before her surgery. I told her to take 15 mg a day of Xarelto for 2 weeks prior to her surgery. I would then have her stop the Xarelto for 2 days. I would then have her get back on Xarelto 15 mg a day starting the day after her surgery. I would then have her take it for 4 weeks and then she can get off the Xarelto.  She is in agreement with this. I just told her to let me know when her surgery was going to be.  I spent about 45 minutes with her. I reviewed with her what the factor V Leiden mutation did. I explained my rationale for having her on blood thinner.  I will not have to get her back to the office. I will be would have her to see her back in the future if any issues arise that required anticoagulation.

## 2014-09-04 NOTE — Telephone Encounter (Signed)
Patient states that another physician is supposed to be sending some of her lab results to Dr. Cleta Alberts. Patient would like Dr. Cleta Alberts to call her when he reviews these results. Patient is scheduled for gastric bypass soon. She also has an appointment with Dr. Cleta Alberts on 09/16/2014 at 10:15am.   [phone]

## 2014-09-05 NOTE — Telephone Encounter (Signed)
Have patient call me next week to be sure I have received the blood work. I would like to have all those results when I see her on August 30.

## 2014-09-05 NOTE — Telephone Encounter (Signed)
Called and spoke with pt and she is aware of Dr. Deforest Hoyles request to call back next week to make sure he has received all of the results, so he will have these prior to her appt on August 30.  Pt voiced her understanding and she will call back next Friday to check on this.  Will forward back to Dr. Cleta Alberts to make him aware.

## 2014-09-16 ENCOUNTER — Encounter: Payer: Self-pay | Admitting: Emergency Medicine

## 2014-09-16 ENCOUNTER — Ambulatory Visit: Payer: 59 | Admitting: Family Medicine

## 2014-09-16 ENCOUNTER — Ambulatory Visit (INDEPENDENT_AMBULATORY_CARE_PROVIDER_SITE_OTHER): Payer: 59 | Admitting: Emergency Medicine

## 2014-09-16 VITALS — BP 126/87 | HR 91 | Temp 98.7°F | Resp 16 | Ht 64.0 in | Wt 238.0 lb

## 2014-09-16 DIAGNOSIS — E119 Type 2 diabetes mellitus without complications: Secondary | ICD-10-CM

## 2014-09-16 DIAGNOSIS — Z23 Encounter for immunization: Secondary | ICD-10-CM

## 2014-09-16 DIAGNOSIS — Z1211 Encounter for screening for malignant neoplasm of colon: Secondary | ICD-10-CM

## 2014-09-16 DIAGNOSIS — R1314 Dysphagia, pharyngoesophageal phase: Secondary | ICD-10-CM

## 2014-09-16 DIAGNOSIS — E669 Obesity, unspecified: Secondary | ICD-10-CM

## 2014-09-16 NOTE — Progress Notes (Addendum)
This chart was scribed for Jodi Chris, MD by Stann Ore, Medical Scribe. This patient was seen in Room 22 and the patient's care was started 10:47 AM.  Chief Complaint:  Chief Complaint  Patient presents with  . Advice Only    not here for Diabetes check today, states she had a lot of bloodwork recently    HPI: [name] is a 54 y.o. female who reports to [facility] today for surgical consult (gastric bypass).  Weight She did weight watchers but couldn't maintain. She lost 62 lbs from it.  She's been reading up on the Atkins diet.  She used to have membership to the gym. She used to bike outside, and also has a treadmill that she uses every now and then.   She sees a nutritionist Clydie Braun) at [facility] on [address].   Her last a1c 6.1.  Her siblings have HTN and DM.   GI She believes she needs colonoscopy and endoscopy. In the morning, after drinking water, she feels some chest tightness describing like something is stuck. When she eats, she also noticed having this issue sometimes.   Immunization She received a flu shot today.    Past Medical History  Diagnosis Date  . Allergy   . Migraine   . Chest tightness   . Hyperlipidemia   . Anxiety   . Depression    Past Surgical History  Procedure Laterality Date  . Tubal ligation  2006  . Vein ligation Bilateral    Social History   Social History  . Marital Status: Divorced    Spouse Name: N/A  . Number of Children: 0  . Years of Education: 12   Occupational History  . 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 Smoking status: Never Smoker   . Smokeless tobacco: Never Used  . Alcohol Use: 0.6 oz/week    1 Glasses of wine per week     Comment: OCC  . Drug Use: No  . Sexual Activity: Not Asked   Other Topics Concern  . None   Social History Narrative   Patient is divorced. Patient has a high school education and works  for [employer].    Right handed.   Caffeine- two daily            Family History  Problem Relation Age of Onset  . Diabetes Mother 105    type 2  . Diabetes Father 27    type 2  . Dementia Father 36  . Hyperlipidemia Father   . Diabetes Sister   . Hyperlipidemia Sister   . Hypertension Sister   . Diabetes Brother   . Heart disease Brother   . Hyperlipidemia Brother   . Hypertension Brother    Allergies  Allergen Reactions  . Statins Other (See Comments)    Pt states she has severe joint and body aches.  . Codeine Hives    rash   Prior to Admission medications   Medication Sig Start Date End Date Taking? Authorizing Provider  aspirin 81 MG tablet Take 81 mg by mouth daily.   Yes Historical Provider, MD  Cholecalciferol (VITAMIN D3) 1000 UNITS CAPS Take 1,000 Int'l Units by mouth daily.   Yes Historical Provider, MD  frovatriptan (FROVA) 2.5 MG tablet Take 1 tablet (2.5 mg total) by mouth as needed for migraine. If recurs, may repeat after 2 hours. Max of 3 tabs in 24  hours. 09/13/12  Yes Levert Feinstein, MD  GLUCOSAMINE HCL PO Take 2,000 tablets by mouth 2 (two) times daily.   Yes Historical Provider, MD  Magnesium 500 MG CAPS Take 500 mg by mouth daily.   Yes Historical Provider, MD  Omega-3 Fatty Acids (FISH OIL PO) Take 1,000 mg by mouth. Mega Red 300 MG Daily   Yes Historical Provider, MD  OVER THE COUNTER MEDICATION Melantonin 2 mg taking occas   Yes Historical Provider, MD  SUMAtriptan (IMITREX) 6 MG/0.5ML SOLN injection Inject 0.5 mLs (6 mg total) into the skin as needed for migraine or headache. May repeat in 2 hours if needed 07/12/13  Yes Levert Feinstein, MD  SUMAtriptan 6 MG/0.5ML SOAJ SEE DIRECTIONS IN ATTACHED LITERATURE. USE AS INSTRUCTED BY YOUR PRESCRIBER 12/26/13  Yes Levert Feinstein, MD     ROS: The patient denies fevers, chills, night sweats, unintentional weight loss, chest pain, palpitations, wheezing, dyspnea on exertion, nausea, vomiting, abdominal pain, dysuria,  hematuria, melena, numbness, weakness, or tingling.   All other systems have been reviewed and were otherwise negative with the exception of those mentioned in the HPI and as above.    PHYSICAL EXAM: Filed Vitals:   09/16/14 1025  BP: 126/87  Pulse: 91  Temp: 98.7 F (37.1 C)  Resp: 16   Body mass index is 40.83 kg/(m^2).   General: Alert, no acute distress, overweight HEENT:  Normocephalic, atraumatic, oropharynx patent. Eye: [facility] Cardiovascular:  Regular rate and rhythm, no rubs murmurs or gallops.  No Carotid bruits, radial pulse intact. No pedal edema.  Respiratory: Clear to auscultation bilaterally.  No wheezes, rales, or rhonchi.  No cyanosis, no use of accessory musculature Abdominal: No organomegaly, abdomen is soft and non-tender, positive bowel sounds.  No masses. Musculoskeletal: Gait intact. No edema, tenderness Skin: No rashes. Neurologic: Facial musculature symmetric. Psychiatric: Patient acts appropriately throughout our interaction.  Lymphatic: No cervical or submandibular lymphadenopathy Genitourinary/Anorectal: No acute findings   LABS: Results for orders placed or performed in visit on 08/06/14  Protime-INR  Result Value Ref Range   INR  0.9 - 1.1     EKG/XRAY:   Primary read interpreted by Dr. Cleta Alberts at [facility].   ASSESSMENT/PLAN: 1. Need for prophylactic vaccination and inoculation against influenza  - Flu Vaccine QUAD 36+ mos IM  2. 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 I have advised her not to have surgery at the present time. She has been to see Dr. Myna Hidalgo and received his recommendations. - Amb ref to Medical Nutrition Therapy-MNT  3. Type 2 diabetes mellitus without complication Last hemoglobin A1c was 6.1 - Amb ref to Medical Nutrition Therapy-MNT  4. Dysphagia, pharyngoesophageal phase I suspect this is esophageal spasm. She has difficulty swallowing water as well as solid foods. - Ambulatory referral to  Gastroenterology  5. Special screening for malignant neoplasms, colon  - Ambulatory referral to Gastroenterology   I personally performed the services described in this documentation, which was scribed in my presence. The recorded information has been reviewed and is accurate.  Jodi Chris, MD  [facility]  09/16/2014 11:08 AM   Gross sideeffects, risk and benefits, and alternatives of medications d/w patient. Patient is aware that all medications have potential sideeffects and we are unable to predict every sideeffect or drug-drug interaction that may occur.  Jodi Chris MD 09/16/2014 11:07 AM

## 2014-09-19 ENCOUNTER — Encounter: Payer: Self-pay | Admitting: Gastroenterology

## 2014-10-21 ENCOUNTER — Encounter: Payer: Self-pay | Admitting: Emergency Medicine

## 2014-10-29 ENCOUNTER — Encounter: Payer: 59 | Attending: Surgery | Admitting: Dietician

## 2014-10-29 VITALS — Ht 64.5 in | Wt 242.8 lb

## 2014-10-29 DIAGNOSIS — Z713 Dietary counseling and surveillance: Secondary | ICD-10-CM | POA: Insufficient documentation

## 2014-10-29 DIAGNOSIS — Z6841 Body Mass Index (BMI) 40.0 and over, adult: Secondary | ICD-10-CM | POA: Diagnosis not present

## 2014-10-29 DIAGNOSIS — E119 Type 2 diabetes mellitus without complications: Secondary | ICD-10-CM

## 2014-10-29 NOTE — Progress Notes (Signed)
  Medical Nutrition Therapy:  Appt start time: 0900 end time:  1015   Assessment:  Primary concerns today: [name] [facility] to [facility] today referred for type 2 diabetes. She completed supervised weight loss in the summer of 2016 in pursuit of sleeve gastrectomy. She reports that her doctor recommended that she not have surgery at this time due to a condition that causes blood clots. [name] states that she is here because she wants to lose weight. Has given up sodas and drinking more water. She is currently unemployed and has been spending a lot of time looking for jobs, may spend 6-7 hours per day at her computer. She goes to the grocery store at least 1x a week with a list. [name] reports that she may cook several different meals and keep a variety of leftovers in her fridge. She states that she thinks this causes her to eat more.   Preferred Learning Style:   No preference indicated   Learning Readiness:   Ready  MEDICATIONS: see list   DIETARY INTAKE:  24-hr recall:  B ( AM): sometimes skips, may have a protein shake  Snk ( AM):   L (12 PM): 1 egg and 3 pieces bacon  Snk (4-5 PM): pita chips D ( PM): salad or chicken breast and grilled vegetables (sometimes has several small meals in the evenings) Snk ( PM): apple  Beverages: water  Usual physical activity: none  Estimated energy needs: 1500-1700 calories  Progress Towards Goal(s):  In progress.   Nutritional Diagnosis:  [place]-2.2 Altered nutrition-related laboratory As related to obesity, excessive carbohydrate intake, physical inactivity, and erratic meal pattern.  As evidenced by HgbA1c 6.1%.    Intervention:  Nutrition counseling provided. Goals: -Keep working on developing a routine with structured meals and snacks -Try writing out a scheduled meal plan  -Work on making enough for just 2 meals -Eat meals and snacks ONLY in the kitchen  -Make time to walk or do something active every day  -Pre portion snack foods and include  a protein with snacks ([place]key pepperoni, hummus, cheese stick) -Keep a food journal   Teaching Method Utilized:  Visual Auditory Hands on  Handouts given during visit include:  Meal planning card  Barriers to learning/adherence to lifestyle change: none  Demonstrated degree of understanding via:  Teach Back   Monitoring/Evaluation:  Dietary intake, exercise, and body weight in 1 month(s).

## 2014-10-29 NOTE — Patient Instructions (Addendum)
-  Keep working on developing a routine with structured meals and snacks -Try writing out a scheduled meal plan  -Work on making enough for just 2 meals -Eat meals and snacks ONLY in the kitchen  -Make time to walk or do something active every day  -Pre portion snack foods and include a protein with snacks ([place]key pepperoni, hummus, cheese stick) -Keep a food journal

## 2014-10-30 ENCOUNTER — Encounter: Payer: Self-pay | Admitting: Dietician

## 2014-11-10 ENCOUNTER — Ambulatory Visit (INDEPENDENT_AMBULATORY_CARE_PROVIDER_SITE_OTHER): Payer: 59 | Admitting: Gastroenterology

## 2014-11-10 ENCOUNTER — Encounter: Payer: Self-pay | Admitting: Gastroenterology

## 2014-11-10 VITALS — BP 100/88 | HR 84 | Ht 64.5 in | Wt 240.4 lb

## 2014-11-10 DIAGNOSIS — Z1211 Encounter for screening for malignant neoplasm of colon: Secondary | ICD-10-CM | POA: Diagnosis not present

## 2014-11-10 DIAGNOSIS — R1310 Dysphagia, unspecified: Secondary | ICD-10-CM

## 2014-11-10 DIAGNOSIS — K219 Gastro-esophageal reflux disease without esophagitis: Secondary | ICD-10-CM | POA: Diagnosis not present

## 2014-11-10 MED ORDER — NA SULFATE-K SULFATE-MG SULF 17.5-3.13-1.6 GM/177ML PO SOLN: ORAL | Status: DC

## 2014-11-10 NOTE — Progress Notes (Signed)
HPI: This is a very pleasant 54 year old woman    who was referred to me by Collene Gobbleaub, Steven A, MD  to evaluate  GERD, dysphasia, routine risk for colon cancer .    Chief complaint is GERD, dysphasia, routine risk for colon cancer  Odynophagia with water occasionally, intermittently.  Sometimes solid foods will hang, catch.  Pyrosis, indigestions for about 2 years.  She started prilosec in AM usually before BF. Didn't seem to help.  Took for 6 months, really no improvement.  Occasionally water brash at night.  Usually eats dinner, no food witnin 3-4 hours at bedtime.    Rare caffeine.  Overall stable weight.  Non-smoker.  Solid dysphagia 1-2 times per week.  Takes alka seltzer several time per week.    Review of systems: Pertinent positive and negative review of systems were noted in the above HPI section. Complete review of systems was performed and was otherwise normal.   Past Medical History  Diagnosis Date  . Allergy   . Migraine   . Chest tightness   . Hyperlipidemia   . Anxiety   . Depression   . Factor V deficiency (HCC)   . Blood clot in vein     right leg  . Varicose vein   . Morbid obesity with BMI of 40.0-44.9, adult [facility](HCC)     Past Surgical History  Procedure Laterality Date  . Tubal ligation  2006  . Vein ligation Bilateral     Current Outpatient Prescriptions  Medication Sig Dispense Refill  . aspirin 81 MG tablet Take 81 mg by mouth daily.    . Aspirin Effervescent (ALKA-SELTZER PO) Take 2 tablets by mouth as needed.    . Biotin (BIOTIN MAXIMUM STRENGTH) 10 MG TABS Take 1 tablet by mouth daily.    . calcium carbonate (TUMS EX) 750 MG chewable tablet Chew 3 tablets by mouth as needed for heartburn.    . Cholecalciferol (VITAMIN D3) 1000 UNITS CAPS Take 1,000 Int'l Units by mouth daily.    . Coenzyme Q10 (CO Q-10) 100 MG CAPS Take 1 tablet by mouth daily.    . frovatriptan (FROVA) 2.5 MG tablet Take 1 tablet (2.5 mg total) by mouth as needed for  migraine. If recurs, may repeat after 2 hours. Max of 3 tabs in 24 hours. 27 tablet 3  . Garlic 1000 MG CAPS Take 1 capsule by mouth daily.    Marland Kitchen. GLUCOSAMINE HCL PO Take 2,000 tablets by mouth 2 (two) times daily.    . Lactobacillus Rhamnosus, GG, (CULTURELLE PO) Take 1 tablet by mouth 2 (two) times daily.    . Magnesium 500 MG CAPS Take 500 mg by mouth daily.    . Omega-3 Fatty Acids (FISH OIL PO) Take 750 mg by mouth.     Marland Kitchen. OVER THE COUNTER MEDICATION Melantonin 2 mg taking occas    . SUMAtriptan 6 MG/0.5ML SOAJ SEE DIRECTIONS IN ATTACHED LITERATURE. USE AS INSTRUCTED BY YOUR PRESCRIBER 12 cartridge 3  . TURMERIC PO Take 1,000 mg by mouth 2 (two) times daily.     No current facility-administered medications for this visit.    Allergies as of 11/10/2014 - Review Complete 11/10/2014  Allergen Reaction Noted  . Statins Other (See Comments) 07/17/2014  . Codeine Hives 03/30/2011    Family History  Problem Relation Age of Onset  . Diabetes Mother 7070    type 2  . Diabetes Father 4168    type 2  . Dementia Father 8580  . Hyperlipidemia  Father   . Diabetes Sister   . Hyperlipidemia Sister   . Hypertension Sister   . Diabetes Brother   . Heart disease Brother   . Hyperlipidemia Brother   . Hypertension Brother   . Breast cancer Sister     Social History   Social History  . Marital Status: Divorced    Spouse Name: N/A  . Number of Children: 0  . Years of Education: 12   Occupational History  . 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 Smoking status: Never Smoker   . Smokeless tobacco: Never Used  . Alcohol Use: 0.6 oz/week    1 Glasses of wine per week     Comment: OCC  . Drug Use: No  . Sexual Activity: Not on file   Other Topics Concern  . Not on file   Social History Narrative   Patient is divorced. Patient has a high school education and works for [employer].    Right handed.   Caffeine- two daily              Physical  Exam: BP 100/88 mmHg  Pulse 84  Ht 5' 4.5" (1.638 m)  Wt 240 lb 6 oz (109.033 kg)  BMI 40.64 kg/m2 Constitutional: generally well-appearing Psychiatric: alert and oriented x3 Eyes: extraocular movements intact Mouth: oral pharynx moist, no lesions Neck: supple no lymphadenopathy Cardiovascular: heart regular rate and rhythm Lungs: clear to auscultation bilaterally Abdomen: soft, nontender, nondistended, no obvious ascites, no peritoneal signs, normal bowel sounds Extremities: no lower extremity edema bilaterally Skin: no lesions on visible extremities   Assessment and plan: 54 y.o. female with  GERD, intermittent dysphasia, routine risk for colon cancer  She does have fairly classic GERD-like symptoms of pyrosis, evening waterbrash. I am surprised that proton pump inhibitor did not help and she did seem to give it about a six-month try. A lot of her symptoms are at night since I recommended she try H2 blocker ranitidine at bedtime every night for now. She does have intermittent dysphagia I recommended that we proceed with EGD at her soonest convenience due to this alarm symptoms. At the same time I offered colon cancer screening with colonoscopy she agreed. I see no reason for any further blood tests or imaging studies prior to then.   Rob Bunting, MD [facility] 11/10/2014, 3:07 PM  Cc: Collene Gobble, MD

## 2014-11-10 NOTE — Patient Instructions (Addendum)
You will be set up for an upper endoscopy for dysphagia. Please start ranitidine 150mg  pills, take one pill at bedtime nighty. You will be set up for a colonoscopy for colon cancer screening.

## 2014-11-19 ENCOUNTER — Ambulatory Visit (INDEPENDENT_AMBULATORY_CARE_PROVIDER_SITE_OTHER): Payer: 59 | Admitting: Neurology

## 2014-11-19 ENCOUNTER — Encounter: Payer: Self-pay | Admitting: Neurology

## 2014-11-19 VITALS — BP 132/82 | HR 62 | Ht 64.5 in | Wt 239.0 lb

## 2014-11-19 DIAGNOSIS — G43009 Migraine without aura, not intractable, without status migrainosus: Secondary | ICD-10-CM

## 2014-11-19 MED ORDER — FROVATRIPTAN SUCCINATE 2.5 MG PO TABS: 2.5000 mg | ORAL_TABLET | ORAL | Status: DC | PRN

## 2014-11-19 MED ORDER — SUMATRIPTAN SUCCINATE 6 MG/0.5ML ~~LOC~~ SOAJ: 6.0000 mg | SUBCUTANEOUS | Status: DC | PRN

## 2014-11-19 MED ORDER — MELATONIN 1 MG/ML PO LIQD: 1.0000 mg | Freq: Every day | ORAL | Status: DC

## 2014-11-19 MED ORDER — TOPIRAMATE ER 100 MG PO CAP24: 100.0000 mg | ORAL_CAPSULE | Freq: Every day | ORAL | Status: DC

## 2014-11-19 NOTE — Patient Instructions (Addendum)
Magnesium oxide 400 mg twice a day Riboflavin 100 mg twice a day  Melatonin liquid form as needed for insomnia

## 2014-11-19 NOTE — Progress Notes (Signed)
PATIENT: [name] [facility] DOB: 03-Jan-1961  Chief Complaint  Patient presents with  . Migraine    Reports her migraines vary each month.  Says she had 6-8 in September and only 4 in October.  She gets a good response with her sumatriptan injections but sometimes has to follow them with Frova, for her more severe migraines.  Her last Botox treatment was in 06/2013 but stopped it when she developed difficulty breathing and an intolerable dry mouth.     HISTORICAL  [name] is a 54 year old Caucasian female, came in for Botox injection for chronic migraine headaches.  HISTORY: She has a history of chronic migraine headaches for a long time, increased since 2008, her migraine usually start at one side, occipital region, spreading forward, throbbing severe headache, with smell and light and noise sensitivity,  lasting 2-3 days  About half times in a month, she would have some degree of headaches, out of those headaches, about 2-5 are severe long-lasting headache, lasting about 2-3 days,  over the past few years, she has tried different preventive medications, Topamax, cause confusion, difficulty talking, propanolol, she could not tolerate, and doesn't help, antidepression-excessive fatigue, no help.  She was able to identify trigger of her migraine, stress, menstruation period of time, sometimes woke up with severe headache,  she is currently taking Frova and Imitrex tablets, which helped her majority of the time, this is her first Botox injection as migraine prevention  She responded very well to last Botox injection, she began to notice increased frequency of headaches since June 2014, about once a week, at the maximum benefit to Botox, she only has one to 2 headaches each month, Frova has always been able to take care of her headache, but she usually tried Imitrex tablets first, 20% of time she has to take second tablet by the afternoon, she previously responded to Imitrex subcutaneous  injection very well, she worried about losing benefit of Frova with repeat use.  UPDATE June 24th 2015: She is doing well with Imitrex injection, she still has some recurrent headaches in past 3 months, responding well with imitrex injections.  UPDATE Nov 19 2014: Last BOTOX injection was in June 2015.  She lost job in June 26 2014.  She is gong through a lot stress. She complains of increased headaches since October 2016, few times a week, has to use Imitrex injection sometimes repeat dosing a day, followed by Frova next day, does help her headaches,  Previously she has tried Topamax, complains of slow thinking, which does help her headaches   REVIEW OF SYSTEMS: Full 14 system review of systems performed and notable only for as above  ALLERGIES: Allergies  Allergen Reactions  . Statins Other (See Comments)    Pt states she has severe joint and body aches.  . Codeine Hives    rash    HOME MEDICATIONS: Current Outpatient Prescriptions  Medication Sig Dispense Refill  . aspirin 81 MG tablet Take 81 mg by mouth daily.    . Aspirin Effervescent (ALKA-SELTZER PO) Take 2 tablets by mouth as needed.    . Biotin (BIOTIN MAXIMUM STRENGTH) 10 MG TABS Take 1 tablet by mouth daily.    . calcium carbonate (TUMS EX) 750 MG chewable tablet Chew 3 tablets by mouth as needed for heartburn.    . Cholecalciferol (VITAMIN D3) 1000 UNITS CAPS Take 1,000 Int'l Units by mouth daily.    . Coenzyme Q10 (CO Q-10) 100 MG CAPS Take 1 tablet  by mouth daily.    . frovatriptan (FROVA) 2.5 MG tablet Take 1 tablet (2.5 mg total) by mouth as needed for migraine. If recurs, may repeat after 2 hours. Max of 3 tabs in 24 hours. 15 tablet 11  . Garlic 1000 MG CAPS Take 1 capsule by mouth daily.    Marland Kitchen GLUCOSAMINE HCL PO Take 2,000 tablets by mouth 2 (two) times daily.    . Lactobacillus Rhamnosus, GG, (CULTURELLE PO) Take 1 tablet by mouth 2 (two) times daily.    . Magnesium 500 MG CAPS Take 500 mg by mouth daily.    . Na  Sulfate-K Sulfate-Mg Sulf SOLN Take as directed per colonoscopy instructions 354 mL 0  . Omega-3 Fatty Acids (FISH OIL PO) Take 750 mg by mouth.     Marland Kitchen OVER THE COUNTER MEDICATION Melantonin 2 mg taking occas    . SUMAtriptan 6 MG/0.5ML SOAJ Inject 6 mg into the skin as needed. 12 cartridge 11  . TURMERIC PO Take 1,000 mg by mouth 2 (two) times daily.    . Melatonin 1 MG/ML LIQD Take 1 mg by mouth at bedtime. 58 mL 11  . Topiramate ER (TROKENDI XR) 100 MG CP24 Take 100 mg by mouth at bedtime. 30 capsule 11   No current facility-administered medications for this visit.    PAST MEDICAL HISTORY: Past Medical History  Diagnosis Date  . Allergy   . Migraine   . Chest tightness   . Hyperlipidemia   . Anxiety   . Depression   . Factor V deficiency (HCC)   . Blood clot in vein     right leg  . Varicose vein   . Morbid obesity with BMI of 40.0-44.9, adult (HCC)     PAST SURGICAL HISTORY: Past Surgical History  Procedure Laterality Date  . Tubal ligation  2006  . Vein ligation Bilateral     FAMILY HISTORY: Family History  Problem Relation Age of Onset  . Diabetes Mother 39    type 2  . Diabetes Father 66    type 2  . Dementia Father 42  . Hyperlipidemia Father   . Diabetes Sister   . Hyperlipidemia Sister   . Hypertension Sister   . Diabetes Brother   . Heart disease Brother   . Hyperlipidemia Brother   . Hypertension Brother   . Breast cancer Sister     SOCIAL HISTORY:  Social History   Social History  . Marital Status: Divorced    Spouse Name: N/A  . Number of Children: 0  . Years of Education: 12   Occupational History  . 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 Smoking status: Never Smoker   . Smokeless tobacco: Never Used  . Alcohol Use: 0.6 oz/week    1 Glasses of wine per week     Comment: OCC  . Drug Use: No  . Sexual Activity: Not on file   Other Topics Concern  . Not on file   Social History Narrative    Patient is divorced. Patient has a high school education and works for [employer].    Right handed.   Caffeine- two daily              PHYSICAL EXAM   Filed Vitals:   11/19/14 1128  BP: 132/82  Pulse: 62  Height: 5' 4.5" (1.638 m)  Weight: 239 lb (108.41 kg)    Not recorded      Body  mass index is 40.41 kg/(m^2).  PHYSICAL EXAMNIATION:  Gen: NAD, conversant, well nourised, obese, well groomed                     Cardiovascular: Regular rate rhythm, no peripheral edema, warm, nontender. Eyes: Conjunctivae clear without exudates or hemorrhage Neck: Supple, no carotid bruise. Pulmonary: Clear to auscultation bilaterally   NEUROLOGICAL EXAM:  MENTAL STATUS: Speech:    Speech is normal; fluent and spontaneous with normal comprehension.  Cognition:     Orientation to time, place and person     Normal recent and remote memory     Normal Attention span and concentration     Normal Language, naming, repeating,spontaneous speech     Fund of knowledge   CRANIAL NERVES: CN II: Visual fields are full to confrontation. Fundoscopic exam is normal with sharp discs and no vascular changes. Pupils are round equal and briskly reactive to light. CN III, IV, VI: extraocular movement are normal. No ptosis. CN V: Facial sensation is intact to pinprick in all 3 divisions bilaterally. Corneal responses are intact.  CN VII: Face is symmetric with normal eye closure and smile. CN VIII: Hearing is normal to rubbing fingers CN IX, X: Palate elevates symmetrically. Phonation is normal. CN XI: Head turning and shoulder shrug are intact CN XII: Tongue is midline with normal movements and no atrophy.  MOTOR: There is no pronator drift of out-stretched arms. Muscle bulk and tone are normal. Muscle strength is normal.  REFLEXES: Reflexes are 2+ and symmetric at the biceps, triceps, knees, and ankles. Plantar responses are flexor.  SENSORY: Intact to light touch, pinprick, position sense,  and vibration sense are intact in fingers and toes.  COORDINATION: Rapid alternating movements and fine finger movements are intact. There is no dysmetria on finger-to-nose and heel-knee-shin.    GAIT/STANCE: Posture is normal. Gait is steady with normal steps, base, arm swing, and turning. Heel and toe walking are normal. Tandem gait is normal.  Romberg is absent.   DIAGNOSTIC DATA (LABS, IMAGING, TESTING) - I reviewed patient records, labs, notes, testing and imaging myself where available.   ASSESSMENT AND PLAN  [name] is a 54 y.o. female   Chronic migraine  Trokendi ER 100 mg every night as migraine prevention  Continue Imitrex injection as needed, Frova as needed for moderate migraines  Levert FeinsteinYijun Dakai Braithwaite, M.D. Ph.D.  [facility] [address], [address] [place], [place] [address] Ph: [phone] Fax: [phone]  CC: Referring Provider

## 2014-11-24 ENCOUNTER — Other Ambulatory Visit: Payer: Self-pay | Admitting: Neurology

## 2014-11-27 ENCOUNTER — Ambulatory Visit: Payer: 59 | Admitting: Dietician

## 2014-12-19 ENCOUNTER — Encounter: Payer: 59 | Admitting: Gastroenterology

## 2014-12-23 ENCOUNTER — Encounter: Payer: Self-pay | Admitting: Emergency Medicine

## 2014-12-23 ENCOUNTER — Ambulatory Visit (INDEPENDENT_AMBULATORY_CARE_PROVIDER_SITE_OTHER): Payer: 59

## 2014-12-23 ENCOUNTER — Ambulatory Visit (INDEPENDENT_AMBULATORY_CARE_PROVIDER_SITE_OTHER): Payer: 59 | Admitting: Emergency Medicine

## 2014-12-23 VITALS — BP 131/87 | HR 79 | Temp 97.9°F | Resp 18 | Ht 64.5 in | Wt 239.0 lb

## 2014-12-23 DIAGNOSIS — E1169 Type 2 diabetes mellitus with other specified complication: Secondary | ICD-10-CM

## 2014-12-23 DIAGNOSIS — E669 Obesity, unspecified: Secondary | ICD-10-CM | POA: Diagnosis not present

## 2014-12-23 DIAGNOSIS — M79671 Pain in right foot: Secondary | ICD-10-CM

## 2014-12-23 DIAGNOSIS — E119 Type 2 diabetes mellitus without complications: Secondary | ICD-10-CM

## 2014-12-23 LAB — POCT GLYCOSYLATED HEMOGLOBIN (HGB A1C): Hemoglobin A1C: 6.2

## 2014-12-23 NOTE — Progress Notes (Addendum)
By signing my name below, I, [name], attest that this documentation has been prepared under the direction and in the presence of Jodi Chris, MD.  [name], Medical Scribe. 12/23/2014.  11:57 AM.  Chief Complaint:  Chief Complaint  Patient presents with  . Follow-up    HPI: [name] is a 54 y.o. female with a history of morbid obesity who reports to [facility] today for a 58-month follow up regarding weight management.  Pt reports that she is doing ok. She notes that she is still struggling with weight loss. She states that she is watching her diet, however she notes that she is always gaining and losing weight. She indicates that her condition could be possibly due to her unemployment. Pt notes that she lost her job in June after working at [employer] for 25 year where she used to travel a lot, and indicates that being unemployed and not being able to find any jobs locally, causing her to feel anxious and "lost". She reports that she is currently taking care of her 73 year old mother, and she is keeping herself active with different life events.   Pt notes pain on the heel of her right foot, starting onset 2-3 months ago. She indicates that the pain is most severe in the morning time that improves in quality as she goes about her day. She reports that putting pressure on the area exacerbates the pain. Pt notes that there is no possibility of currently being pregnant.    Past Medical History  Diagnosis Date  . Allergy   . Migraine   . Chest tightness   . Hyperlipidemia   . Anxiety   . Depression   . Factor V deficiency (HCC)   . Blood clot in vein     right leg  . Varicose vein   . Morbid obesity with BMI of 40.0-44.9, adult [facility])    Past Surgical History  Procedure Laterality Date  . Tubal ligation  2006  . Vein ligation Bilateral    Social History   Social History  . Marital Status: Divorced    Spouse Name: N/A  . Number of Children: 0  . Years of  Education: 12   Occupational History  . 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 Smoking status: Never Smoker   . Smokeless tobacco: Never Used  . Alcohol Use: 0.6 oz/week    1 Glasses of wine per week     Comment: OCC  . Drug Use: No  . Sexual Activity: Not Asked   Other Topics Concern  . None   Social History Narrative   Patient is divorced. Patient has a high school education and works for [employer].    Right handed.   Caffeine- two daily            Family History  Problem Relation Age of Onset  . Diabetes Mother 62    type 2  . Diabetes Father 59    type 2  . Dementia Father 65  . Hyperlipidemia Father   . Diabetes Sister   . Hyperlipidemia Sister   . Hypertension Sister   . Diabetes Brother   . Heart disease Brother   . Hyperlipidemia Brother   . Hypertension Brother   . Breast cancer Sister    Allergies  Allergen Reactions  . Statins Other (See Comments)    Pt states she has severe joint and body  aches.  . Codeine Hives    rash   Prior to Admission medications   Medication Sig Start Date End Date Taking? Authorizing Provider  aspirin 81 MG tablet Take 81 mg by mouth daily.    Historical Provider, MD  Aspirin Effervescent (ALKA-SELTZER PO) Take 2 tablets by mouth as needed.    Historical Provider, MD  Biotin (BIOTIN MAXIMUM STRENGTH) 10 MG TABS Take 1 tablet by mouth daily.    Historical Provider, MD  calcium carbonate (TUMS EX) 750 MG chewable tablet Chew 3 tablets by mouth as needed for heartburn.    Historical Provider, MD  Cholecalciferol (VITAMIN D3) 1000 UNITS CAPS Take 1,000 Int'l Units by mouth daily.    Historical Provider, MD  Coenzyme Q10 (CO Q-10) 100 MG CAPS Take 1 tablet by mouth daily.    Historical Provider, MD  frovatriptan (FROVA) 2.5 MG tablet Take 1 tablet (2.5 mg total) by mouth as needed for migraine. If recurs, may repeat after 2 hours. Max of 3 tabs in 24 hours. 11/19/14   Levert Feinstein, MD  Garlic  1000 MG CAPS Take 1 capsule by mouth daily.    Historical Provider, MD  GLUCOSAMINE HCL PO Take 2,000 tablets by mouth 2 (two) times daily.    Historical Provider, MD  Lactobacillus Rhamnosus, GG, (CULTURELLE PO) Take 1 tablet by mouth 2 (two) times daily.    Historical Provider, MD  Magnesium 500 MG CAPS Take 500 mg by mouth daily.    Historical Provider, MD  Melatonin 1 MG/ML LIQD Take 1 mg by mouth at bedtime. 11/19/14   Levert Feinstein, MD  Na Sulfate-K Sulfate-Mg Sulf SOLN Take as directed per colonoscopy instructions 11/10/14   Rachael Fee, MD  Omega-3 Fatty Acids (FISH OIL PO) Take 750 mg by mouth.     Historical Provider, MD  OVER THE COUNTER MEDICATION Melantonin 2 mg taking occas    Historical Provider, MD  SUMAtriptan 6 MG/0.5ML SOAJ USE AS INSTRUCTED BY YOUR PRESCRIBER 11/24/14   Levert Feinstein, MD  Topiramate ER (TROKENDI XR) 100 MG CP24 Take 100 mg by mouth at bedtime. 11/19/14   Levert Feinstein, MD  TURMERIC PO Take 1,000 mg by mouth 2 (two) times daily.    Historical Provider, MD     ROS: The patient has joint pain.  Patient denies fevers, chills, night sweats, unintentional weight loss, chest pain, palpitations, wheezing, dyspnea on exertion, nausea, vomiting, abdominal pain, dysuria, hematuria, melena, numbness, weakness, or tingling.   All other systems have been reviewed and were otherwise negative with the exception of those mentioned in the HPI and as above.    PHYSICAL EXAM: Filed Vitals:   12/23/14 1127  BP: 131/87  Pulse: 79  Temp: 97.9 F (36.6 C)  Resp: 18   Body mass index is 40.41 kg/(m^2).   General: Alert, no acute distress HEENT:  Normocephalic, atraumatic, oropharynx patent. Eye: Nonie H[facility] Cardiovascular:  Regular rate and rhythm, no rubs murmurs or gallops.  No Carotid bruits, radial pulse intact. No pedal edema.  Respiratory: Clear to auscultation bilaterally.  No wheezes, rales, or rhonchi.  No cyanosis, no use of accessory musculature Abdominal: No  organomegaly, abdomen is soft and non-tender, positive bowel sounds.  No masses. Musculoskeletal: Gait intact. No edema, tenderness Skin: No rashes. Neurologic: Facial musculature symmetric. Psychiatric: Patient acts appropriately throughout our interaction. Lymphatic: No cervical or submandibular lymphadenopathy   LABS: Results for orders placed or performed in visit on 12/23/14  POCT glycosylated hemoglobin (Hb A1C)  Result Value Ref Range   Hemoglobin A1C 6.2      EKG/XRAY:   Primary read [facility]. Large plantar spur visible no fracture of the heel.   ASSESSMENT/PLAN: Patient struggling with her weight. I encouraged her to continue exercising. She is going to review the medicines that are available for weight loss that she could try for short-term. She will let me know which of these she wants to try. We will continue to hold off on bypass surgery. Her hemoglobin A1c is stable at 6.2.   Gross sideeffects, risk and benefits, and alternatives of medications d/w patient. Patient is aware that all medications have potential sideeffects and we are unable to predict every sideeffect or drug-drug interaction that may occur.  Jodi ChrisSteven Mickenzie Stolar MD 12/23/2014 11:29 AM

## 2014-12-26 ENCOUNTER — Encounter: Payer: Self-pay | Admitting: Gastroenterology

## 2014-12-29 ENCOUNTER — Telehealth: Payer: Self-pay

## 2014-12-29 NOTE — Telephone Encounter (Signed)
Called patient to offer appt w/ NP-Megan Millikan. No answer.   

## 2015-01-05 ENCOUNTER — Encounter: Payer: 59 | Attending: Surgery

## 2015-01-05 DIAGNOSIS — Z713 Dietary counseling and surveillance: Secondary | ICD-10-CM | POA: Diagnosis not present

## 2015-01-05 DIAGNOSIS — Z6841 Body Mass Index (BMI) 40.0 and over, adult: Secondary | ICD-10-CM | POA: Diagnosis not present

## 2015-01-05 NOTE — Progress Notes (Signed)
  Pre-Operative Nutrition Class:  Appt start time: 830   End time:  930.  Patient was seen on 01/05/2015 for Pre-Operative Bariatric Surgery Education at [facility].   Surgery date: 01/26/15 Surgery type: Sleeve gastrectomy Start weight at [facility]: 241 lbs on 01/25/2014 Weight today: 236.5 lbs  TANITA  BODY COMP RESULTS  01/05/15   BMI (kg/m^2) 39.4   Fat Mass (lbs) 122   Fat Free Mass (lbs) 114   Total Body Water (lbs) 84   Samples given per MNT protocol. Patient educated on appropriate usage: Premier protein shake (vanilla - qty 1) Lot #: [id number] Exp: 06/2015  Celebrate Calcium Citrate chew (caramel - qty 1) Lot #: [id number] Exp: 03/2016  PB2 (qty 1) Lot #: [id number] Exp: 10/2016  Renee Pain Protein Powder (chicken soup - qty 1) Lot #: [id number] Exp: 11/2015  The following the learning objectives were met by the patient during this course:  Identify Pre-Op Dietary Goals and will begin 2 weeks pre-operatively  Identify appropriate sources of fluids and proteins   State protein recommendations and appropriate sources pre and post-operatively  Identify Post-Operative Dietary Goals and will follow for 2 weeks post-operatively  Identify appropriate multivitamin and calcium sources  Describe the need for physical activity post-operatively and will follow MD recommendations  State when to call healthcare provider regarding medication questions or post-operative complications  Handouts given during class include:  Pre-Op Bariatric Surgery Diet Handout  Protein Shake Handout  Post-Op Bariatric Surgery Nutrition Handout  BELT Program Information Flyer  Support Group Information Flyer  [facility] Bariatric Supplements Price List  Follow-Up Plan: Patient will follow-up at [facility] 2 weeks post operatively for diet advancement per MD.

## 2015-01-08 ENCOUNTER — Other Ambulatory Visit: Payer: Self-pay | Admitting: Emergency Medicine

## 2015-01-09 ENCOUNTER — Other Ambulatory Visit: Payer: Self-pay | Admitting: Gastroenterology

## 2015-01-09 ENCOUNTER — Encounter: Payer: Self-pay | Admitting: Gastroenterology

## 2015-01-09 ENCOUNTER — Ambulatory Visit (AMBULATORY_SURGERY_CENTER): Payer: 59 | Admitting: Gastroenterology

## 2015-01-09 VITALS — BP 140/87 | HR 67 | Temp 99.1°F | Resp 19 | Ht 64.0 in | Wt 240.0 lb

## 2015-01-09 DIAGNOSIS — K222 Esophageal obstruction: Secondary | ICD-10-CM

## 2015-01-09 DIAGNOSIS — K2950 Unspecified chronic gastritis without bleeding: Secondary | ICD-10-CM | POA: Diagnosis not present

## 2015-01-09 DIAGNOSIS — R1310 Dysphagia, unspecified: Secondary | ICD-10-CM | POA: Diagnosis present

## 2015-01-09 DIAGNOSIS — Z1211 Encounter for screening for malignant neoplasm of colon: Secondary | ICD-10-CM

## 2015-01-09 DIAGNOSIS — K2990 Gastroduodenitis, unspecified, without bleeding: Secondary | ICD-10-CM | POA: Diagnosis not present

## 2015-01-09 DIAGNOSIS — K2970 Gastritis, unspecified, without bleeding: Secondary | ICD-10-CM | POA: Diagnosis not present

## 2015-01-09 DIAGNOSIS — R1013 Epigastric pain: Secondary | ICD-10-CM

## 2015-01-09 DIAGNOSIS — K259 Gastric ulcer, unspecified as acute or chronic, without hemorrhage or perforation: Secondary | ICD-10-CM | POA: Diagnosis not present

## 2015-01-09 MED ORDER — SODIUM CHLORIDE 0.9 % IV SOLN: 500.0000 mL | INTRAVENOUS | Status: DC

## 2015-01-09 NOTE — Patient Instructions (Signed)
YOU HAD AN ENDOSCOPIC PROCEDURE TODAY AT THE [facility]:   Refer to the procedure report that was given to you for any specific questions about what was found during the examination.  If the procedure report does not answer your questions, please call your gastroenterologist to clarify.  If you requested that your care partner not be given the details of your procedure findings, then the procedure report has been included in a sealed envelope for you to review at your convenience later.  YOU SHOULD EXPECT: Some feelings of bloating in the abdomen. Passage of more gas than usual.  Walking can help get rid of the air that was put into your GI tract during the procedure and reduce the bloating. If you had a lower endoscopy (such as a colonoscopy or flexible sigmoidoscopy) you may notice spotting of blood in your stool or on the toilet paper. If you underwent a bowel prep for your procedure, you may not have a normal bowel movement for a few days.  Please Note:  You might notice some irritation and congestion in your nose or some drainage.  This is from the oxygen used during your procedure.  There is no need for concern and it should clear up in a day or so.  SYMPTOMS TO REPORT IMMEDIATELY:   Following lower endoscopy (colonoscopy or flexible sigmoidoscopy):  Excessive amounts of blood in the stool  Significant tenderness or worsening of abdominal pains  Swelling of the abdomen that is new, acute  Fever of 100F or higher   For urgent or emergent issues, a gastroenterologist can be reached at any hour by calling [phone].   DIET: Your first meal following the procedure should be a small meal and then it is ok to progress to your normal diet. Heavy or fried foods are harder to digest and may make you feel nauseous or bloated.  Likewise, meals heavy in dairy and vegetables can increase bloating.  Drink plenty of fluids but you should avoid alcoholic beverages for 24  hours.  ACTIVITY:  You should plan to take it easy for the rest of today and you should NOT DRIVE or use heavy machinery until tomorrow (because of the sedation medicines used during the test).    FOLLOW UP: Our staff will call the number listed on your records the next business day following your procedure to check on you and address any questions or concerns that you may have regarding the information given to you following your procedure. If we do not reach you, we will leave a message.  However, if you are feeling well and you are not experiencing any problems, there is no need to return our call.  We will assume that you have returned to your regular daily activities without incident.  If any biopsies were taken you will be contacted by phone or by letter within the next 1-3 weeks.  Please call us at [phone] if you have not heard about the biopsies in 3 weeks.    SIGNATURES/CONFIDENTIALITY: You and/or your care partner have signed paperwork which will be entered into your electronic medical record.  These signatures attest to the fact that that the information above on your After Visit Summary has been reviewed and is understood.  Full responsibility of the confidentiality of this discharge information lies with you and/or your care-partner.  Diverticulosis handout given Continue taking Ranitidine Await pathology report

## 2015-01-09 NOTE — Op Note (Signed)
[facility] [address] [place], [id number]   ENDOSCOPY PROCEDURE REPORT  PATIENT: [name], [name]  MR#: [id number] BIRTHDATE: 1960/12/02 , 54  yrs. old GENDER: female ENDOSCOPIST: Rachael Feeaniel P Jacobs, MD PROCEDURE DATE:  01/09/2015 PROCEDURE:  EGD w/ biopsy and EGD w/ balloon dilation ASA CLASS:     Class II INDICATIONS:  pyrosis, intermittent dysphagia, dyspepsia. MEDICATIONS: Monitored anesthesia care and Propofol 200 mg IV TOPICAL ANESTHETIC: none  DESCRIPTION OF PROCEDURE: After the risks benefits and alternatives of the procedure were thoroughly explained, informed consent was obtained.  The LB [id number] endoscope was introduced through the mouth and advanced to the second portion of the duodenum , Without limitations.  The instrument was slowly withdrawn as the mucosa was fully examined.  There was mild to moderate pan-gastritis that was non-specific.  The stomach was biospied in antrum and body and sent to pathology (jar 1).  Along lesser curve, about 5-6cm from the pylorus there was more focally abnormal appearing erosive mucosa that was 2cm diameter.  This location was also biopsied (jar 2).  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  The GE junction stricture was dilated with TTS balloon held inflated to 1 minute.  The examination was otherwise normal.  Retroflexed views revealed no abnormalities. The scope was then withdrawn from the patient and the procedure completed. COMPLICATIONS: There were no immediate complications.  ENDOSCOPIC IMPRESSION: There was mild to moderate pan-gastritis that was non-specific.  The stomach was biospied in antrum and body and sent to pathology (jar 1).  Along lesser curve, about 5-6cm from the pylorus there was more focally abnormal appearing erosive mucosa that was 2cm diameter.  This location was also biopsied (jar 2).  There was a 2cm hiatal  hernia and a peptic appearing focal stricture at the GE junction with 16mm lumen.  The GE junction stricture was dilated with TTS balloon held inflated to 1 minute.  The examination was otherwise normal  RECOMMENDATIONS: Await final pathology results.  For now continue once daily H2 blocker, ranitidine.   eSigned:  Rachael Feeaniel P Jacobs, MD 01/09/2015 3:09 PM    CC: Earl LitesSteve Daub, MD

## 2015-01-09 NOTE — Progress Notes (Signed)
Report to PACU, RN, vss, BBS= Clear.  

## 2015-01-09 NOTE — Op Note (Signed)
[facility] [address] [place], [id number]   COLONOSCOPY PROCEDURE REPORT  PATIENT: [name], [name]  MR#: [id number] BIRTHDATE: 07-30-1960 , 54  yrs. old GENDER: female ENDOSCOPIST: Rachael Feeaniel P Jacobs, MD REFERRED MV:HQIONGBY:Steven Cleta Albertsaub, [name].D. PROCEDURE DATE:  01/09/2015 PROCEDURE:   Colonoscopy, diagnostic First Screening Colonoscopy - Avg.  risk and is 50 yrs.  old or older Yes.  Prior Negative Screening - Now for repeat screening. N/A  History of Adenoma - Now for follow-up colonoscopy & has been > or = to 3 yrs.  N/A  Recommend repeat exam, <10 yrs? No ASA CLASS:   Class II INDICATIONS:Screening for colonic neoplasia and Colorectal Neoplasm Risk Assessment for this procedure is average risk. MEDICATIONS: Monitored anesthesia care and Propofol 200 mg IV  DESCRIPTION OF PROCEDURE:   After the risks benefits and alternatives of the procedure were thoroughly explained, informed consent was obtained.  The digital rectal exam revealed no abnormalities of the rectum.   The LB [id number]  endoscope was introduced through the anus and advanced to the cecum, which was identified by both the appendix and ileocecal valve. No adverse events experienced.   The quality of the prep was excellent.  The instrument was then slowly withdrawn as the colon was fully examined. Estimated blood loss is zero unless otherwise noted in this procedure report.  COLON FINDINGS: There was mild diverticulosis noted in the left colon.   The examination was otherwise normal.  Retroflexed views revealed no abnormalities. The time to cecum = 2.0 Withdrawal time = 6.7   The scope was withdrawn and the procedure completed. COMPLICATIONS: There were no immediate complications.  ENDOSCOPIC IMPRESSION: 1.   Mild diverticulosis was noted in the left colon 2.   The examination was otherwise normal  RECOMMENDATIONS: You should continue to follow colorectal cancer screening guidelines for "routine  risk" patients with a repeat colonoscopy in 10 years.   eSigned:  Rachael Feeaniel P Jacobs, MD 01/09/2015 2:49 PM

## 2015-01-09 NOTE — Progress Notes (Signed)
Called to room to assist during endoscopic procedure.  Patient ID and intended procedure confirmed with present staff. Received instructions for my participation in the procedure from the performing physician.  

## 2015-01-13 ENCOUNTER — Other Ambulatory Visit: Payer: Self-pay

## 2015-01-13 ENCOUNTER — Telehealth: Payer: Self-pay | Admitting: *Deleted

## 2015-01-13 ENCOUNTER — Other Ambulatory Visit (HOSPITAL_BASED_OUTPATIENT_CLINIC_OR_DEPARTMENT_OTHER): Payer: 59

## 2015-01-13 DIAGNOSIS — D6851 Activated protein C resistance: Secondary | ICD-10-CM

## 2015-01-13 LAB — CBC WITH DIFFERENTIAL (CANCER CENTER ONLY)
BASO#: 0 10*3/uL (ref 0.0–0.2)
BASO%: 0.2 % (ref 0.0–2.0)
EOS ABS: 0.1 10*3/uL (ref 0.0–0.5)
EOS%: 0.7 % (ref 0.0–7.0)
HCT: 41.4 % (ref 34.8–46.6)
HGB: 13.5 g/dL (ref 11.6–15.9)
LYMPH#: 1.4 10*3/uL (ref 0.9–3.3)
LYMPH%: 16.7 % (ref 14.0–48.0)
MCH: 29.3 pg (ref 26.0–34.0)
MCHC: 32.6 g/dL (ref 32.0–36.0)
MCV: 90 fL (ref 81–101)
MONO#: 0.6 10*3/uL (ref 0.1–0.9)
MONO%: 7.4 % (ref 0.0–13.0)
NEUT#: 6.3 10*3/uL (ref 1.5–6.5)
NEUT%: 75 % (ref 39.6–80.0)
PLATELETS: 225 10*3/uL (ref 145–400)
RBC: 4.61 10*6/uL (ref 3.70–5.32)
RDW: 13.1 % (ref 11.1–15.7)
WBC: 8.4 10*3/uL (ref 3.9–10.0)

## 2015-01-13 LAB — COMPREHENSIVE METABOLIC PANEL
ALT: 14 U/L (ref 0–55)
ANION GAP: 11 meq/L (ref 3–11)
AST: 12 U/L (ref 5–34)
Albumin: 3.7 g/dL (ref 3.5–5.0)
Alkaline Phosphatase: 102 U/L (ref 40–150)
BILIRUBIN TOTAL: 0.48 mg/dL (ref 0.20–1.20)
BUN: 14.3 mg/dL (ref 7.0–26.0)
CHLORIDE: 105 meq/L (ref 98–109)
CO2: 25 meq/L (ref 22–29)
Calcium: 9.5 mg/dL (ref 8.4–10.4)
Creatinine: 0.9 mg/dL (ref 0.6–1.1)
EGFR: 73 mL/min/{1.73_m2} — AB (ref >90)
Glucose: 130 mg/dl (ref 70–140)
Potassium: 4.3 mEq/L (ref 3.5–5.1)
Sodium: 141 mEq/L (ref 136–145)
TOTAL PROTEIN: 7.4 g/dL (ref 6.4–8.3)

## 2015-01-13 LAB — IRON AND TIBC
%SAT: 8 % — AB (ref 21–57)
IRON: 31 ug/dL — AB (ref 41–142)
TIBC: 380 ug/dL (ref 236–444)
UIBC: 349 ug/dL (ref 120–384)

## 2015-01-13 NOTE — Telephone Encounter (Signed)
  Follow up Call-  Call back number 01/09/2015  Post procedure Call Back phone  # [phone]  Permission to leave phone message Yes     Patient questions:  Do you have a fever, pain , or abdominal swelling? No. Pain Score  0 *  Have you tolerated food without any problems? Yes.    Have you been able to return to your normal activities? Yes.    Do you have any questions about your discharge instructions: Diet   No. Medications  No. Follow up visit  No.  Do you have questions or concerns about your Care? No.  Actions: * If pain score is 4 or above: No action needed, pain <4.

## 2015-01-14 ENCOUNTER — Encounter: Payer: Self-pay | Admitting: Family

## 2015-01-14 ENCOUNTER — Ambulatory Visit: Payer: Self-pay | Admitting: Surgery

## 2015-01-14 ENCOUNTER — Ambulatory Visit (HOSPITAL_BASED_OUTPATIENT_CLINIC_OR_DEPARTMENT_OTHER): Payer: 59 | Admitting: Family

## 2015-01-14 VITALS — BP 134/78 | HR 79 | Temp 97.6°F | Resp 16 | Ht 64.0 in | Wt 236.0 lb

## 2015-01-14 DIAGNOSIS — D6851 Activated protein C resistance: Secondary | ICD-10-CM

## 2015-01-14 MED ORDER — RIVAROXABAN 15 MG PO TABS: 15.0000 mg | ORAL_TABLET | Freq: Every day | ORAL | Status: DC

## 2015-01-14 NOTE — Progress Notes (Signed)
Hematology and Oncology Follow Up Visit  [name] [id number] Dec 01, 1960 54 y.o. 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 Due to her history of DVT development after surgery several years ago we will have her take Xarelto before and after her procedure.  She is doing well and is asymptomatic at this time. She is very excited and looking forward to her weight loss journey.  No fever, chills, n/v, cough, rash, dizziness, SOB, chest pain, palpitations, abdominal pain or changes in bowel or bladder habits.  No swelling, tenderness, numbness or tingling in her extremities. No c/o aches or pains.  She has been following her diet closely and eating very healthy. She is making sure to stay well hydrated. She is currently taking aspirin 325 BID. She states that this gives her peace of mind.  She has had no episodes of bleeding or bruising.  She is also hoping to start a new job soon and was wondering if she would be safe to fly. She states that she does wear compression stockings, drinks lots of water and gets up to walk while travelling.    Medications:    Medication List       This list is accurate as of: 01/14/15 11:38 AM.  Always use your most recent med list.               aspirin 325 MG tablet  Take 325 mg by mouth 2 (two) times daily.     aspirin-acetaminophen-caffeine 250-250-65 MG tablet  Commonly known as:  EXCEDRIN MIGRAINE  Take 2 tablets by mouth every 6 (six) hours as needed for headache or migraine.     BIOTIN MAXIMUM STRENGTH 10 MG Tabs  Generic drug:  Biotin  Take 1 tablet by mouth 2 (two) times daily.     Co Q-10 100 MG Caps  Take 1 tablet by mouth daily.     CULTURELLE PO  Take 1 tablet by mouth daily.     FISH OIL PO  Take 750 mg  by mouth daily.     frovatriptan 2.5 MG tablet  Commonly known as:  FROVA  Take 1 tablet (2.5 mg total) by mouth as needed for migraine. If recurs, may repeat after 2 hours. Max of 3 tabs in 24 hours.     Garlic 1000 MG Caps  Take 1 capsule by mouth daily.     GLUCOSAMINE HCL PO  Take 2,000 mg by mouth 2 (two) times daily.     ibuprofen 200 MG tablet  Commonly known as:  ADVIL,MOTRIN  Take 400 mg by mouth every 6 (six) hours as needed for mild pain.     Magnesium 250 MG Tabs  Take 250 mg by mouth 2 (two) times daily.     Melatonin 1 MG/ML Liqd  Take 1 mg by mouth at bedtime.     ranitidine 150 MG capsule  Commonly known as:  ZANTAC  Take 150 mg by mouth daily. Reported on 01/09/2015     SUMAtriptan 6 MG/0.5ML Soaj  USE AS INSTRUCTED BY YOUR PRESCRIBER     TURMERIC PO  Take 1,000 mg by mouth 2 (two) times daily.     Vitamin D3 1000 units Caps  Take 1,000 Int'l Units by mouth 2 (two) times daily.  Allergies:  Allergies  Allergen Reactions  . Statins Other (See Comments)    Pt states she has severe joint and body aches.  . Codeine Hives    rash    Past Medical History, Surgical history, Social history, and Family History were reviewed and updated.  Review of Systems: All other 10 point review of systems is negative.   Physical Exam:  height is 5\' 4"  (1.626 m) and weight is 236 lb (107.049 kg). Her oral temperature is 97.6 F (36.4 C). Her blood pressure is 134/78 and her pulse is 79. Her respiration is 16.   Wt Readings from Last 3 Encounters:  01/14/15 236 lb (107.049 kg)  01/09/15 240 lb (108.863 kg)  01/05/15 236 lb 8 oz (107.276 kg)    Ocular: Sclerae unicteric, pupils equal, round and reactive to light Ear-nose-throat: Oropharynx clear, dentition fair Lymphatic: No cervical supraclavicular or axillary adenopathy Lungs no rales or rhonchi, good excursion bilaterally Heart regular rate and rhythm, no murmur appreciated Abd soft, nontender,  positive bowel sounds, no liver or spleen tip palpated on exam  MSK no focal spinal tenderness, no joint edema Neuro: non-focal, well-oriented, appropriate affect Breasts: Deferred  Lab Results  Component Value Date   WBC 8.4 01/13/2015   HGB 13.5 01/13/2015   HCT 41.4 01/13/2015   MCV 90 01/13/2015   PLT 225 01/13/2015   Lab Results  Component Value Date   IRON 31* 01/13/2015   TIBC 380 01/13/2015   UIBC 349 01/13/2015   IRONPCTSAT 8* 01/13/2015   Lab Results  Component Value Date   RBC 4.61 01/13/2015   No results found for: KPAFRELGTCHN, LAMBDASER, KAPLAMBRATIO No results found for: IGGSERUM, IGA, IGMSERUM No results found for: Marda StalkerOTALPROTELP, ALBUMINELP, A1GS, A2GS, BETS, BETA2SER, GAMS, MSPIKE, SPEI   Chemistry      Component Value Date/Time   NA 141 01/13/2015 0853   NA 140 12/25/2012 0814   K 4.3 01/13/2015 0853   K 4.6 12/25/2012 0814   CL 105 12/25/2012 0814   CO2 25 01/13/2015 0853   CO2 28 12/25/2012 0814   BUN 14.3 01/13/2015 0853   BUN 19 12/25/2012 0814   CREATININE 0.9 01/13/2015 0853   CREATININE 0.81 12/25/2012 0814      Component Value Date/Time   CALCIUM 9.5 01/13/2015 0853   CALCIUM 9.6 12/25/2012 0814   ALKPHOS 102 01/13/2015 0853   ALKPHOS 92 12/25/2012 0814   AST 12 01/13/2015 0853   AST 14 12/25/2012 0814   ALT 14 01/13/2015 0853   ALT 13 12/25/2012 0814   BILITOT 0.48 01/13/2015 0853   BILITOT 0.5 12/25/2012 0814     Impression and Plan: [name] is a pleasant 54 yo white female heterozygous for the Factor 5 Leiden mutation. She had 2 thromboembolic episodes several years apart. The last one occuring right after surgery. She will be having the gastric sleeve procedure on January 9th and needs anticoagulation before and after surgery.  We will have her take Xarelto 15 mg daily starting today and then stop 2 days before surgery. She will then resume the xarelto 15 mg daily the day after surgery and continue this for 4 weeks. Prescription  given to patient.  Once she completes this she can go back on to aspirin at 325 mg daily.  She is ok to fly and will continue to wear her compression stockings as needed at home and while traveling and drink plenty of fluids.  She will continue to follow-up with us as needed.  We are happy to see her for any of her hematologic needs.    Verdie Mosher, NP 12/28/201611:38 AM

## 2015-01-14 NOTE — H&P (Signed)
[name]. [facility] 01/14/2015 3:38 PM Location: [facility] Patient #: [id number] DOB: Jan 03, 1961 Divorced / Language: [place] / Race: White Female  History of Present Illness [name]([name]; 01/14/2015 4:07 PM) The patient is a 54 year old female who presents for a bariatric surgery evaluation. [name] is scheduled for sleeve gastrectomy on Jan 9th. She presents today with a weight of 237 and a BMI of 40. She has seen [name] and [name] for recurrent phlebitis and has been found to have a heterozygous mutation of the Leiden factor. We answered her questions regarding sleeve gastrectomy she is ready to proceed with surgery.   Allergies [name]([name]; 01/14/2015 3:39 PM) Codeine Phosphate *ANALGESICS - OPIOID*  Medication History ([name]; 01/14/2015 3:40 PM) OxyCODONE HCl (5MG /5ML Solution, 5-10 Milliliter Oral every four hours, as needed, Taken starting 01/14/2015) Active. Pantoprazole Sodium (40MG  Tablet DR, 1 (one) Tablet Oral daily, Taken starting 01/14/2015) Active. Ondansetron (4MG  Tablet Disperse, 1 (one) Tablet Oral every six hours, as needed, Taken starting 01/14/2015) Active. Crestor (5MG  Tablet, Oral) Active. Omeprazole (20MG  Capsule DR, Oral) Active. SUMAtriptan Succinate (6MG /0.5ML Soln Auto-inj, Subcutaneous) Active. Xarelto (20MG  Tablet, Oral) Active. Red Yeast Rice (600MG  Capsule, Oral) Active. Krill Oil (Oral) Active. Medications Reconciled    Vitals (Jodi Bynum [name]; 01/14/2015 3:39 PM) 01/14/2015 3:39 PM Weight: 237 lb Height: 64in Body Surface Area: 2.1 [name] Body Mass Index: 40.68 kg/[name]  Temp.: 24F(Temporal)  Pulse: 81 (Regular)  BP: 130/76 (Sitting, Left Arm, Standard)      Physical Exam ([name]; 01/14/2015 4:08 PM)  General Note: Obese white female in no acute distress. Today's weight is 237 with a BMI of 40. HEENT exam unremarkable  Neck supple Chest clear to  auscultation Heart sinus rhythm without murmurs or gallops  Abdomen nontender. Only prior surgeries tubal ligation extremity exam for range of motion. He has a history of superficial phlebitis and some varicosities. Neuro alert and oriented 3. Emergency function grossly intact.    Assessment & Plan [name]([name]; 01/14/2015 4:10 PM)  MORBID OBESITY (E66.01) Impression: For sleeve gastrectomy on Jan 9th. To start Xarelto and stop 2 days preop and restart on PD 1.

## 2015-01-15 ENCOUNTER — Telehealth: Payer: Self-pay

## 2015-01-15 ENCOUNTER — Telehealth: Payer: Self-pay | Admitting: Oncology

## 2015-01-15 NOTE — Telephone Encounter (Signed)
Spoke with Gala Romneyoug at [facility] re: prior authorization for [employer] Xarelto. Per Gala Romneyoug, drug is approved. Case ID [id number]. Pt notified by phone. dph

## 2015-01-15 NOTE — Telephone Encounter (Signed)
-----   Message from Josph MachoPeter R Ennever, MD sent at 01/14/2015  7:15 AM EST ----- Call - vit D is very low!! She needs to be taking 2000units q day.  pete

## 2015-01-15 NOTE — Telephone Encounter (Addendum)
-----   Message from Josph MachoPeter R Ennever, MD sent at 01/14/2015  7:15 AM EST ----- Call - vit D is very low!! She needs to be taking 2000units q day.  Pete 5:09 PM Spoke with patient, instructed to take 2000U daily, verbalized understanding.

## 2015-01-16 ENCOUNTER — Telehealth: Payer: Self-pay

## 2015-01-16 NOTE — Telephone Encounter (Signed)
      I called pathology and they do not see the results either, they are looking into it and will call me back with an anwer    Harleigh Civello, can you check on these path results. I don't think they crossed over into epic.        Thanks        ----- Message -----     From: Ginette PitmanJane W Scott, RN     Sent: 01/15/2015  2:20 PM      To: Rachael Feeaniel P Jacobs, MD        Do we have anything as yet, I did not see in chart.     ----- Message -----     From: Collene GobbleSteven A Daub, MD     Sent: 01/15/2015  1:13 PM      To: Ginette PitmanJane W Scott, RN        Do you have the biopsy results of her stomach?. I believe she had her gastric sleeve placed today for weight loss surgery    ----- Message -----     From: Ginette PitmanJane W Scott, RN     Sent: 01/15/2015 11:10 AM      To: Collene GobbleSteven A Daub, MD

## 2015-01-16 NOTE — Telephone Encounter (Signed)
Ok, thanks.

## 2015-01-16 NOTE — Telephone Encounter (Signed)
Path was delayed going out and will be going to the lab today

## 2015-01-17 LAB — FACTOR 5 LEIDEN

## 2015-01-17 LAB — FOLATE: Folate: 11.7 ng/mL

## 2015-01-17 LAB — VITAMIN D 25 HYDROXY (VIT D DEFICIENCY, FRACTURES): VIT D 25 HYDROXY: 19 ng/mL — AB (ref 30–100)

## 2015-01-17 LAB — VITAMIN B12: Vitamin B-12: 440 pg/mL (ref 211–911)

## 2015-01-20 ENCOUNTER — Encounter (HOSPITAL_COMMUNITY)
Admission: RE | Admit: 2015-01-20 | Discharge: 2015-01-20 | Disposition: A | Discharge status: Home / Self Care | Payer: 59 | Source: Ambulatory Visit | Attending: Surgery | Admitting: Surgery

## 2015-01-21 ENCOUNTER — Ambulatory Visit: Payer: 59 | Admitting: Neurology

## 2015-01-21 ENCOUNTER — Telehealth: Payer: Self-pay | Admitting: *Deleted

## 2015-01-21 NOTE — Telephone Encounter (Signed)
Called to cancel morning of appt due to sickness. 

## 2015-01-22 ENCOUNTER — Ambulatory Visit (INDEPENDENT_AMBULATORY_CARE_PROVIDER_SITE_OTHER): Payer: 59 | Admitting: Emergency Medicine

## 2015-01-22 ENCOUNTER — Ambulatory Visit (INDEPENDENT_AMBULATORY_CARE_PROVIDER_SITE_OTHER): Payer: 59

## 2015-01-22 ENCOUNTER — Encounter: Payer: Self-pay | Admitting: Emergency Medicine

## 2015-01-22 VITALS — BP 110/86 | HR 88 | Temp 99.1°F | Resp 16 | Ht 65.0 in | Wt 230.2 lb

## 2015-01-22 DIAGNOSIS — J0100 Acute maxillary sinusitis, unspecified: Secondary | ICD-10-CM

## 2015-01-22 DIAGNOSIS — R05 Cough: Secondary | ICD-10-CM | POA: Diagnosis not present

## 2015-01-22 DIAGNOSIS — R059 Cough, unspecified: Secondary | ICD-10-CM

## 2015-01-22 DIAGNOSIS — R062 Wheezing: Secondary | ICD-10-CM | POA: Diagnosis not present

## 2015-01-22 LAB — POCT CBC
Granulocyte percent: 71.9 %G (ref 37–80)
HCT, POC: 40.6 % (ref 37.7–47.9)
Hemoglobin: 13.7 g/dL (ref 12.2–16.2)
LYMPH, POC: 1.4 (ref 0.6–3.4)
MCH, POC: 29.4 pg (ref 27–31.2)
MCHC: 33.9 g/dL (ref 31.8–35.4)
MCV: 86.8 fL (ref 80–97)
MID (cbc): 0.5 (ref 0–0.9)
MPV: 8.6 fL (ref 0–99.8)
PLATELET COUNT, POC: 212 10*3/uL (ref 142–424)
POC Granulocyte: 5 (ref 2–6.9)
POC LYMPH %: 20.9 % (ref 10–50)
POC MID %: 7.2 %M (ref 0–12)
RBC: 4.67 M/uL (ref 4.04–5.48)
RDW, POC: 12.7 %
WBC: 6.9 10*3/uL (ref 4.6–10.2)

## 2015-01-22 MED ORDER — ALBUTEROL SULFATE HFA 108 (90 BASE) MCG/ACT IN AERS: 2.0000 | INHALATION_SPRAY | RESPIRATORY_TRACT | Status: DC | PRN

## 2015-01-22 MED ORDER — BENZONATATE 100 MG PO CAPS: 100.0000 mg | ORAL_CAPSULE | Freq: Three times a day (TID) | ORAL | Status: DC | PRN

## 2015-01-22 MED ORDER — ALBUTEROL SULFATE (2.5 MG/3ML) 0.083% IN NEBU
2.5000 mg | INHALATION_SOLUTION | Freq: Once | RESPIRATORY_TRACT | Status: AC
  Administered 2015-01-22: 2.5 mg via RESPIRATORY_TRACT

## 2015-01-22 MED ORDER — AZITHROMYCIN 250 MG PO TABS: ORAL_TABLET | ORAL | Status: DC

## 2015-01-22 NOTE — Patient Instructions (Signed)
We recommend that you schedule a mammogram for breast cancer screening. Typically, you do not need a referral to do this. Please contact a local imaging center to schedule your mammogram.  [facility] - [phone]  *ask for the Radiology Department [facility] - [phone] or [phone]  [facility] - [phone] [facility] - [phone] [facility] - [phone]  *ask for the Radiology Department [facility] - [phone]  *ask for the Radiology Department [facility] - [phone]  *ask for the Mammography Department [facility] - [phone] 

## 2015-01-22 NOTE — Progress Notes (Addendum)
Patient ID: [name] [facility], female   DOB: [birth year], 55 y.o.   MRN: [id number]     By signing my name below, I, Littie Deeds, attest that this documentation has been prepared under the direction and in the presence of [name], MD.  Electronically Signed: Littie Deeds, Medical Scribe. 01/22/2015. 11:07 AM.   Chief Complaint:  Chief Complaint  Patient presents with  . Head and chest congestion    x 1wk, green-light shade of brown mucus    HPI: [name] is a 55 y.o. female who reports to [facility] today complaining of gradual onset congestion in her head and chest that started 6 days ago. Patient reports having associated cough and rhinorrhea with green sputum. She believes she may have had a fever 2-3 days ago. She tried Mucinex which did improve her symptoms, but notes her symptoms worsened again. She also tried Mucinex DM but without relief. She has received her flu shot this year and has had a pneumonia vaccine.  Immunization History  Administered Date(s) Administered  . Influenza,inj,Quad PF,36+ Mos 12/25/2012, 09/16/2014  . Pneumococcal Polysaccharide-23 07/17/2014     Past Medical History  Diagnosis Date  . Allergy   . Migraine   . Chest tightness   . Hyperlipidemia   . Anxiety   . Depression   . Factor V deficiency (HCC)   . Blood clot in vein     right leg  . Varicose vein   . Morbid obesity with BMI of 40.0-44.9, adult [facility])    Past Surgical History  Procedure Laterality Date  . Tubal ligation  2006  . Vein ligation Bilateral    Social History   Social History  . Marital Status: Divorced    Spouse Name: N/A  . Number of Children: 0  . Years of Education: 12   Occupational History  . 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 Smoking status: Never Smoker   . Smokeless tobacco: Never Used  . Alcohol Use: 0.6 oz/week    1 Glasses of wine per week     Comment: OCC  . Drug Use: No  . Sexual Activity: Not Asked   Other  Topics Concern  . None   Social History Narrative   Patient is divorced. Patient has a high school education and works for [employer].    Right handed.   Caffeine- two daily            Family History  Problem Relation Age of Onset  . Diabetes Mother 13    type 2  . Diabetes Father 36    type 2  . Dementia Father 46  . Hyperlipidemia Father   . Diabetes Sister   . Hyperlipidemia Sister   . Hypertension Sister   . Diabetes Brother   . Heart disease Brother   . Hyperlipidemia Brother   . Hypertension Brother   . Breast cancer Sister    Allergies  Allergen Reactions  . Statins Other (See Comments)    Pt states she has severe joint and body aches.  . Codeine Hives    rash   Prior to Admission medications   Medication Sig Start Date End Date Taking? Authorizing Provider  aspirin 325 MG tablet Take 325 mg by mouth 2 (two) times daily.   Yes Historical Provider, MD  aspirin-acetaminophen-caffeine (EXCEDRIN MIGRAINE) [phone] MG tablet Take 2 tablets by mouth every 6 (six) hours as needed for headache or migraine.  Yes Historical Provider, MD  Biotin (BIOTIN MAXIMUM STRENGTH) 10 MG TABS Take 1 tablet by mouth 2 (two) times daily.    Yes Historical Provider, MD  Cholecalciferol (VITAMIN D3) 1000 UNITS CAPS Take 1,000 Int'l Units by mouth 2 (two) times daily.    Yes Historical Provider, MD  Coenzyme Q10 (CO Q-10) 100 MG CAPS Take 1 tablet by mouth daily.   Yes Historical Provider, MD  frovatriptan (FROVA) 2.5 MG tablet Take 1 tablet (2.5 mg total) by mouth as needed for migraine. If recurs, may repeat after 2 hours. Max of 3 tabs in 24 hours. 11/19/14  Yes Levert FeinsteinYijun Yan, MD  Garlic 1000 MG CAPS Take 1 capsule by mouth daily.   Yes Historical Provider, MD  GLUCOSAMINE HCL PO Take 2,000 mg by mouth 2 (two) times daily.    Yes Historical Provider, MD  ibuprofen (ADVIL,MOTRIN) 200 MG tablet Take 400 mg by mouth every 6 (six) hours as needed for mild pain.   Yes Historical Provider, MD    Lactobacillus Rhamnosus, GG, (CULTURELLE PO) Take 1 tablet by mouth daily.    Yes Historical Provider, MD  Magnesium 250 MG TABS Take 250 mg by mouth 2 (two) times daily.   Yes Historical Provider, MD  Melatonin 1 MG/ML LIQD Take 1 mg by mouth at bedtime. Patient taking differently: Take 1-2 mg by mouth at bedtime as needed (sleep).  11/19/14  Yes Levert FeinsteinYijun Yan, MD  Omega-3 Fatty Acids (FISH OIL PO) Take 750 mg by mouth daily.    Yes Historical Provider, MD  ranitidine (ZANTAC) 150 MG capsule Take 150 mg by mouth daily. Reported on 01/09/2015   Yes Historical Provider, MD  SUMAtriptan 6 MG/0.5ML SOAJ USE AS INSTRUCTED BY YOUR PRESCRIBER Patient taking differently: INJECT AS NEEDED FOR MIGRAINES 11/24/14  Yes Levert FeinsteinYijun Yan, MD  TURMERIC PO Take 1,000 mg by mouth 2 (two) times daily.   Yes Historical Provider, MD  Rivaroxaban (XARELTO) 15 MG TABS tablet Take 1 tablet (15 mg total) by mouth daily. Take 15 mg daily starting today. Stop 2 days before surgery. Resume the day after surgery and take 15 mg daily for 4 more weeks then can stop. Patient not taking: Reported on 01/22/2015 01/14/15   Verdie MosherSarah M [place], NP     ROS: The patient denies chills, night sweats, unintentional weight loss, chest pain, palpitations, wheezing, dyspnea on exertion, nausea, vomiting, abdominal pain, dysuria, hematuria, melena, numbness, weakness, or tingling.   All other systems have been reviewed and were otherwise negative with the exception of those mentioned in the HPI and as above.    PHYSICAL EXAM: Filed Vitals:   01/22/15 1029  BP: 110/86  Pulse: 88  Temp: 99.1 F (37.3 C)  Resp: 16   Body mass index is 38.31 kg/(m^2).   General: Alert, no acute distress HEENT:  Normocephalic, atraumatic, oropharynx patent. Ears normal. Significant nasal congestion. Redness posterior pharynx. Eye: Nonie HoyerOMI, [facility] Cardiovascular:  Regular rate and rhythm, no rubs murmurs or gallops.  No Carotid bruits, radial pulse intact. No  pedal edema.  Respiratory: Bilateral rales, no rhonchi. Breath sounds symmetrical. Abdominal: No organomegaly, abdomen is soft and non-tender, positive bowel sounds.  No masses. Musculoskeletal: Gait intact. No edema, tenderness Skin: No rashes. Neurologic: Facial musculature symmetric. Psychiatric: Patient acts appropriately throughout our interaction. Lymphatic: No cervical or submandibular lymphadenopathy    LABS: Results for orders placed or performed in visit on 01/22/15  POCT CBC  Result Value Ref Range   WBC 6.9 4.6 -  10.2 K/uL   Lymph, poc 1.4 0.6 - 3.4   POC LYMPH PERCENT 20.9 10 - 50 %L   MID (cbc) 0.5 0 - 0.9   POC MID % 7.2 0 - 12 %M   POC Granulocyte 5.0 2 - 6.9   Granulocyte percent 71.9 37 - 80 %G   RBC 4.67 4.04 - 5.48 M/uL   Hemoglobin 13.7 12.2 - 16.2 g/dL   HCT, POC 16.1 09.6 - 47.9 %   MCV 86.8 80 - 97 fL   MCH, POC 29.4 27 - 31.2 pg   MCHC 33.9 31.8 - 35.4 g/dL   RDW, POC 04.5 %   Platelet Count, POC 212 142 - 424 K/uL   MPV 8.6 0 - 99.8 fL   Meds ordered this encounter  Medications  . albuterol (PROVENTIL) (2.5 MG/3ML) 0.083% nebulizer solution 2.5 mg    Sig:   . azithromycin (ZITHROMAX) 250 MG tablet    Sig: Take 2 tabs PO x 1 dose, then 1 tab PO QD x 4 days    Dispense:  6 tablet    Refill:  0  . benzonatate (TESSALON) 100 MG capsule    Sig: Take 1-2 capsules (100-200 mg total) by mouth 3 (three) times daily as needed for cough.    Dispense:  40 capsule    Refill:  0  . albuterol (PROVENTIL HFA;VENTOLIN HFA) 108 (90 Base) MCG/ACT inhaler    Sig: Inhale 2 puffs into the lungs every 4 (four) hours as needed for wheezing or shortness of breath (cough, shortness of breath or wheezing.).    Dispense:  1 Inhaler    Refill:  1    EKG/XRAY:   Primary read interpreted by Dr. Cleta Alberts at [facility]. Question left Basilar mild airspace disease   ASSESSMENT/PLAN: I suspect she had a respiratory illness followed by secondary sinusitis bronchitis. She will be  treated with Zithromax, Tessalon Perles, and an albuterol inhaler. She understands to force fluids and rest.I personally performed the services described in this documentation, which was scribed in my presence. The recorded information has been reviewed and is accurate.    Gross sideeffects, risk and benefits, and alternatives of medications d/w patient. Patient is aware that all medications have potential sideeffects and we are unable to predict every sideeffect or drug-drug interaction that may occur.  [name] MD 01/22/2015 11:07 AM

## 2015-01-26 ENCOUNTER — Inpatient Hospital Stay (HOSPITAL_COMMUNITY): Admission: RE | Admit: 2015-01-26 | Payer: 59 | Source: Ambulatory Visit | Admitting: Surgery

## 2015-01-26 ENCOUNTER — Encounter (HOSPITAL_COMMUNITY): Admission: RE | Payer: Self-pay | Source: Ambulatory Visit

## 2015-01-26 SURGERY — GASTRECTOMY, SLEEVE, LAPAROSCOPIC
Anesthesia: General

## 2015-01-28 ENCOUNTER — Telehealth: Payer: Self-pay

## 2015-01-28 NOTE — Telephone Encounter (Signed)
Attention: Dr. Belva Bertinaub-Pt was seen on 1/5/ by Dr. Cleta Albertsaub for Cough - Primary. She is not feeling any better and still has heavy congestion. She would like to know if there is anything we can do for her or if she should come back in? Pharm[facility]:  [id number] - [place], [place] - [address] [facility] [address] CB # [phone]

## 2015-01-28 NOTE — Telephone Encounter (Signed)
I called and spoke with patient. I told her to give her illness another 48 hours. If she is not better by then will call in a different antibiotic. She has no drug allergies by history.

## 2015-02-04 ENCOUNTER — Ambulatory Visit (INDEPENDENT_AMBULATORY_CARE_PROVIDER_SITE_OTHER): Payer: 59 | Admitting: Neurology

## 2015-02-04 ENCOUNTER — Encounter: Payer: Self-pay | Admitting: Neurology

## 2015-02-04 VITALS — BP 132/89 | HR 76 | Ht 65.0 in | Wt 229.0 lb

## 2015-02-04 DIAGNOSIS — E119 Type 2 diabetes mellitus without complications: Secondary | ICD-10-CM

## 2015-02-04 DIAGNOSIS — G43009 Migraine without aura, not intractable, without status migrainosus: Secondary | ICD-10-CM

## 2015-02-04 MED ORDER — VENLAFAXINE HCL ER 37.5 MG PO CP24: ORAL_CAPSULE | ORAL | Status: DC

## 2015-02-04 MED ORDER — TOPIRAMATE ER 100 MG PO CAP24: ORAL_CAPSULE | ORAL | Status: DC

## 2015-02-04 NOTE — Progress Notes (Signed)
Chief Complaint  Patient presents with  . Migraine    She was unable to start Trokendi XR because it was not covered by insurance.  She has a new plan and would like to try it now.  She has had an increase in headaches recently. Reports having to use triptans, in addition to, getting six pain injections over the last three weeks.      PATIENT: [name] [facility] DOB: [birth year]  Chief Complaint  Patient presents with  . Migraine    She was unable to start Trokendi XR because it was not covered by insurance.  She has a new plan and would like to try it now.  She has had an increase in headaches recently. Reports having to use triptans, in addition to, getting six pain injections over the last three weeks.     HISTORICAL  [name] is a 55 year old Caucasian female, came in for Botox injection for chronic migraine headaches.  HISTORY: She has a history of chronic migraine headaches for a long time, increased since 2008, her migraine usually start at one side, occipital region, spreading forward, throbbing severe headache, with smell and light and noise sensitivity,  lasting 2-3 days  About half times in a month, she would have some degree of headaches, out of those headaches, about 2-5 are severe long-lasting headache, lasting about 2-3 days,  over the past few years, she has tried different preventive medications, Topamax, cause confusion, difficulty talking, propanolol, she could not tolerate, and doesn't help, antidepression-excessive fatigue, no help.  She was able to identify trigger of her migraine, stress, menstruation period of time, sometimes woke up with severe headache,  she is currently taking Frova and Imitrex tablets, which helped her majority of the time, this is her first Botox injection as migraine prevention  She responded very well to last Botox injection, she began to notice increased frequency of headaches since June 2014, about once a week, at the maximum benefit  to Botox, she only has one to 2 headaches each month, Frova has always been able to take care of her headache, but she usually tried Imitrex tablets first, 20% of time she has to take second tablet by the afternoon, she previously responded to Imitrex subcutaneous injection very well, she worried about losing benefit of Frova with repeat use.  UPDATE June 24th 2015: She is doing well with Imitrex injection, she still has some recurrent headaches in past 3 months, responding well with imitrex injections.  UPDATE Nov 19 2014: Last BOTOX injection was in June 2015.  She lost job in June 26 2014.  She is gong through a lot stress. She complains of increased headaches since October 2016, few times a week, has to use Imitrex injection sometimes repeat dosing a day, followed by Frova next day, does help her headaches,  Previously she has tried Topamax, complains of slow thinking, which does help her headaches   UPDATE Feb 04 2015: Last BOTOX injection was in June 2015, she complains of difficulty breathing, especially at night times, after BOTOX wearing off, she began to notice improvement. She had 6 imitrex injection in Jan 2017, which did help her headache.  She had slurred words, mild memory loss with topamax.  She still looks for a job.   REVIEW OF SYSTEMS: Full 14 system review of systems performed and notable only for activity change, appetite change, chill, fever, runny nose, cough, wheezing, chest tightness, nausea, frequent awakening, joint pain, achy muscles, dizziness, headaches, depression  ALLERGIES: Allergies  Allergen Reactions  . Statins Other (See Comments)    Pt states she has severe joint and body aches.  . Codeine Hives    rash    HOME MEDICATIONS: Current Outpatient Prescriptions  Medication Sig Dispense Refill  . albuterol (PROVENTIL HFA;VENTOLIN HFA) 108 (90 Base) MCG/ACT inhaler Inhale 2 puffs into the lungs every 4 (four) hours as needed for wheezing or shortness of breath  (cough, shortness of breath or wheezing.). 1 Inhaler 1  . aspirin 325 MG tablet Take 325 mg by mouth 2 (two) times daily.    Marland Kitchen aspirin-acetaminophen-caffeine (EXCEDRIN MIGRAINE) 250-250-65 MG tablet Take 2 tablets by mouth every 6 (six) hours as needed for headache or migraine.    . benzonatate (TESSALON) 100 MG capsule Take 1-2 capsules (100-200 mg total) by mouth 3 (three) times daily as needed for cough. 40 capsule 0  . Biotin (BIOTIN MAXIMUM STRENGTH) 10 MG TABS Take 1 tablet by mouth 2 (two) times daily.     . Cholecalciferol (VITAMIN D3) 1000 UNITS CAPS Take 1,000 Int'l Units by mouth 2 (two) times daily.     . Coenzyme Q10 (CO Q-10) 100 MG CAPS Take 1 tablet by mouth daily.    . frovatriptan (FROVA) 2.5 MG tablet Take 1 tablet (2.5 mg total) by mouth as needed for migraine. If recurs, may repeat after 2 hours. Max of 3 tabs in 24 hours. 15 tablet 11  . Garlic 1000 MG CAPS Take 1 capsule by mouth daily.    Marland Kitchen GLUCOSAMINE HCL PO Take 2,000 mg by mouth 2 (two) times daily.     Marland Kitchen ibuprofen (ADVIL,MOTRIN) 200 MG tablet Take 400 mg by mouth every 6 (six) hours as needed for mild pain.    . Lactobacillus Rhamnosus, GG, (CULTURELLE PO) Take 1 tablet by mouth daily.     . Magnesium 250 MG TABS Take 250 mg by mouth 2 (two) times daily.    . Melatonin 1 MG/ML LIQD Take 1 mg by mouth at bedtime. (Patient taking differently: Take 1-2 mg by mouth at bedtime as needed (sleep). ) 58 mL 11  . Omega-3 Fatty Acids (FISH OIL PO) Take 750 mg by mouth daily.     . ranitidine (ZANTAC) 150 MG capsule Take 150 mg by mouth daily. Reported on 01/09/2015    . SUMAtriptan 6 MG/0.5ML SOAJ USE AS INSTRUCTED BY YOUR PRESCRIBER (Patient taking differently: INJECT AS NEEDED FOR MIGRAINES) 6 mL prn  . TURMERIC PO Take 1,000 mg by mouth 2 (two) times daily.     No current facility-administered medications for this visit.    PAST MEDICAL HISTORY: Past Medical History  Diagnosis Date  . Allergy   . Migraine   . Chest  tightness   . Hyperlipidemia   . Anxiety   . Depression   . Factor V deficiency (HCC)   . Blood clot in vein     right leg  . Varicose vein   . Morbid obesity with BMI of 40.0-44.9, adult (HCC)     PAST SURGICAL HISTORY: Past Surgical History  Procedure Laterality Date  . Tubal ligation  2006  . Vein ligation Bilateral     FAMILY HISTORY: Family History  Problem Relation Age of Onset  . Diabetes Mother 2    type 2  . Diabetes Father 41    type 2  . Dementia Father 65  . Hyperlipidemia Father   . Diabetes Sister   . Hyperlipidemia Sister   . Hypertension Sister   .  Diabetes Brother   . Heart disease Brother   . Hyperlipidemia Brother   . Hypertension Brother   . Breast cancer Sister     SOCIAL HISTORY:  Social History   Social History  . Marital Status: Divorced    Spouse Name: N/A  . Number of Children: 0  . Years of Education: 12   Occupational History  . 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 Smoking status: Never Smoker   . Smokeless tobacco: Never Used  . Alcohol Use: 0.6 oz/week    1 Glasses of wine per week     Comment: OCC  . Drug Use: No  . Sexual Activity: Not on file   Other Topics Concern  . Not on file   Social History Narrative   Patient is divorced. Patient has a high school education and works for [employer].    Right handed.   Caffeine- two daily              PHYSICAL EXAM   Filed Vitals:   02/04/15 0902  BP: 132/89  Pulse: 76  Height:  (1.651 m)  Weight: 229 lb (103.874 kg)    Not recorded      Body mass index is 38.11 kg/(m^2).  PHYSICAL EXAMNIATION:  Gen: NAD, conversant, well nourised, obese, well groomed                     Cardiovascular: Regular rate rhythm, no peripheral edema, warm, nontender. Eyes: Conjunctivae clear without exudates or hemorrhage Neck: Supple, no carotid bruise. Pulmonary: Clear to auscultation bilaterally   NEUROLOGICAL EXAM:  MENTAL  STATUS: Speech:    Speech is normal; fluent and spontaneous with normal comprehension.  Cognition:     Orientation to time, place and person     Normal recent and remote memory     Normal Attention span and concentration     Normal Language, naming, repeating,spontaneous speech     Fund of knowledge   CRANIAL NERVES: CN II: Visual fields are full to confrontation. Fundoscopic exam is normal with sharp discs and no vascular changes. Pupils are round equal and briskly reactive to light. CN III, IV, VI: extraocular movement are normal. No ptosis. CN V: Facial sensation is intact to pinprick in all 3 divisions bilaterally. Corneal responses are intact.  CN VII: Face is symmetric with normal eye closure and smile. CN VIII: Hearing is normal to rubbing fingers CN IX, X: Palate elevates symmetrically. Phonation is normal. CN XI: Head turning and shoulder shrug are intact CN XII: Tongue is midline with normal movements and no atrophy.  MOTOR: There is no pronator drift of out-stretched arms. Muscle bulk and tone are normal. Muscle strength is normal.  REFLEXES: Reflexes are 2+ and symmetric at the biceps, triceps, knees, and ankles. Plantar responses are flexor.  SENSORY: Intact to light touch, pinprick, position sense, and vibration sense are intact in fingers and toes.  COORDINATION: Rapid alternating movements and fine finger movements are intact. There is no dysmetria on finger-to-nose and heel-knee-shin.    GAIT/STANCE: Posture is normal. Gait is steady with normal steps, base, arm swing, and turning. Heel and toe walking are normal. Tandem gait is normal.  Romberg is absent.   DIAGNOSTIC DATA (LABS, IMAGING, TESTING) - I reviewed patient records, labs, notes, testing and imaging myself where available.   ASSESSMENT AND PLAN  [name] is a 55 y.o. female   Worsening Chronic  migraine  Trokendi ER 100 mg titrating to 2 tablets every night as migraine  prevention  Continue Imitrex injection as needed, Imitrex injection as needed for moderate to severe migraines Anxiety  Add on Effexor XR 37.5 1-2 tabs po qhs.  Levert Feinstein, M.D. Ph.D.  [facility] [address], [address] [place], [place] [address] Ph: [phone] Fax: [phone]  CC: Referring Provider

## 2015-02-05 ENCOUNTER — Other Ambulatory Visit: Payer: Self-pay | Admitting: *Deleted

## 2015-02-05 ENCOUNTER — Telehealth: Payer: Self-pay | Admitting: *Deleted

## 2015-02-05 MED ORDER — TOPIRAMATE 100 MG PO TABS: 100.0000 mg | ORAL_TABLET | Freq: Every day | ORAL | Status: DC

## 2015-02-05 NOTE — Telephone Encounter (Signed)
Trokendi XR was denied by insurance - will only cover for seizures.  Dr. Terrace Arabia has sent in a new rx for topiramate , qd.  Patient aware and agreeable to try.

## 2015-02-05 NOTE — Addendum Note (Signed)
Addended by: Levert Feinstein on: 02/05/2015 02:18 PM   Modules accepted: Orders, Medications

## 2015-02-10 ENCOUNTER — Ambulatory Visit: Payer: 59

## 2015-02-11 ENCOUNTER — Telehealth: Payer: Self-pay | Admitting: Neurology

## 2015-02-11 MED ORDER — SUMATRIPTAN SUCCINATE 6 MG/0.5ML ~~LOC~~ SOAJ: 0.5000 mL | SUBCUTANEOUS | Status: DC | PRN

## 2015-02-11 NOTE — Telephone Encounter (Signed)
Patient called to request SUMAtriptan 6 MG/0.5ML SOAJ be transferred to [facility]

## 2015-02-27 ENCOUNTER — Telehealth: Payer: Self-pay | Admitting: Neurology

## 2015-02-27 NOTE — Telephone Encounter (Signed)
Called patient to schedule injection and left a VM asking her to call me back.

## 2015-03-26 ENCOUNTER — Ambulatory Visit: Payer: 59 | Admitting: Neurology

## 2015-04-21 ENCOUNTER — Telehealth: Payer: Self-pay | Admitting: Neurology

## 2015-04-21 NOTE — Telephone Encounter (Signed)
Pt requested to speak with Duwayne Heckanielle

## 2015-04-21 NOTE — Telephone Encounter (Signed)
Spoke with the patient and scheduled her injection and delivery for medication on 04/05.

## 2015-05-04 ENCOUNTER — Ambulatory Visit (INDEPENDENT_AMBULATORY_CARE_PROVIDER_SITE_OTHER): Payer: 59 | Admitting: Neurology

## 2015-05-04 ENCOUNTER — Encounter: Payer: Self-pay | Admitting: Neurology

## 2015-05-04 VITALS — BP 125/81 | HR 73 | Ht 65.0 in | Wt 212.0 lb

## 2015-05-04 DIAGNOSIS — G43719 Chronic migraine without aura, intractable, without status migrainosus: Secondary | ICD-10-CM

## 2015-05-04 MED ORDER — TOPIRAMATE 100 MG PO TABS: 100.0000 mg | ORAL_TABLET | Freq: Every day | ORAL | Status: DC

## 2015-05-04 MED ORDER — SUMATRIPTAN SUCCINATE 6 MG/0.5ML ~~LOC~~ SOAJ: 0.5000 mL | SUBCUTANEOUS | Status: DC | PRN

## 2015-05-04 NOTE — Progress Notes (Signed)
Chief Complaint  Patient presents with  . Migraine    Botox - specialty pharmacy      [name]B: 25-Jan-1960  Chief Complaint  Patient presents with  . Migraine    Botox - specialty pharmacy     HISTORICAL  [name] M [place] is a 55 year old Caucasian female, came in for Botox injection for chronic migraine headaches.  HISTORY: She has a history of chronic migraine headaches for a long time, increased since 2008, her migraine usually start at one side, occipital region, spreading forward, throbbing severe headache, with smell and light and noise sensitivity,  lasting 2-3 days  About half times in a month, she would have some degree of headaches, out of those headaches, about 2-5 are severe long-lasting headache, lasting about 2-3 days,  over the past few years, she has tried different preventive medications, Topamax, cause confusion, difficulty talking, propanolol, she could not tolerate, and doesn't help, antidepression-excessive fatigue, no help.  She was able to identify trigger of her migraine, stress, menstruation period of time, sometimes woke up with severe headache,  she is currently taking Frova and Imitrex tablets, which helped her majority of the time, this is her first Botox injection as migraine prevention  She responded very well to last Botox injection, she began to notice increased frequency of headaches since June 2014, about once a week, at the maximum benefit to Botox, she only has one to 2 headaches each month, Frova has always been able to take care of her headache, but she usually tried Imitrex tablets first, 20% of time she has to take second tablet by the afternoon, she previously responded to Imitrex subcutaneous injection very well, she worried about losing benefit of Frova with repeat use.  UPDATE June 24th 2015: She is doing well with Imitrex injection, she still has some recurrent headaches in past 3 months, responding well with imitrex  injections.  UPDATE Nov 19 2014: Last BOTOX injection was in June 2015.  She lost job in June 26 2014.  She is gong through a lot stress. She complains of increased headaches since October 2016, few times a week, has to use Imitrex injection sometimes repeat dosing a day, followed by Frova next day, does help her headaches,  Previously she has tried Topamax, complains of slow thinking, which does help her headaches   UPDATE Feb 04 2015: Last BOTOX injection was in June 2015, she complains of difficulty breathing, especially at night times, after BOTOX wearing off, she began to notice improvement. She had 6 imitrex injection in Jan 2017, which did help her headache.  She had slurred words, mild memory loss with topamax.  She still looks for a job.  UPDATE May 04 2015: She return for Botox injection as migraine prevention, she continue has frequent migraine headaches, require Imitrex injection,  REVIEW OF SYSTEMS: Full 14 system review of systems performed and notable only for activity change, appetite change, chill, fever, runny nose, cough, wheezing, chest tightness, nausea, frequent awakening, joint pain, achy muscles, dizziness, headaches, depression  ALLERGIES: Allergies  Allergen Reactions  . Statins Other (See Comments)    Pt states she has severe joint and body aches.  . Codeine Hives    rash    HOME MEDICATIONS: Current Outpatient Prescriptions  Medication Sig Dispense Refill  . aspirin 325 MG tablet Take 325 mg by mouth 2 (two) times daily.    Marland Kitchen. aspirin-acetaminophen-caffeine (EXCEDRIN MIGRAINE) 250-250-65 MG tablet Take 2 tablets by mouth every 6 (six)  hours as needed for headache or migraine.    . Biotin (BIOTIN MAXIMUM STRENGTH) 10 MG TABS Take 1 tablet by mouth 2 (two) times daily.     . Cholecalciferol (VITAMIN D3) 1000 UNITS CAPS Take 1,000 Int'l Units by mouth 2 (two) times daily.     . Coenzyme Q10 (CO Q-10) 100 MG CAPS Take 1 tablet by mouth daily.    . frovatriptan  (FROVA) 2.5 MG tablet Take 1 tablet (2.5 mg total) by mouth as needed for migraine. If recurs, may repeat after 2 hours. Max of 3 tabs in 24 hours. 15 tablet 11  . Garlic 1000 MG CAPS Take 1 capsule by mouth daily.    Marland Kitchen GLUCOSAMINE HCL PO Take 2,000 mg by mouth 2 (two) times daily.     Marland Kitchen ibuprofen (ADVIL,MOTRIN) 200 MG tablet Take 400 mg by mouth every 6 (six) hours as needed for mild pain.    . Lactobacillus Rhamnosus, GG, (CULTURELLE PO) Take 1 tablet by mouth daily.     . Magnesium 250 MG TABS Take 250 mg by mouth 2 (two) times daily.    . Melatonin 1 MG/ML LIQD Take 1 mg by mouth at bedtime. (Patient taking differently: Take 1-2 mg by mouth at bedtime as needed (sleep). ) 58 mL 11  . Omega-3 Fatty Acids (FISH OIL PO) Take 750 mg by mouth daily.     . ranitidine (ZANTAC) 150 MG capsule Take 150 mg by mouth daily. Reported on 01/09/2015    . SUMAtriptan 6 MG/0.5ML SOAJ Inject 0.5 mLs into the skin as needed (migraine.  May repeat once in 2 hours if needed). 6 mL 1  . topiramate (TOPAMAX) 100 MG tablet Take 1 tablet (100 mg total) by mouth daily. 30 tablet 11  . TURMERIC PO Take 1,000 mg by mouth 2 (two) times daily.    Marland Kitchen venlafaxine XR (EFFEXOR XR) 37.5 MG 24 hr capsule One po qhs xone week, then 2 tabs po qhs 60 capsule 11   No current facility-administered medications for this visit.    PAST MEDICAL HISTORY: Past Medical History  Diagnosis Date  . Allergy   . Migraine   . Chest tightness   . Hyperlipidemia   . Anxiety   . Depression   . Factor V deficiency (HCC)   . Blood clot in vein     right leg  . Varicose vein   . Morbid obesity with BMI of 40.0-44.9, adult (HCC)     PAST SURGICAL HISTORY: Past Surgical History  Procedure Laterality Date  . Tubal ligation  2006  . Vein ligation Bilateral     FAMILY HISTORY: Family History  Problem Relation Age of Onset  . Diabetes Mother 21    type 2  . Diabetes Father 40    type 2  . Dementia Father 37  . Hyperlipidemia  Father   . Diabetes Sister   . Hyperlipidemia Sister   . Hypertension Sister   . Diabetes Brother   . Heart disease Brother   . Hyperlipidemia Brother   . Hypertension Brother   . Breast cancer Sister     SOCIAL HISTORY:  Social History   Social History  . Marital Status: Divorced    Spouse Name: N/A  . Number of Children: 0  . Years of Education: 12   Occupational History  . 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 Smoking status: Never Smoker   . Smokeless tobacco: Never Used  .  Alcohol Use: 0.6 oz/week    1 Glasses of wine per week     Comment: OCC  . Drug Use: No  . Sexual Activity: Not on file   Other Topics Concern  . Not on file   Social History Narrative   Patient is divorced. Patient has a high school education and works for [employer].    Right handed.   Caffeine- two daily              PHYSICAL EXAM   Filed Vitals:   05/04/15 0758  BP: 125/81  Pulse: 73  Height: 5\' 5"  (1.651 m)  Weight: 212 lb (96.163 kg)    Not recorded      Body mass index is 35.28 kg/(m^2).  PHYSICAL EXAMNIATION:  Gen: NAD, conversant, well nourised, obese, well groomed                     Cardiovascular: Regular rate rhythm, no peripheral edema, warm, nontender. Eyes: Conjunctivae clear without exudates or hemorrhage Neck: Supple, no carotid bruise. Pulmonary: Clear to auscultation bilaterally   NEUROLOGICAL EXAM:  MENTAL STATUS: Speech:    Speech is normal; fluent and spontaneous with normal comprehension.  Cognition:     Orientation to time, place and person     Normal recent and remote memory     Normal Attention span and concentration     Normal Language, naming, repeating,spontaneous speech     Fund of knowledge   CRANIAL NERVES: CN II: Visual fields are full to confrontation. Fundoscopic exam is normal with sharp discs and no vascular changes. Pupils are round equal and briskly reactive to light. CN III, IV, VI:  extraocular movement are normal. No ptosis. CN V: Facial sensation is intact to pinprick in all 3 divisions bilaterally. Corneal responses are intact.  CN VII: Face is symmetric with normal eye closure and smile. CN VIII: Hearing is normal to rubbing fingers CN IX, X: Palate elevates symmetrically. Phonation is normal. CN XI: Head turning and shoulder shrug are intact CN XII: Tongue is midline with normal movements and no atrophy.  MOTOR: There is no pronator drift of out-stretched arms. Muscle bulk and tone are normal. Muscle strength is normal.  REFLEXES: Reflexes are 2+ and symmetric at the biceps, triceps, knees, and ankles. Plantar responses are flexor.  SENSORY: Intact to light touch, pinprick, position sense, and vibration sense are intact in fingers and toes.  COORDINATION: Rapid alternating movements and fine finger movements are intact. There is no dysmetria on finger-to-nose and heel-knee-shin.    GAIT/STANCE: Posture is normal. Gait is steady with normal steps, base, arm swing, and turning. Heel and toe walking are normal. Tandem gait is normal.  Romberg is absent.   DIAGNOSTIC DATA (LABS, IMAGING, TESTING) - I reviewed patient records, labs, notes, testing and imaging myself where available.   ASSESSMENT AND PLAN  ASHANTE YELLIN is a 55 y.o. female   Worsening Chronic migraine  Trokendi ER 100 mg titrating to 2 tablets every night as migraine prevention  Continue Imitrex injection as needed, Imitrex injection as needed for moderate to severe migraines  BOTOX injection was performed according to protocol by [employer]. 100 units of BOTOX was dissolved into 2 cc NS.   total of  200   Corrugator 2 sites, 10 units Procerus 1 site, 5 unit Frontalis 4 sites,  20 units, Temporalis 8 sites,  40 units  Occipitalis 6 sites, 30 units Cervical Paraspinal, 4 sites, 20 units Trapezius,  6 sites, 30 units  Extra 45 units was injected to left skull 15 units, divided into 3  injection sites,  Bilateral masseters, 15 units each side.  Patient tolerate the injection well.  . Anxiety  Add on Effexor XR 37.5 1-2 tabs po qhs.  Levert Feinstein, M.D. Ph.D.  [facility] [address] [place], [place] [address] Ph: [phone] Fax: [phone]  CC: Referring Provider

## 2015-05-04 NOTE — Progress Notes (Signed)
**  Botox 200 unit vial x 1, Lot [id number], Exp 10/2017, [facility] [id number], specialty pharmacy**mck,rn.

## 2015-07-06 ENCOUNTER — Telehealth: Payer: Self-pay | Admitting: Neurology

## 2015-07-06 ENCOUNTER — Other Ambulatory Visit: Payer: Self-pay | Admitting: *Deleted

## 2015-07-06 MED ORDER — VENLAFAXINE HCL ER 75 MG PO CP24: 75.0000 mg | ORAL_CAPSULE | Freq: Every day | ORAL | Status: DC

## 2015-07-06 NOTE — Telephone Encounter (Signed)
Insurance will cover one capsule daily of venlafaxine XR - she is taking 2 capsules 37.5mg  and needs it changed to 75mg , 1 capsule daily.  Insurance requires this change since the medication is commercially available in the higher dose capsule. Rx provided to [facility].  No PA required.

## 2015-07-06 NOTE — Telephone Encounter (Signed)
Patient is calling and states [facility] says they have sent us paperwork for authorization and a Rx venlafaxine 37.5 mg 24 hr capsules.  Please call. Thanks!

## 2015-07-09 ENCOUNTER — Encounter: Payer: Self-pay | Admitting: Emergency Medicine

## 2015-07-09 ENCOUNTER — Ambulatory Visit (INDEPENDENT_AMBULATORY_CARE_PROVIDER_SITE_OTHER): Payer: 59 | Admitting: Emergency Medicine

## 2015-07-09 VITALS — BP 116/86 | HR 84 | Temp 98.1°F | Resp 16 | Ht 64.75 in | Wt 205.4 lb

## 2015-07-09 DIAGNOSIS — E119 Type 2 diabetes mellitus without complications: Secondary | ICD-10-CM | POA: Diagnosis not present

## 2015-07-09 DIAGNOSIS — B029 Zoster without complications: Secondary | ICD-10-CM

## 2015-07-09 LAB — POCT GLYCOSYLATED HEMOGLOBIN (HGB A1C): Hemoglobin A1C: 5.8

## 2015-07-09 LAB — GLUCOSE, POCT (MANUAL RESULT ENTRY): POC Glucose: 137 mg/dl — AB (ref 70–99)

## 2015-07-09 MED ORDER — GABAPENTIN 100 MG PO CAPS: ORAL_CAPSULE | ORAL | Status: DC

## 2015-07-09 MED ORDER — VALACYCLOVIR HCL 1 G PO TABS: 1000.0000 mg | ORAL_TABLET | Freq: Three times a day (TID) | ORAL | Status: DC

## 2015-07-09 NOTE — Patient Instructions (Addendum)
   IF you received an x-ray today, you will receive an invoice from [facility]. Please contact [facility] at [phone] with questions or concerns regarding your invoice.   IF you received labwork today, you will receive an invoice from [facility]. Please contact [facility] at [phone] with questions or concerns regarding your invoice.   Our billing staff will not be able to assist you with questions regarding bills from these companies.  You will be contacted with the lab results as soon as they are available. The fastest way to get your results is to activate your My Chart account. Instructions are located on the last page of this paperwork. If you have not heard from us regarding the results in 2 weeks, please contact this office.    Shingles Shingles, which is also known as herpes zoster, is an infection that causes a painful skin rash and fluid-filled blisters. Shingles is not related to genital herpes, which is a sexually transmitted infection.   Shingles only develops in people who:  Have had chickenpox.  Have received the chickenpox vaccine. (This is rare.) CAUSES Shingles is caused by varicella-zoster virus (VZV). This is the same virus that causes chickenpox. After exposure to VZV, the virus stays in the body in an inactive (dormant) state. Shingles develops if the virus reactivates. This can happen many years after the initial exposure to VZV. It is not known what causes this virus to reactivate. RISK FACTORS People who have had chickenpox or received the chickenpox vaccine are at risk for shingles. Infection is more common in people who:  Are older than age 50.  Have a weakened defense (immune) system, such as those with HIV, AIDS, or cancer.  Are taking medicines that weaken the immune system, such as transplant medicines.  Are under great stress. SYMPTOMS Early symptoms of this condition include itching, tingling, and  pain in an area on your skin. Pain may be described as burning, stabbing, or throbbing. A few days or weeks after symptoms start, a painful red rash appears, usually on one side of the body in a bandlike or beltlike pattern. The rash eventually turns into fluid-filled blisters that break open, scab over, and dry up in about 2-3 weeks. At any time during the infection, you may also develop:  A fever.  Chills.  A headache.  An upset stomach. DIAGNOSIS This condition is diagnosed with a skin exam. Sometimes, skin or fluid samples are taken from the blisters before a diagnosis is made. These samples are examined under a microscope or sent to a lab for testing. TREATMENT There is no specific cure for this condition. Your health care provider will probably prescribe medicines to help you manage pain, recover more quickly, and avoid long-term problems. Medicines may include:  Antiviral drugs.  Anti-inflammatory drugs.  Pain medicines. If the area involved is on your face, you may be referred to a specialist, such as an eye doctor (ophthalmologist) or an ear, nose, and throat (ENT) doctor to help you avoid eye problems, chronic pain, or disability. HOME CARE INSTRUCTIONS Medicines  Take medicines only as directed by your health care provider.  Apply an anti-itch or numbing cream to the affected area as directed by your health care provider. Blister and Rash Care  Take a cool bath or apply cool compresses to the area of the rash or blisters as directed by your health care provider. This may help with pain and itching.  Keep your rash covered with a   loose bandage (dressing). Wear loose-fitting clothing to help ease the pain of material rubbing against the rash.  Keep your rash and blisters clean with mild soap and cool water or as directed by your health care provider.  Check your rash every day for signs of infection. These include redness, swelling, and pain that lasts or increases.  Do  not pick your blisters.  Do not scratch your rash. General Instructions  Rest as directed by your health care provider.  Keep all follow-up visits as directed by your health care provider. This is important.  Until your blisters scab over, your infection can cause chickenpox in people who have never had it or been vaccinated against it. To prevent this from happening, avoid contact with other people, especially:  Babies.  Pregnant women.  Children who have eczema.  Elderly people who have transplants.  People who have chronic illnesses, such as leukemia or AIDS. SEEK MEDICAL CARE IF:  Your pain is not relieved with prescribed medicines.  Your pain does not get better after the rash heals.  Your rash looks infected. Signs of infection include redness, swelling, and pain that lasts or increases. SEEK IMMEDIATE MEDICAL CARE IF:  The rash is on your face or nose.  You have facial pain, pain around your eye area, or loss of feeling on one side of your face.  You have ear pain or you have ringing in your ear.  You have loss of taste.  Your condition gets worse.   This information is not intended to replace advice given to you by your health care provider. Make sure you discuss any questions you have with your health care provider.   Document Released: 01/03/2005 Document Revised: 01/24/2014 Document Reviewed: 11/14/2013 Elsevier Interactive Patient Education 2016 Elsevier Inc.   

## 2015-07-09 NOTE — Progress Notes (Signed)
By signing my name below, I, Stann Oresung-Kai Tsai, attest that this documentation has been prepared under the direction and in the presence of Lesle ChrisSteven Byrd Rushlow, MD. Electronically Signed: Stann Oresung-Kai Tsai, Scribe. 07/09/2015 , 8:32 AM .  Patient was seen in room 21 .  Chief Complaint:  Chief Complaint  Patient presents with  . Rash    abdominal are to LEFT side x 5 days, per patient she thinks it Shingles    HPI: [name] is a 55 y.o. female [facility] complaining of rash over her left abdominal that started 5 days ago. Patient is here for follow up of DM. She's been working on diet and weight loss but hasn't had much exercise. She also complains of 5 days history of painful pruritic rash over her left flank and underneath her left breast.   Past Medical History  Diagnosis Date  . Allergy   . Migraine   . Chest tightness   . Hyperlipidemia   . Anxiety   . Depression   . Factor V deficiency (HCC)   . Blood clot in vein     right leg  . Varicose vein   . Morbid obesity with BMI of 40.0-44.9, adult [facility](HCC)    Past Surgical History  Procedure Laterality Date  . Tubal ligation  2006  . Vein ligation Bilateral    Social History   Social History  . Marital Status: Divorced    Spouse Name: N/A  . Number of Children: 0  . Years of Education: 12   Occupational History  . 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 Smoking status: Never Smoker   . Smokeless tobacco: Never Used  . Alcohol Use: 0.6 oz/week    1 Glasses of wine per week     Comment: OCC  . Drug Use: No  . Sexual Activity: Not Asked   Other Topics Concern  . None   Social History Narrative   Patient is divorced. Patient has a high school education and works for [employer].    Right handed.   Caffeine- two daily            Family History  Problem Relation Age of Onset  . Diabetes Mother 3870    type 2  . Diabetes Father 1868    type 2  . Dementia Father 4680    . Hyperlipidemia Father   . Diabetes Sister   . Hyperlipidemia Sister   . Hypertension Sister   . Diabetes Brother   . Heart disease Brother   . Hyperlipidemia Brother   . Hypertension Brother   . Breast cancer Sister    Allergies  Allergen Reactions  . Statins Other (See Comments)    Pt states she has severe joint and body aches.  . Codeine Hives    rash   Prior to Admission medications   Medication Sig Start Date End Date Taking? Authorizing Provider  aspirin 325 MG tablet Take 325 mg by mouth 2 (two) times daily.   Yes Historical Provider, MD  aspirin-acetaminophen-caffeine (EXCEDRIN MIGRAINE) [phone] MG tablet Take 2 tablets by mouth every 6 (six) hours as needed for headache or migraine.   Yes Historical Provider, MD  Biotin (BIOTIN MAXIMUM STRENGTH) 10 MG TABS Take 1 tablet by mouth 2 (two) times daily.    Yes Historical Provider, MD  Cholecalciferol (VITAMIN D3) 1000 UNITS CAPS Take 1,000 Int'l Units by mouth 2 (two) times daily.  Yes Historical Provider, MD  Coenzyme Q10 (CO Q-10) 100 MG CAPS Take 1 tablet by mouth daily.   Yes Historical Provider, MD  frovatriptan (FROVA) 2.5 MG tablet Take 1 tablet (2.5 mg total) by mouth as needed for migraine. If recurs, may repeat after 2 hours. Max of 3 tabs in 24 hours. 11/19/14  Yes Levert FeinsteinYijun Yan, MD  Garlic 1000 MG CAPS Take 1 capsule by mouth daily.   Yes Historical Provider, MD  GLUCOSAMINE HCL PO Take 2,000 mg by mouth 2 (two) times daily.    Yes Historical Provider, MD  ibuprofen (ADVIL,MOTRIN) 200 MG tablet Take 400 mg by mouth every 6 (six) hours as needed for mild pain.   Yes Historical Provider, MD  Lactobacillus Rhamnosus, GG, (CULTURELLE PO) Take 1 tablet by mouth daily.    Yes Historical Provider, MD  Magnesium 250 MG TABS Take 250 mg by mouth 2 (two) times daily.   Yes Historical Provider, MD  Melatonin 1 MG/ML LIQD Take 1 mg by mouth at bedtime. Patient taking differently: Take 1-2 mg by mouth at bedtime as needed  (sleep).  11/19/14  Yes Levert FeinsteinYijun Yan, MD  Omega-3 Fatty Acids (FISH OIL PO) Take 750 mg by mouth daily.    Yes Historical Provider, MD  ranitidine (ZANTAC) 150 MG capsule Take 150 mg by mouth daily. Reported on 01/09/2015   Yes Historical Provider, MD  SUMAtriptan 6 MG/0.5ML SOAJ Inject 0.5 mLs into the skin as needed (migraine.  May repeat once in 2 hours if needed). 05/04/15  Yes Levert FeinsteinYijun Yan, MD  topiramate (TOPAMAX) 100 MG tablet Take 1 tablet (100 mg total) by mouth daily. 05/04/15  Yes Levert FeinsteinYijun Yan, MD  TURMERIC PO Take 1,000 mg by mouth 2 (two) times daily.   Yes Historical Provider, MD  venlafaxine XR (EFFEXOR-XR) 75 MG 24 hr capsule Take 1 capsule (75 mg total) by mouth daily. 07/06/15  Yes Levert FeinsteinYijun Yan, MD  gabapentin (NEURONTIN) 100 MG capsule 1-2 capsules 3 times a day 07/09/15   Collene GobbleSteven A Cherity Blickenstaff, MD  valACYclovir (VALTREX) 1000 MG tablet Take 1 tablet (1,000 mg total) by mouth 3 (three) times daily. 07/09/15   Collene GobbleSteven A Jasier Calabretta, MD     ROS:  Constitutional: negative for fever, chills, night sweats, weight changes, or fatigue  HEENT: negative for vision changes, hearing loss, congestion, rhinorrhea, ST, epistaxis, or sinus pressure Cardiovascular: negative for chest pain or palpitations Respiratory: negative for hemoptysis, wheezing, shortness of breath, or cough Abdominal: negative for abdominal pain, nausea, vomiting, diarrhea, or constipation Dermatological: positive for rash Neurologic: negative for headache, dizziness, or syncope All other systems reviewed and are otherwise negative with the exception to those above and in the HPI.  PHYSICAL EXAM: Filed Vitals:   07/09/15 0755  BP: 116/86  Pulse: 84  Temp: 98.1 F (36.7 C)  Resp: 16   Body mass index is 34.43 kg/(m^2).   General: Alert, no acute distress HEENT:  Normocephalic, atraumatic, oropharynx patent. [facility]OMI[facility] Cardiovascular:  Regular rate and rhythm, no rubs murmurs or gallops.  No Carotid bruits, radial pulse intact. No  pedal edema.  Respiratory: Clear to auscultation bilaterally.  No wheezes, rales, or rhonchi.  No cyanosis, no use of accessory musculature Abdominal: No organomegaly, abdomen is soft and non-tender, positive bowel sounds. No masses. Musculoskeletal: Gait intact. No edema, tenderness Skin: shingles eruption T6 distribution on left Neurologic: Facial musculature symmetric. Psychiatric: Patient acts appropriately throughout our interaction.  Lymphatic: No cervical or submandibular lymphadenopathy Genitourinary/Anorectal: No acute findings  LABS: Results for orders placed or performed in visit on 07/09/15  POCT glucose (manual entry)  Result Value Ref Range   POC Glucose 137 (A) 70 - 99 mg/dl  POCT glycosylated hemoglobin (Hb A1C)  Result Value Ref Range   Hemoglobin A1C 5.8     EKG/XRAY:     ASSESSMENT/PLAN:  Patient has done great. Her hemoglobin A1c is now in the nondiabetic range. She will continue with her weight loss program and add exercise. She needs a check up in 6 months. She has a shingles eruption left side T6 distribution. She will be treated with Neurontin and Valtrex. We'll follow-up at 102 in about 1 week.I personally performed the services described in this documentation, which was scribed in my presence. The recorded information has been reviewed and is accurate. Gross sideeffects, risk and benefits, and alternatives of medications d/w patient. Patient is aware that all medications have potential sideeffects and we are unable to predict every sideeffect or drug-drug interaction that may occur.  [name] MD 07/09/2015 8:32 AM

## 2015-07-13 ENCOUNTER — Telehealth: Payer: Self-pay | Admitting: Neurology

## 2015-07-13 NOTE — Telephone Encounter (Signed)
Patient Venlafaxine XR 75 mg one bottle Qty 90 days  came buy mail today to office called and left patient a voice mail relaying medication will be at front desk for her to pick up. Relayed office hours.

## 2015-07-13 NOTE — Telephone Encounter (Signed)
FYI-Patient confirmed vmail received regarding medication that was delivered to our office, states it will probably be later in the week before she can pick up, she currently has shingles.

## 2015-07-13 NOTE — Telephone Encounter (Signed)
Noted  

## 2015-07-17 ENCOUNTER — Ambulatory Visit (INDEPENDENT_AMBULATORY_CARE_PROVIDER_SITE_OTHER): Payer: 59 | Admitting: Emergency Medicine

## 2015-07-17 VITALS — BP 128/90 | HR 83 | Temp 98.4°F | Resp 18 | Ht 64.75 in | Wt 207.0 lb

## 2015-07-17 DIAGNOSIS — B029 Zoster without complications: Secondary | ICD-10-CM

## 2015-07-17 DIAGNOSIS — E119 Type 2 diabetes mellitus without complications: Secondary | ICD-10-CM | POA: Diagnosis not present

## 2015-07-17 MED ORDER — GABAPENTIN 100 MG PO CAPS: ORAL_CAPSULE | ORAL | Status: DC

## 2015-07-17 NOTE — Progress Notes (Signed)
By signing my name below, I, [name], attest that this documentation has been prepared under the direction and in the presence of Lesle ChrisSteven Birt Reinoso, MD.  Electronically Signed: Arvilla MarketMesha Guinyard, Medical Scribe. 07/17/2015. 10:53 AM.  Chief Complaint:  Chief Complaint  Patient presents with  . Follow-up    shingles.     HPI: [name] is a 55 y.o. female [facility] follow-up on a shingles rash. Pt states she didn't have a lot of pain except for 2 days that was bareable. Pt states it's drying up. Pt states her back felt like it wanted to cramp. Pt has put on a silk cloth on the area to prevent discomfort, and has been wearing loose clothing. Pt has been staying cool to prevent her from sweating. Pt uses dove soap to wash her rash. Pt states she's been taking her medication regularly.  Pt's new PCP will be Dr. Dallas Schimkeopeland.  Past Medical History  Diagnosis Date  . Allergy   . Migraine   . Chest tightness   . Hyperlipidemia   . Anxiety   . Depression   . Factor V deficiency (HCC)   . Blood clot in vein     right leg  . Varicose vein   . Morbid obesity with BMI of 40.0-44.9, adult [facility](HCC)    Past Surgical History  Procedure Laterality Date  . Tubal ligation  2006  . Vein ligation Bilateral    Social History   Social History  . Marital Status: Divorced    Spouse Name: N/A  . Number of Children: 0  . Years of Education: 12   Occupational History  . 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 Smoking status: Never Smoker   . Smokeless tobacco: Never Used  . Alcohol Use: 0.6 oz/week    1 Glasses of wine per week     Comment: OCC  . Drug Use: No  . Sexual Activity: Not Asked   Other Topics Concern  . None   Social History Narrative   Patient is divorced. Patient has a high school education and works for [employer].    Right handed.   Caffeine- two daily            Family History  Problem Relation Age of  Onset  . Diabetes Mother 3970    type 2  . Diabetes Father 7468    type 2  . Dementia Father 2480  . Hyperlipidemia Father   . Diabetes Sister   . Hyperlipidemia Sister   . Hypertension Sister   . Diabetes Brother   . Heart disease Brother   . Hyperlipidemia Brother   . Hypertension Brother   . Breast cancer Sister    Allergies  Allergen Reactions  . Statins Other (See Comments)    Pt states she has severe joint and body aches.  . Codeine Hives    rash   Prior to Admission medications   Medication Sig Start Date End Date Taking? Authorizing Provider  aspirin 325 MG tablet Take 325 mg by mouth 2 (two) times daily.   Yes Historical Provider, MD  aspirin-acetaminophen-caffeine (EXCEDRIN MIGRAINE) [phone] MG tablet Take 2 tablets by mouth every 6 (six) hours as needed for headache or migraine.   Yes Historical Provider, MD  frovatriptan (FROVA) 2.5 MG tablet Take 1 tablet (2.5 mg total) by mouth as needed for migraine. If recurs, may repeat after 2 hours. Max of  3 tabs in 24 hours. 11/19/14  Yes Levert FeinsteinYijun Yan, MD  gabapentin (NEURONTIN) 100 MG capsule 1-2 capsules 3 times a day 07/09/15  Yes Collene GobbleSteven A Tayden Nichelson, MD  ibuprofen (ADVIL,MOTRIN) 200 MG tablet Take 400 mg by mouth every 6 (six) hours as needed for mild pain.   Yes Historical Provider, MD  Lactobacillus Rhamnosus, GG, (CULTURELLE PO) Take 1 tablet by mouth daily.    Yes Historical Provider, MD  Magnesium 250 MG TABS Take 250 mg by mouth 2 (two) times daily.   Yes Historical Provider, MD  Melatonin 1 MG/ML LIQD Take 1 mg by mouth at bedtime. Patient taking differently: Take 1-2 mg by mouth at bedtime as needed (sleep).  11/19/14  Yes Levert FeinsteinYijun Yan, MD  ranitidine (ZANTAC) 150 MG capsule Take 150 mg by mouth daily. Reported on 01/09/2015   Yes Historical Provider, MD  SUMAtriptan 6 MG/0.5ML SOAJ Inject 0.5 mLs into the skin as needed (migraine.  May repeat once in 2 hours if needed). 05/04/15  Yes Levert FeinsteinYijun Yan, MD  topiramate (TOPAMAX) 100 MG tablet  Take 1 tablet (100 mg total) by mouth daily. 05/04/15  Yes Levert FeinsteinYijun Yan, MD  TURMERIC PO Take 1,000 mg by mouth 2 (two) times daily.   Yes Historical Provider, MD  valACYclovir (VALTREX) 1000 MG tablet Take 1 tablet (1,000 mg total) by mouth 3 (three) times daily. 07/09/15  Yes Collene GobbleSteven A Ludmila Ebarb, MD  venlafaxine XR (EFFEXOR-XR) 75 MG 24 hr capsule Take 1 capsule (75 mg total) by mouth daily. 07/06/15  Yes Levert FeinsteinYijun Yan, MD     ROS: The patient denies fevers, chills, night sweats, unintentional weight loss, chest pain, palpitations, wheezing, dyspnea on exertion, nausea, vomiting, abdominal pain, dysuria, hematuria, melena, numbness, weakness, or tingling.  All other systems have been reviewed and were otherwise negative with the exception of those mentioned in the HPI and as above.    PHYSICAL EXAM: Filed Vitals:   07/17/15 0921  BP: 128/90  Pulse: 83  Temp: 98.4 F (36.9 C)  Resp: 18   Body mass index is 34.7 kg/(m^2).   General: Alert, no acute distress HEENT:  Normocephalic, atraumatic, oropharynx patent. [facility] Cardiovascular:  Regular rate and rhythm, no rubs murmurs or gallops.  No Carotid bruits, radial pulse intact. No pedal edema.  Respiratory: Clear to auscultation bilaterally.  No wheezes, rales, or rhonchi.  No cyanosis, no use of accessory musculature Abdominal: No organomegaly, abdomen is soft and non-tender, positive bowel sounds.  No masses. Musculoskeletal: Gait intact. No edema, tenderness Skin: Healing shingles erupting T7 T8 distribution. One 2 cm area noted. Neurologic: Facial musculature symmetric. Psychiatric: Patient acts appropriately throughout our interaction. Lymphatic: No cervical or submandibular lymphadenopathy  LABS:    EKG/XRAY:   Primary [facility]   ASSESSMENT/PLAN: Patient will continue Neurontin for pain. In about 7-10 days she can start using a moisturizing cream. She had one active lesion today and has  completed her course of Valtrex. Her diabetes follow-up will be in 6 months with Dr. Patsy Lageropland. She will see me in 3 months. I personally performed the services described in this documentation, which was scribed in my presence. The recorded information has been reviewed and is accurate.  Gross sideeffects, risk and benefits, and alternatives of medications d/w patient. Patient is aware that all medications have potential sideeffects and we are unable to predict every sideeffect or drug-drug interaction that may occur.  Lesle ChrisSteven Tahiry Spicer MD 07/17/2015 10:53 AM

## 2015-07-17 NOTE — Patient Instructions (Signed)
     IF you received an x-ray today, you will receive an invoice from [facility]. Please contact [facility] at [phone] with questions or concerns regarding your invoice.   IF you received labwork today, you will receive an invoice from [facility]. Please contact [facility] at [phone] with questions or concerns regarding your invoice.   Our billing staff will not be able to assist you with questions regarding bills from these companies.  You will be contacted with the lab results as soon as they are available. The fastest way to get your results is to activate your My Chart account. Instructions are located on the last page of this paperwork. If you have not heard from us regarding the results in 2 weeks, please contact this office.      

## 2015-07-30 ENCOUNTER — Encounter: Payer: Self-pay | Admitting: Neurology

## 2015-07-30 ENCOUNTER — Ambulatory Visit (INDEPENDENT_AMBULATORY_CARE_PROVIDER_SITE_OTHER): Payer: 59 | Admitting: Neurology

## 2015-07-30 VITALS — BP 123/83 | HR 73 | Ht 64.75 in | Wt 207.8 lb

## 2015-07-30 DIAGNOSIS — G43719 Chronic migraine without aura, intractable, without status migrainosus: Secondary | ICD-10-CM | POA: Diagnosis not present

## 2015-07-30 DIAGNOSIS — M792 Neuralgia and neuritis, unspecified: Secondary | ICD-10-CM | POA: Insufficient documentation

## 2015-07-30 HISTORY — DX: Neuralgia and neuritis, unspecified: M79.2

## 2015-07-30 MED ORDER — OXCARBAZEPINE 150 MG PO TABS: 150.0000 mg | ORAL_TABLET | Freq: Two times a day (BID) | ORAL | Status: DC

## 2015-07-30 MED ORDER — FROVATRIPTAN SUCCINATE 2.5 MG PO TABS: 2.5000 mg | ORAL_TABLET | ORAL | Status: DC | PRN

## 2015-07-30 NOTE — Progress Notes (Signed)
Chief Complaint  Patient presents with  . Migraine    Botox 200 units - specialty pharmacy.      PATIENT: [name] [facility] DOB: 1960-10-04  Chief Complaint  Patient presents with  . Migraine    Botox 200 units - specialty pharmacy.     HISTORICAL  [name] is a 55 year old Caucasian female, came in for Botox injection for chronic migraine headaches.  HISTORY: She has a history of chronic migraine headaches for a long time, increased since 2008, her migraine usually start at one side, occipital region, spreading forward, throbbing severe headache, with smell and light and noise sensitivity,  lasting 2-3 days  About half times in a month, she would have some degree of headaches, out of those headaches, about 2-5 are severe long-lasting headache, lasting about 2-3 days,  over the past few years, she has tried different preventive medications, Topamax, cause confusion, difficulty talking, propanolol, she could not tolerate, and doesn't help, antidepression-excessive fatigue, no help.  She was able to identify trigger of her migraine, stress, menstruation period of time, sometimes woke up with severe headache,  she is currently taking Frova and Imitrex tablets, which helped her majority of the time, this is her first Botox injection as migraine prevention  She responded very well to last Botox injection, she began to notice increased frequency of headaches since June 2014, about once a week, at the maximum benefit to Botox, she only has one to 2 headaches each month, Frova has always been able to take care of her headache, but she usually tried Imitrex tablets first, 20% of time she has to take second tablet by the afternoon, she previously responded to Imitrex subcutaneous injection very well, she worried about losing benefit of Frova with repeat use.  UPDATE June 24th 2015: She is doing well with Imitrex injection, she still has some recurrent headaches in past 3 months, responding  well with imitrex injections.  UPDATE Nov 19 2014: Last BOTOX injection was in June 2015.  She lost job in June 26 2014.  She is gong through a lot stress. She complains of increased headaches since October 2016, few times a week, has to use Imitrex injection sometimes repeat dosing a day, followed by Frova next day, does help her headaches,  Previously she has tried Topamax, complains of slow thinking, which does help her headaches   UPDATE Feb 04 2015: Last BOTOX injection was in June 2015, she complains of difficulty breathing, especially at night times, after BOTOX wearing off, she began to notice improvement. She had 6 imitrex injection in Jan 2017, which did help her headache.  She had slurred words, mild memory loss with topamax.  She still looks for a job.  UPDATE May 04 2015: She return for Botox injection as migraine prevention, she continue has frequent migraine headaches, require Imitrex injection,  UPDATE July 30 2015: She responded very well to previous Botox injection, she had 7 migraines over the past 3 months, she used Frova when she woke up with severe headache, Imitrex injection for moderate to severe headaches, both works well,  She has developed left thoracic shingle in June 2017, develop postherpetic neuralgia, has tried gabapentin 100 mg 2 tablets 3 times a day, which has made her drowsy, but did not help her neuropathic pain   REVIEW OF SYSTEMS: Full 14 system review of systems performed and notable only for activity change, appetite change, chill, fever, runny nose, cough, wheezing, chest tightness, nausea, frequent awakening, joint pain,  achy muscles, dizziness, headaches, depression  ALLERGIES: Allergies  Allergen Reactions  . Statins Other (See Comments)    Pt states she has severe joint and body aches.  . Codeine Hives    rash    HOME MEDICATIONS: Current Outpatient Prescriptions  Medication Sig Dispense Refill  . aspirin 325 MG tablet Take 325 mg by  mouth 2 (two) times daily.    Marland Kitchen aspirin-acetaminophen-caffeine (EXCEDRIN MIGRAINE) 250-250-65 MG tablet Take 2 tablets by mouth every 6 (six) hours as needed for headache or migraine.    . frovatriptan (FROVA) 2.5 MG tablet Take 1 tablet (2.5 mg total) by mouth as needed for migraine. If recurs, may repeat after 2 hours. Max of 3 tabs in 24 hours. 15 tablet 11  . ibuprofen (ADVIL,MOTRIN) 200 MG tablet Take 400 mg by mouth every 6 (six) hours as needed for mild pain.    . Magnesium 250 MG TABS Take 250 mg by mouth 2 (two) times daily.    . Melatonin 1 MG/ML LIQD Take 1 mg by mouth at bedtime. (Patient taking differently: Take 1-2 mg by mouth at bedtime as needed (sleep). ) 58 mL 11  . ranitidine (ZANTAC) 150 MG capsule Take 150 mg by mouth daily. Reported on 01/09/2015    . SUMAtriptan 6 MG/0.5ML SOAJ Inject 0.5 mLs into the skin as needed (migraine.  May repeat once in 2 hours if needed). 18 Syringe 4  . topiramate (TOPAMAX) 100 MG tablet Take 1 tablet (100 mg total) by mouth daily. 30 tablet 11  . venlafaxine XR (EFFEXOR-XR) 75 MG 24 hr capsule Take 1 capsule (75 mg total) by mouth daily. 90 capsule 3   No current facility-administered medications for this visit.    PAST MEDICAL HISTORY: Past Medical History  Diagnosis Date  . Allergy   . Migraine   . Chest tightness   . Hyperlipidemia   . Anxiety   . Depression   . Factor V deficiency (HCC)   . Blood clot in vein     right leg  . Varicose vein   . Morbid obesity with BMI of 40.0-44.9, adult (HCC)     PAST SURGICAL HISTORY: Past Surgical History  Procedure Laterality Date  . Tubal ligation  2006  . Vein ligation Bilateral     FAMILY HISTORY: Family History  Problem Relation Age of Onset  . Diabetes Mother 26    type 2  . Diabetes Father 41    type 2  . Dementia Father 39  . Hyperlipidemia Father   . Diabetes Sister   . Hyperlipidemia Sister   . Hypertension Sister   . Diabetes Brother   . Heart disease Brother   .  Hyperlipidemia Brother   . Hypertension Brother   . Breast cancer Sister     SOCIAL HISTORY:  Social History   Social History  . Marital Status: Divorced    Spouse Name: N/A  . Number of Children: 0  . Years of Education: 12   Occupational History  . 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 Smoking status: Never Smoker   . Smokeless tobacco: Never Used  . Alcohol Use: 0.6 oz/week    1 Glasses of wine per week     Comment: OCC  . Drug Use: No  . Sexual Activity: Not on file   Other Topics Concern  . Not on file   Social History Narrative   Patient is divorced. Patient has a high  school education and works for [employer].    Right handed.   Caffeine- two daily              PHYSICAL EXAM   Filed Vitals:   07/30/15 0759  BP: 123/83  Pulse: 73  Height: 5' 4.75" (1.645 [name])  Weight: 207 lb 12 oz (94.235 kg)    Not recorded      Body mass index is 34.82 kg/([name]^2).  PHYSICAL EXAMNIATION:  Gen: NAD, conversant, well nourised, obese, well groomed                     Cardiovascular: Regular rate rhythm, no peripheral edema, warm, nontender. Eyes: Conjunctivae clear without exudates or hemorrhage Neck: Supple, no carotid bruise. Pulmonary: Clear to auscultation bilaterally  Skin: Left thoracic shingle rash, dried vesicle, discoloration   NEUROLOGICAL EXAM:  MENTAL STATUS: Speech:    Speech is normal; fluent and spontaneous with normal comprehension.  Cognition:     Orientation to time, place and person     Normal recent and remote memory     Normal Attention span and concentration     Normal Language, naming, repeating,spontaneous speech     Fund of knowledge   CRANIAL NERVES: CN II: Visual fields are full to confrontation. Fundoscopic exam is normal with sharp discs and no vascular changes. Pupils are round equal and briskly reactive to light. CN III, IV, VI: extraocular movement are normal. No ptosis. CN V: Facial  sensation is intact to pinprick in all 3 divisions bilaterally. Corneal responses are intact.  CN VII: Face is symmetric with normal eye closure and smile. CN VIII: Hearing is normal to rubbing fingers CN IX, X: Palate elevates symmetrically. Phonation is normal. CN XI: Head turning and shoulder shrug are intact CN XII: Tongue is midline with normal movements and no atrophy.  MOTOR: There is no pronator drift of out-stretched arms. Muscle bulk and tone are normal. Muscle strength is normal.  REFLEXES: Reflexes are 2+ and symmetric at the biceps, triceps, knees, and ankles. Plantar responses are flexor.  SENSORY: Intact to light touch, pinprick, position sense, and vibration sense are intact in fingers and toes.  COORDINATION: Rapid alternating movements and fine finger movements are intact. There is no dysmetria on finger-to-nose and heel-knee-shin.    GAIT/STANCE: Posture is normal. Gait is steady with normal steps, base, arm swing, and turning. Heel and toe walking are normal. Tandem gait is normal.  Romberg is absent.   DIAGNOSTIC DATA (LABS, IMAGING, TESTING) - I reviewed patient records, labs, notes, testing and imaging myself where available.   ASSESSMENT AND PLAN  [name] is a 55 y.o. female   Worsening Chronic migraine  Topamax  100 mg titrating to 2 tablets every night as migraine prevention  Continue Imitrex injection, Frova  as needed  BOTOX injection was performed according to protocol by [facility]. 100 units of BOTOX was dissolved into 2 cc NS.    Total of  200   Frontalis 4 sites,  20 units, Temporalis 8 sites,  40 units  Occipitalis 6 sites, 30 units Cervical Paraspinal, 4 sites, 20 units Trapezius, 6 sites, 30 units  Extra 60  units was injected to left skull  30  Units, right skull right parietal region 10 Bilateral masseters,10 units each side.  Patient tolerate the injection well.  . Anxiety  keep Effexor XR 37.5 1-2 tabs po  qhs.  Postherpetic neuralgia  Add on Trileptal 150mg  bid   [name]  Terrace Arabia, [name].D. Ph.D.  [facility] [address] [place], [place] [address] Ph: [phone] Fax: [phone]  CC: Referring Provider

## 2015-07-30 NOTE — Progress Notes (Signed)
**  Botox 200 units x 1, Lot [id number], Exp 02/2018, [facility] [id number], specialty pharmacy.**mck,rn.

## 2015-10-23 ENCOUNTER — Telehealth: Payer: Self-pay | Admitting: Neurology

## 2015-10-23 NOTE — Telephone Encounter (Signed)
Patient lives out of town now and has a two hour drive. Patient want's to know if she can come in on Oct 20th after 12:00 . Please advise. Botox Injection.

## 2015-10-26 NOTE — Telephone Encounter (Signed)
Spoke to [facility] - she rescheduled her appt at 4:15pm on 10/28/15.

## 2015-10-26 NOTE — Telephone Encounter (Signed)
Noted Patient 's Patient's Botox is Here. Thanks Annabelle Harmanana.

## 2015-10-28 ENCOUNTER — Ambulatory Visit (INDEPENDENT_AMBULATORY_CARE_PROVIDER_SITE_OTHER): Payer: 59 | Admitting: Neurology

## 2015-10-28 ENCOUNTER — Encounter: Payer: Self-pay | Admitting: Neurology

## 2015-10-28 VITALS — BP 117/75 | HR 72 | Ht 64.75 in

## 2015-10-28 DIAGNOSIS — G43719 Chronic migraine without aura, intractable, without status migrainosus: Secondary | ICD-10-CM

## 2015-10-28 NOTE — Progress Notes (Signed)
**  Botox 200 units x 1 vial, Lot [id number], Exp 05/2018, [facility] [id number], specialty pharmacy//mck,rn.**

## 2015-10-28 NOTE — Progress Notes (Signed)
Chief Complaint  Patient presents with  . Migraine    Botox 200 units - specialty pharmacy      PATIENT: [name] [facility] DOB: Dec 13, 1960  Chief Complaint  Patient presents with  . Migraine    Botox 200 units - specialty pharmacy     HISTORICAL  [name] is a 55 year old Caucasian female, came in for Botox injection for chronic migraine headaches.  HISTORY: She has a history of chronic migraine headaches for a long time, increased since 2008, her migraine usually start at one side, occipital region, spreading forward, throbbing severe headache, with smell and light and noise sensitivity,  lasting 2-3 days  About half times in a month, she would have some degree of headaches, out of those headaches, about 2-5 are severe long-lasting headache, lasting about 2-3 days,  over the past few years, she has tried different preventive medications, Topamax, cause confusion, difficulty talking, propanolol, she could not tolerate, and doesn't help, antidepression-excessive fatigue, no help.  She was able to identify trigger of her migraine, stress, menstruation period of time, sometimes woke up with severe headache,  she is currently taking Frova and Imitrex tablets, which helped her majority of the time, this is her first Botox injection as migraine prevention  She responded very well to last Botox injection, she began to notice increased frequency of headaches since June 2014, about once a week, at the maximum benefit to Botox, she only has one to 2 headaches each month, Frova has always been able to take care of her headache, but she usually tried Imitrex tablets first, 20% of time she has to take second tablet by the afternoon, she previously responded to Imitrex subcutaneous injection very well, she worried about losing benefit of Frova with repeat use.  UPDATE June 24th 2015: She is doing well with Imitrex injection, she still has some recurrent headaches in past 3 months, responding  well with imitrex injections.  UPDATE Nov 19 2014: Last BOTOX injection was in June 2015.  She lost job in June 26 2014.  She is gong through a lot stress. She complains of increased headaches since October 2016, few times a week, has to use Imitrex injection sometimes repeat dosing a day, followed by Frova next day, does help her headaches,  Previously she has tried Topamax, complains of slow thinking, which does help her headaches   UPDATE Feb 04 2015: Last BOTOX injection was in June 2015, she complains of difficulty breathing, especially at night times, after BOTOX wearing off, she began to notice improvement. She had 6 imitrex injection in Jan 2017, which did help her headache.  She had slurred words, mild memory loss with topamax.  She still looks for a job.  UPDATE May 04 2015: She return for Botox injection as migraine prevention, she continue has frequent migraine headaches, require Imitrex injection,  UPDATE July 30 2015: She responded very well to previous Botox injection, she had 7 migraines over the past 3 months, she used Frova when she woke up with severe headache, Imitrex injection for moderate to severe headaches, both works well,  She has developed left thoracic shingle in June 2017, develop postherpetic neuralgia, has tried gabapentin 100 mg 2 tablets 3 times a day, which has made her drowsy, but did not help her neuropathic pain   Update October 28 2015:  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 Statins Other (See Comments)    Pt states she has severe joint and body aches.  . Codeine Hives    rash    HOME MEDICATIONS: Current  Outpatient Prescriptions  Medication Sig Dispense Refill  . aspirin 325 MG tablet Take 325 mg by mouth 2 (two) times daily.    Marland Kitchen aspirin-acetaminophen-caffeine (EXCEDRIN MIGRAINE) 250-250-65 MG tablet Take 2 tablets by mouth every 6 (six) hours as needed for headache or migraine.    . Botulinum Toxin Type A (BOTOX) 200 units SOLR Inject as directed every 3 (three) months.    . frovatriptan (FROVA) 2.5 MG tablet Take 1 tablet (2.5 mg total) by mouth as needed for migraine. If recurs, may repeat after 2 hours. Max of 3 tabs in 24 hours. 27 tablet 4  . ibuprofen (ADVIL,MOTRIN) 200 MG tablet Take 400 mg by mouth every 6 (six) hours as needed for mild pain.    . Magnesium 250 MG TABS Take 250 mg by mouth 2 (two) times daily.    . Melatonin 1 MG/ML LIQD Take 1 mg by mouth at bedtime. (Patient taking differently: Take 1-2 mg by mouth at bedtime as needed (sleep). ) 58 mL 11  . OXcarbazepine (TRILEPTAL) 150 MG tablet Take 1 tablet (150 mg total) by mouth 2 (two) times daily. 60 tablet 6  . ranitidine (ZANTAC) 150 MG capsule Take 150 mg by mouth daily. Reported on 01/09/2015    . SUMAtriptan 6 MG/0.5ML SOAJ Inject 0.5 mLs into the skin as needed (migraine.  May repeat once in 2 hours if needed). 18 Syringe 4  . topiramate (TOPAMAX) 100 MG tablet Take 1 tablet (100 mg total) by mouth daily. 30 tablet 11  . venlafaxine XR (EFFEXOR-XR) 75 MG 24 hr capsule Take 1 capsule (75 mg total) by mouth daily. 90 capsule 3   No current facility-administered medications for this visit.     PAST MEDICAL HISTORY: Past Medical History:  Diagnosis Date  . Allergy   . Anxiety   . Blood clot in vein    right leg  . Chest tightness   . Depression   . Factor V deficiency (HCC)   . Hyperlipidemia   . Migraine   . Morbid obesity with BMI of 40.0-44.9, adult (HCC)   . Varicose vein     PAST SURGICAL HISTORY: Past Surgical History:  Procedure Laterality Date  . TUBAL LIGATION  2006  . VEIN LIGATION Bilateral      FAMILY HISTORY: Family History  Problem Relation Age of Onset  . Diabetes Mother 34    type 2  . Diabetes Father 70    type 2  . Dementia Father 67  . Hyperlipidemia Father   . Diabetes Sister   . Hyperlipidemia Sister   . Hypertension Sister   . Diabetes Brother   . Heart disease Brother   . Hyperlipidemia Brother   . Hypertension Brother   . Breast cancer Sister     SOCIAL HISTORY:  Social History   Social History  . Marital status: Divorced    Spouse name: N/A  . Number of children: 0  . Years of education: 12   Occupational History  . 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  Smoking status: Never Smoker  . Smokeless tobacco: Never Used  . Alcohol use 0.6 oz/week    1 Glasses of wine per week     Comment: OCC  . Drug use: No  . Sexual activity: Not on file   Other Topics Concern  . Not on file   Social History Narrative   Patient is divorced. Patient has a high school education and works for [employer].    Right handed.   Caffeine- two daily              PHYSICAL EXAM   Vitals:   10/28/15 1613  BP: 117/75  Pulse: 72  Height: 5' 4.75" (1.645 m)    Not recorded      There is no height or weight on file to calculate BMI.  PHYSICAL EXAMNIATION:  Gen: NAD, conversant, well nourised, obese, well groomed                     Cardiovascular: Regular rate rhythm, no peripheral edema, warm, nontender. Eyes: Conjunctivae clear without exudates or hemorrhage Neck: Supple, no carotid bruise. Pulmonary: Clear to auscultation bilaterally  Skin: Left thoracic shingle rash, dried vesicle, discoloration   NEUROLOGICAL EXAM:  MENTAL STATUS: Speech:    Speech is normal; fluent and spontaneous with normal comprehension.  Cognition:     Orientation to time, place and person     Normal recent and remote memory     Normal Attention span and concentration     Normal Language, naming, repeating,spontaneous speech      Fund of knowledge   CRANIAL NERVES: CN II: Visual fields are full to confrontation. Fundoscopic exam is normal with sharp discs and no vascular changes. Pupils are round equal and briskly reactive to light. CN III, IV, VI: extraocular movement are normal. No ptosis. CN V: Facial sensation is intact to pinprick in all 3 divisions bilaterally. Corneal responses are intact.  CN VII: Face is symmetric with normal eye closure and smile. CN VIII: Hearing is normal to rubbing fingers CN IX, X: Palate elevates symmetrically. Phonation is normal. CN XI: Head turning and shoulder shrug are intact CN XII: Tongue is midline with normal movements and no atrophy.  MOTOR: There is no pronator drift of out-stretched arms. Muscle bulk and tone are normal. Muscle strength is normal.  REFLEXES: Reflexes are 2+ and symmetric at the biceps, triceps, knees, and ankles. Plantar responses are flexor.  SENSORY: Intact to light touch, pinprick, position sense, and vibration sense are intact in fingers and toes.  COORDINATION: Rapid alternating movements and fine finger movements are intact. There is no dysmetria on finger-to-nose and heel-knee-shin.    GAIT/STANCE: Posture is normal. Gait is steady with normal steps, base, arm swing, and turning. Heel and toe walking are normal. Tandem gait is normal.  Romberg is absent.   DIAGNOSTIC DATA (LABS, IMAGING, TESTING) - I reviewed patient records, labs, notes, testing and imaging myself where available.   ASSESSMENT AND PLAN  [name] is a 55 y.o. female   Worsening Chronic migraine  Topamax  100 mg titrating to 2 tablets every night as migraine prevention  Continue Imitrex injection, Frova  as needed  BOTOX injection was performed according to protocol by [facility]. 100 units of BOTOX was dissolved into 2 cc NS.    Total of  200   BOTOX injection was performed according to protocol by [facility]. 100 units of BOTOX was dissolved into 2 cc  NS.  He  used 200 units of BOTOX A  Corrugator 2 sites, 10 units Procerus 1 site, 5 unit Frontalis 4 sites,  20 units, Temporalis 8 sites,  40 units  Occipitalis 6 sites, 30 units Cervical Paraspinal, 4 sites, 20 units Trapezius, 6 sites, 30 units  Extra 45 units: Bilateral masseters 15 units each, 15 units into left parietal temporal frontal skull  Will return for repeat injection in 3 months.  Patient tolerate the injection well.  . Anxiety  keep Effexor XR 37.5 1-2 tabs po qhs.     Jodi FeinsteinYijun Natalie Leclaire, M.D. Ph.D.  [facility] [address], [address] [place], [place] [address] Ph: [phone] Fax: [phone]  CC: Referring Provider     Chief Complaint  Patient presents with  . Migraine    Botox 200 units - specialty pharmacy      [facility]: 1960/07/07  Chief Complaint  Patient presents with  . Migraine    Botox 200 units - specialty pharmacy     HISTORICAL  Jodi Shipperenny M Mellott is a 55 year old Caucasian female, came in for Botox injection for chronic migraine headaches.  HISTORY: She has a history of chronic migraine headaches for a long time, increased since 2008, her migraine usually start at one side, occipital region, spreading forward, throbbing severe headache, with smell and light and noise sensitivity,  lasting 2-3 days  About half times in a month, she would have some degree of headaches, out of those headaches, about 2-5 are severe long-lasting headache, lasting about 2-3 days,  over the past few years, she has tried different preventive medications, Topamax, cause confusion, difficulty talking, propanolol, she could not tolerate, and doesn't help, antidepression-excessive fatigue, no help.  She was able to identify trigger of her migraine, stress, menstruation period of time, sometimes woke up with severe headache,  she is currently taking Frova and Imitrex tablets, which helped her majority of the time, this is her  first Botox injection as migraine prevention  She responded very well to last Botox injection, she began to notice increased frequency of headaches since June 2014, about once a week, at the maximum benefit to Botox, she only has one to 2 headaches each month, Frova has always been able to take care of her headache, but she usually tried Imitrex tablets first, 20% of time she has to take second tablet by the afternoon, she previously responded to Imitrex subcutaneous injection very well, she worried about losing benefit of Frova with repeat use.  UPDATE June 24th 2015: She is doing well with Imitrex injection, she still has some recurrent headaches in past 3 months, responding well with imitrex injections.  UPDATE Nov 19 2014: Last BOTOX injection was in June 2015.  She lost job in June 26 2014.  She is gong through a lot stress. She complains of increased headaches since October 2016, few times a week, has to use Imitrex injection sometimes repeat dosing a day, followed by Frova next day, does help her headaches,  Previously she has tried Topamax, complains of slow thinking, which does help her headaches   UPDATE Feb 04 2015: Last BOTOX injection was in June 2015, she complains of difficulty breathing, especially at night times, after BOTOX wearing off, she began to notice improvement. She had 6 imitrex injection in Jan 2017, which did help her headache.  She had slurred words, mild memory loss with topamax.  She still looks for a job.  UPDATE May 04 2015: She return for Botox injection  as migraine prevention, she continue has frequent migraine headaches, require Imitrex injection,  UPDATE July 30 2015: She responded very well to previous Botox injection, she had 7 migraines over the past 3 months, she used Frova when she woke up with severe headache, Imitrex injection for moderate to severe headaches, both works well,  She has developed left thoracic shingle in June 2017, develop postherpetic  neuralgia, has tried gabapentin 100 mg 2 tablets 3 times a day, which has made her drowsy, but did not help her neuropathic pain   REVIEW OF SYSTEMS: Full 14 system review of systems performed and notable only for activity change, appetite change, chill, fever, runny nose, cough, wheezing, chest tightness, nausea, frequent awakening, joint pain, achy muscles, dizziness, headaches, depression  ALLERGIES: Allergies  Allergen Reactions  . Statins Other (See Comments)    Pt states she has severe joint and body aches.  . Codeine Hives    rash    HOME MEDICATIONS: Current Outpatient Prescriptions  Medication Sig Dispense Refill  . aspirin 325 MG tablet Take 325 mg by mouth 2 (two) times daily.    Marland Kitchen aspirin-acetaminophen-caffeine (EXCEDRIN MIGRAINE) 250-250-65 MG tablet Take 2 tablets by mouth every 6 (six) hours as needed for headache or migraine.    . Botulinum Toxin Type A (BOTOX) 200 units SOLR Inject as directed every 3 (three) months.    . frovatriptan (FROVA) 2.5 MG tablet Take 1 tablet (2.5 mg total) by mouth as needed for migraine. If recurs, may repeat after 2 hours. Max of 3 tabs in 24 hours. 27 tablet 4  . ibuprofen (ADVIL,MOTRIN) 200 MG tablet Take 400 mg by mouth every 6 (six) hours as needed for mild pain.    . Magnesium 250 MG TABS Take 250 mg by mouth 2 (two) times daily.    . Melatonin 1 MG/ML LIQD Take 1 mg by mouth at bedtime. (Patient taking differently: Take 1-2 mg by mouth at bedtime as needed (sleep). ) 58 mL 11  . OXcarbazepine (TRILEPTAL) 150 MG tablet Take 1 tablet (150 mg total) by mouth 2 (two) times daily. 60 tablet 6  . ranitidine (ZANTAC) 150 MG capsule Take 150 mg by mouth daily. Reported on 01/09/2015    . SUMAtriptan 6 MG/0.5ML SOAJ Inject 0.5 mLs into the skin as needed (migraine.  May repeat once in 2 hours if needed). 18 Syringe 4  . topiramate (TOPAMAX) 100 MG tablet Take 1 tablet (100 mg total) by mouth daily. 30 tablet 11  . venlafaxine XR (EFFEXOR-XR) 75  MG 24 hr capsule Take 1 capsule (75 mg total) by mouth daily. 90 capsule 3   No current facility-administered medications for this visit.     PAST MEDICAL HISTORY: Past Medical History:  Diagnosis Date  . Allergy   . Anxiety   . Blood clot in vein    right leg  . Chest tightness   . Depression   . Factor V deficiency (HCC)   . Hyperlipidemia   . Migraine   . Morbid obesity with BMI of 40.0-44.9, adult (HCC)   . Varicose vein     PAST SURGICAL HISTORY: Past Surgical History:  Procedure Laterality Date  . TUBAL LIGATION  2006  . VEIN LIGATION Bilateral     FAMILY HISTORY: Family History  Problem Relation Age of Onset  . Diabetes Mother 48    type 2  . Diabetes Father 64    type 2  . Dementia Father 41  . Hyperlipidemia Father   .  Diabetes Sister   . Hyperlipidemia Sister   . Hypertension Sister   . Diabetes Brother   . Heart disease Brother   . Hyperlipidemia Brother   . Hypertension Brother   . Breast cancer Sister     SOCIAL HISTORY:  Social History   Social History  . Marital status: Divorced    Spouse name: N/A  . Number of children: 0  . Years of education: 12   Occupational History  . 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 Smoking status: Never Smoker  . Smokeless tobacco: Never Used  . Alcohol use 0.6 oz/week    1 Glasses of wine per week     Comment: OCC  . Drug use: No  . Sexual activity: Not on file   Other Topics Concern  . Not on file   Social History Narrative   Patient is divorced. Patient has a high school education and works for [employer].    Right handed.   Caffeine- two daily              PHYSICAL EXAM   Vitals:   10/28/15 1613  BP: 117/75  Pulse: 72  Height: 5' 4.75" (1.645 m)    Not recorded      There is no height or weight on file to calculate BMI.  PHYSICAL EXAMNIATION:  Gen: NAD, conversant, well nourised, obese, well groomed                       Cardiovascular: Regular rate rhythm, no peripheral edema, warm, nontender. Eyes: Conjunctivae clear without exudates or hemorrhage Neck: Supple, no carotid bruise. Pulmonary: Clear to auscultation bilaterally  Skin: Left thoracic shingle rash, dried vesicle, discoloration   NEUROLOGICAL EXAM:  MENTAL STATUS: Speech:    Speech is normal; fluent and spontaneous with normal comprehension.  Cognition:     Orientation to time, place and person     Normal recent and remote memory     Normal Attention span and concentration     Normal Language, naming, repeating,spontaneous speech     Fund of knowledge   CRANIAL NERVES: CN II: Visual fields are full to confrontation. Fundoscopic exam is normal with sharp discs and no vascular changes. Pupils are round equal and briskly reactive to light. CN III, IV, VI: extraocular movement are normal. No ptosis. CN V: Facial sensation is intact to pinprick in all 3 divisions bilaterally. Corneal responses are intact.  CN VII: Face is symmetric with normal eye closure and smile. CN VIII: Hearing is normal to rubbing fingers CN IX, X: Palate elevates symmetrically. Phonation is normal. CN XI: Head turning and shoulder shrug are intact CN XII: Tongue is midline with normal movements and no atrophy.  MOTOR: There is no pronator drift of out-stretched arms. Muscle bulk and tone are normal. Muscle strength is normal.  REFLEXES: Reflexes are 2+ and symmetric at the biceps, triceps, knees, and ankles. Plantar responses are flexor.  SENSORY: Intact to light touch, pinprick, position sense, and vibration sense are intact in fingers and toes.  COORDINATION: Rapid alternating movements and fine finger movements are intact. There is no dysmetria on finger-to-nose and heel-knee-shin.    GAIT/STANCE: Posture is normal. Gait is steady with normal steps, base, arm swing, and turning. Heel and toe walking are normal. Tandem gait is normal.  Romberg is  absent.   DIAGNOSTIC DATA (LABS, IMAGING, TESTING) - I reviewed patient records, labs, notes, testing  and imaging myself where available.   ASSESSMENT AND PLAN  [name] is a 55 y.o. female   Worsening Chronic migraine  Topamax  100 mg titrating to 2 tablets every night as migraine prevention  Continue Imitrex injection, Frova  as needed  BOTOX injection was performed according to protocol by [facility]. 100 units of BOTOX was dissolved into 2 cc NS.    Total of  200   Frontalis 4 sites,  20 units, Temporalis 8 sites,  40 units  Occipitalis 6 sites, 30 units Cervical Paraspinal, 4 sites, 20 units Trapezius, 6 sites, 30 units  Extra 60  units was injected to left skull  30  Units, right skull right parietal region 10 Bilateral masseters,10 units each side.  Patient tolerate the injection well.  . Anxiety  keep Effexor XR 37.5 1-2 tabs po qhs.  Postherpetic neuralgia  Add on Trileptal 150mg  bid   [name], M.D. Ph.D.  [facility] [address] [place], [place] [address] Ph: [phone] Fax: [phone]  CC: Referring Provider     Chief Complaint  Patient presents with  . Migraine    Botox 200 units - specialty pharmacy      PATIENT: Kasarah Sitts [facility] DOB: Apr 05, 1960  Chief Complaint  Patient presents with  . Migraine    Botox 200 units - specialty pharmacy     HISTORICAL  [name] is a 55 year old Caucasian female, came in for Botox injection for chronic migraine headaches.  HISTORY: She has a history of chronic migraine headaches for a long time, increased since 2008, her migraine usually start at one side, occipital region, spreading forward, throbbing severe headache, with smell and light and noise sensitivity,  lasting 2-3 days  About half times in a month, she would have some degree of headaches, out of those headaches, about 2-5 are severe long-lasting headache, lasting about 2-3 days,  over the past  few years, she has tried different preventive medications, Topamax, cause confusion, difficulty talking, propanolol, she could not tolerate, and doesn't help, antidepression-excessive fatigue, no help.  She was able to identify trigger of her migraine, stress, menstruation period of time, sometimes woke up with severe headache,  she is currently taking Frova and Imitrex tablets, which helped her majority of the time, this is her first Botox injection as migraine prevention  She responded very well to last Botox injection, she began to notice increased frequency of headaches since June 2014, about once a week, at the maximum benefit to Botox, she only has one to 2 headaches each month, Frova has always been able to take care of her headache, but she usually tried Imitrex tablets first, 20% of time she has to take second tablet by the afternoon, she previously responded to Imitrex subcutaneous injection very well, she worried about losing benefit of Frova with repeat use.  UPDATE June 24th 2015: She is doing well with Imitrex injection, she still has some recurrent headaches in past 3 months, responding well with imitrex injections.  UPDATE Nov 19 2014: Last BOTOX injection was in June 2015.  She lost job in June 26 2014.  She is gong through a lot stress. She complains of increased headaches since October 2016, few times a week, has to use Imitrex injection sometimes repeat dosing a day, followed by Frova next day, does help her headaches,  Previously she has tried Topamax, complains of slow thinking, which does help her headaches   UPDATE Feb 04 2015: Last  BOTOX injection was in June 2015, she complains of difficulty breathing, especially at night times, after BOTOX wearing off, she began to notice improvement. She had 6 imitrex injection in Jan 2017, which did help her headache.  She had slurred words, mild memory loss with topamax.  She still looks for a job.  UPDATE May 04 2015: She return  for Botox injection as migraine prevention, she continue has frequent migraine headaches, require Imitrex injection,  UPDATE July 30 2015: She responded very well to previous Botox injection, she had 7 migraines over the past 3 months, she used Frova when she woke up with severe headache, Imitrex injection for moderate to severe headaches, both works well,  She has developed left thoracic shingle in June 2017, develop postherpetic neuralgia, has tried gabapentin 100 mg 2 tablets 3 times a day, which has made her drowsy, but did not help her neuropathic pain   UPDATE Oct 28 2015: She has been doing very well, had 11 migraine headaches over past 3 months, she is using Frova as needed, sumatriptan injection as needed,   REVIEW OF SYSTEMS: Full 14 system review of systems performed and notable only for activity change, appetite change, chill, fever, runny nose, cough, wheezing, chest tightness, nausea, frequent awakening, joint pain, achy muscles, dizziness, headaches, depression  ALLERGIES: Allergies  Allergen Reactions  . Statins Other (See Comments)    Pt states she has severe joint and body aches.  . Codeine Hives    rash    HOME MEDICATIONS: Current Outpatient Prescriptions  Medication Sig Dispense Refill  . aspirin 325 MG tablet Take 325 mg by mouth 2 (two) times daily.    Marland Kitchen aspirin-acetaminophen-caffeine (EXCEDRIN MIGRAINE) 250-250-65 MG tablet Take 2 tablets by mouth every 6 (six) hours as needed for headache or migraine.    . Botulinum Toxin Type A (BOTOX) 200 units SOLR Inject as directed every 3 (three) months.    . frovatriptan (FROVA) 2.5 MG tablet Take 1 tablet (2.5 mg total) by mouth as needed for migraine. If recurs, may repeat after 2 hours. Max of 3 tabs in 24 hours. 27 tablet 4  . ibuprofen (ADVIL,MOTRIN) 200 MG tablet Take 400 mg by mouth every 6 (six) hours as needed for mild pain.    . Magnesium 250 MG TABS Take 250 mg by mouth 2 (two) times daily.    . Melatonin 1  MG/ML LIQD Take 1 mg by mouth at bedtime. (Patient taking differently: Take 1-2 mg by mouth at bedtime as needed (sleep). ) 58 mL 11  . OXcarbazepine (TRILEPTAL) 150 MG tablet Take 1 tablet (150 mg total) by mouth 2 (two) times daily. 60 tablet 6  . ranitidine (ZANTAC) 150 MG capsule Take 150 mg by mouth daily. Reported on 01/09/2015    . SUMAtriptan 6 MG/0.5ML SOAJ Inject 0.5 mLs into the skin as needed (migraine.  May repeat once in 2 hours if needed). 18 Syringe 4  . topiramate (TOPAMAX) 100 MG tablet Take 1 tablet (100 mg total) by mouth daily. 30 tablet 11  . venlafaxine XR (EFFEXOR-XR) 75 MG 24 hr capsule Take 1 capsule (75 mg total) by mouth daily. 90 capsule 3   No current facility-administered medications for this visit.     PAST MEDICAL HISTORY: Past Medical History:  Diagnosis Date  . Allergy   . Anxiety   . Blood clot in vein    right leg  . Chest tightness   . Depression   . Factor V deficiency (HCC)   .  Hyperlipidemia   . Migraine   . Morbid obesity with BMI of 40.0-44.9, adult (HCC)   . Varicose vein     PAST SURGICAL HISTORY: Past Surgical History:  Procedure Laterality Date  . TUBAL LIGATION  2006  . VEIN LIGATION Bilateral     FAMILY HISTORY: Family History  Problem Relation Age of Onset  . Diabetes Mother 45    type 2  . Diabetes Father 86    type 2  . Dementia Father 21  . Hyperlipidemia Father   . Diabetes Sister   . Hyperlipidemia Sister   . Hypertension Sister   . Diabetes Brother   . Heart disease Brother   . Hyperlipidemia Brother   . Hypertension Brother   . Breast cancer Sister     SOCIAL HISTORY:  Social History   Social History  . Marital status: Divorced    Spouse name: N/A  . Number of children: 0  . Years of education: 12   Occupational History  . 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 Smoking status: Never Smoker  . Smokeless tobacco: Never Used  . Alcohol use 0.6 oz/week    1  Glasses of wine per week     Comment: OCC  . Drug use: No  . Sexual activity: Not on file   Other Topics Concern  . Not on file   Social History Narrative   Patient is divorced. Patient has a high school education and works for [employer].    Right handed.   Caffeine- two daily              PHYSICAL EXAM   Vitals:   10/28/15 1613  BP: 117/75  Pulse: 72  Height: 5' 4.75" (1.645 m)    Not recorded      There is no height or weight on file to calculate BMI.  PHYSICAL EXAMNIATION:  Gen: NAD, conversant, well nourised, obese, well groomed                     Cardiovascular: Regular rate rhythm, no peripheral edema, warm, nontender. Eyes: Conjunctivae clear without exudates or hemorrhage Neck: Supple, no carotid bruise. Pulmonary: Clear to auscultation bilaterally  Skin: Left thoracic shingle rash, dried vesicle, discoloration   NEUROLOGICAL EXAM:  MENTAL STATUS: Speech:    Speech is normal; fluent and spontaneous with normal comprehension.  Cognition:     Orientation to time, place and person     Normal recent and remote memory     Normal Attention span and concentration     Normal Language, naming, repeating,spontaneous speech     Fund of knowledge   CRANIAL NERVES: CN II: Visual fields are full to confrontation. Fundoscopic exam is normal with sharp discs and no vascular changes. Pupils are round equal and briskly reactive to light. CN III, IV, VI: extraocular movement are normal. No ptosis. CN V: Facial sensation is intact to pinprick in all 3 divisions bilaterally. Corneal responses are intact.  CN VII: Face is symmetric with normal eye closure and smile. CN VIII: Hearing is normal to rubbing fingers CN IX, X: Palate elevates symmetrically. Phonation is normal. CN XI: Head turning and shoulder shrug are intact CN XII: Tongue is midline with normal movements and no atrophy.  MOTOR: There is no pronator drift of out-stretched arms. Muscle bulk and tone  are normal. Muscle strength is normal.  REFLEXES: Reflexes are 2+ and symmetric at the biceps, triceps,  knees, and ankles. Plantar responses are flexor.  SENSORY: Intact to light touch, pinprick, position sense, and vibration sense are intact in fingers and toes.  COORDINATION: Rapid alternating movements and fine finger movements are intact. There is no dysmetria on finger-to-nose and heel-knee-shin.    GAIT/STANCE: Posture is normal. Gait is steady with normal steps, base, arm swing, and turning. Heel and toe walking are normal. Tandem gait is normal.  Romberg is absent.   DIAGNOSTIC DATA (LABS, IMAGING, TESTING) - I reviewed patient records, labs, notes, testing and imaging myself where available.   ASSESSMENT AND PLAN  SHAYLIN BLATT is a 55 y.o. female   Worsening Chronic migraine  Topamax  100 mg titrating to 2 tablets every night as migraine prevention  Continue Imitrex injection, Frova  as needed  BOTOX injection was performed according to protocol by [facility]. 100 units of BOTOX was dissolved into 2 cc NS.    Total of  200   Frontalis 4 sites,  20 units, Temporalis 8 sites,  40 units  Occipitalis 6 sites, 30 units Cervical Paraspinal, 4 sites, 20 units Trapezius, 6 sites, 30 units  Extra 60  units was injected to left skull  30  Units, right skull right parietal region 10 Bilateral masseters,10 units each side.  Patient tolerate the injection well.  . Anxiety  keep Effexor XR 37.5 1-2 tabs po qhs.  Postherpetic neuralgia  Add on Trileptal 150mg  bid   [name], M.D. Ph.D.  [facility] [address], [address] [place], [place] [address] Ph: [phone] Fax: [phone]  CC: Referring Provider

## 2015-10-29 ENCOUNTER — Ambulatory Visit: Payer: 59 | Admitting: Neurology

## 2015-11-11 DIAGNOSIS — Z0289 Encounter for other administrative examinations: Secondary | ICD-10-CM

## 2016-03-17 ENCOUNTER — Other Ambulatory Visit: Payer: Self-pay | Admitting: Neurology

## 2016-03-18 ENCOUNTER — Telehealth: Payer: Self-pay | Admitting: *Deleted

## 2016-03-18 NOTE — Telephone Encounter (Signed)
Spoke to patient - she is taking Topamax 100mg , one tab qhs and doing well on this dosage.  Currently, she has medication at home and will let us know when she is ready for a refill.  She will also let us know which pharmacy she will need to use with her new insurance.

## 2016-03-18 NOTE — Telephone Encounter (Signed)
Left message for a return call.  I need to verify her Topamax dosage.  Received a refill request from pharmacy ([facility]) for Topamax 100mg , 1 tablet at bedtime.  Last office note states to titrate dose up to Topamax 100mg , 2 tablets at bedtime.

## 2016-03-18 NOTE — Telephone Encounter (Signed)
Left message for a return call.  I need to verify her Topamax dosage.  Received a refill request from pharmacy for Topamax 100mg , 1 tablet at bedtime.  Last office note states to titrate dose up to Topamax 100mg , 2 tablets at bedtime.

## 2016-05-19 ENCOUNTER — Telehealth: Payer: Self-pay | Admitting: Neurology

## 2016-05-19 NOTE — Telephone Encounter (Signed)
Patient called office in reference to filling venlafaxine XR (EFFEXOR-XR) 75 MG 24 hr capsule, topiramate (TOPAMAX) 100 MG tablet.  Patient would like to know if she can have the Effexor decreased.  Pharmacy- [facility] [place] 

## 2016-05-20 ENCOUNTER — Other Ambulatory Visit: Payer: Self-pay | Admitting: *Deleted

## 2016-05-20 MED ORDER — VENLAFAXINE HCL ER 37.5 MG PO CP24: 37.5000 mg | ORAL_CAPSULE | Freq: Every day | ORAL | 3 refills | Status: DC

## 2016-05-20 MED ORDER — TOPIRAMATE 100 MG PO TABS: 100.0000 mg | ORAL_TABLET | Freq: Every day | ORAL | 3 refills | Status: DC

## 2016-05-20 NOTE — Telephone Encounter (Signed)
Per vo by Dr. Terrace ArabiaYan, it is ok to decrease venlafaxine ER from 75mg  to 37.5mg  daily.  Spoke to patient - she is agreeable to this dosage.  She will continue her topiramate at 100mg  daily.  She does not have insurance right now so these prescriptions have been sent to [facility].  She will print a goodrx coupon that will make the cost affordable.

## 2016-10-17 ENCOUNTER — Telehealth: Payer: Self-pay | Admitting: Neurology

## 2016-10-17 MED ORDER — SUMATRIPTAN SUCCINATE 100 MG PO TABS: ORAL_TABLET | ORAL | 3 refills | Status: DC

## 2016-10-17 NOTE — Telephone Encounter (Signed)
She is unable to afford sumatriptan injections, even with [email or web address] coupon.  Per vo by Dr. Terrace Arabia, provide rx for sumatriptan  tablets.  Pt agreeable to change.  She is able to afford the cost of tablets.  New rx sent to [facility].

## 2016-10-17 NOTE — Addendum Note (Signed)
Addended by: Lindell Spar C on: 10/17/2016 06:10 PM   Modules accepted: Orders

## 2016-10-17 NOTE — Telephone Encounter (Signed)
Patient called office in reference to SUMAtriptan 6 MG/0.5ML SOAJ. Patient states she no longer has insurance and would like to know where she is able to get the injections from now.  Please call

## 2017-07-15 ENCOUNTER — Ambulatory Visit: Payer: BLUE CROSS/BLUE SHIELD | Admitting: Family Medicine

## 2017-07-15 ENCOUNTER — Ambulatory Visit (HOSPITAL_COMMUNITY)
Admission: RE | Admit: 2017-07-15 | Discharge: 2017-07-15 | Disposition: A | Discharge status: Home / Self Care | Payer: BLUE CROSS/BLUE SHIELD | Source: Ambulatory Visit | Attending: Family Medicine | Admitting: Family Medicine

## 2017-07-15 ENCOUNTER — Encounter: Payer: Self-pay | Admitting: Family Medicine

## 2017-07-15 ENCOUNTER — Emergency Department (HOSPITAL_COMMUNITY)
Admission: EM | Admit: 2017-07-15 | Discharge: 2017-07-15 | Disposition: A | Discharge status: Home / Self Care | Payer: Self-pay

## 2017-07-15 VITALS — BP 128/90 | HR 84 | Temp 98.5°F | Resp 16 | Ht 64.0 in | Wt 222.0 lb

## 2017-07-15 DIAGNOSIS — M7989 Other specified soft tissue disorders: Secondary | ICD-10-CM

## 2017-07-15 DIAGNOSIS — Z86718 Personal history of other venous thrombosis and embolism: Secondary | ICD-10-CM | POA: Diagnosis not present

## 2017-07-15 DIAGNOSIS — M79661 Pain in right lower leg: Secondary | ICD-10-CM

## 2017-07-15 NOTE — Progress Notes (Signed)
Subjective:    Patient ID: [name], female    DOB: 1960-05-21, 57 y.o.   MRN: [id number]  HPI [name] is a 57 y.o. female Presents today for: Chief Complaint  Patient presents with  . Leg Problem    possible swelling or clot    Hx of chronic venous insufficiency, prior DVT with factor V Leiden.  She has been treated in the past with anticoagulation, including Xarelto after gastric bypass due to history of DVT. Currently on aspirin 325mg  daily due to history of factor V leiden. Not on oral anticoagulation for past few years.   Sits at work usually, Administrator, [employer]. Does try to walk at times.  Does have some new pain in R calf noted 2 days ago. Did have R calf swelling yesterday and today. No redness, no chest pain, dyspnea or fever. No recent travel or surgery, but drove to [place] (3 and a half hours) few weeks. Stopped once to walk, but tries to exercise legs when seated.   Later in history did note that has chronic leg swelling - both legs, and prior DVT was on the left. Denies left calf pain or new swelling.      Patient Active Problem List   Diagnosis Date Noted  . Neuralgia 07/30/2015  . Factor 5 Leiden mutation, heterozygous (HCC) 07/17/2014  . Diabetes (HCC) 07/17/2014  . Vitamin D deficiency 07/17/2014  . 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 Chronic venous insufficiency 05/23/2013  . Migraine, chronic, without aura, intractable 09/13/2012  . Atypical chest pain 08/24/2011  . Hypercholesterolemia 08/24/2011  . Family history of premature coronary artery disease 08/24/2011   Past Medical History:  Diagnosis Date  . Allergy   . Anxiety   . Blood clot in vein    right leg  . Chest tightness   . Depression   . Factor V deficiency (HCC)   . Hyperlipidemia   . Migraine   . Morbid obesity with BMI of 40.0-44.9, adult (HCC)   . Varicose vein    Past Surgical History:  Procedure Laterality Date  . TUBAL LIGATION  2006  . VEIN LIGATION Bilateral     Allergies  Allergen Reactions  . Statins Other (See Comments)    Pt states she has severe joint and body aches.  . Codeine Hives    rash   Prior to Admission medications   Medication Sig Start Date End Date Taking? Authorizing Provider  aspirin 325 MG tablet Take 325 mg by mouth 2 (two) times daily.   Yes [provider]  aspirin-acetaminophen-caffeine (EXCEDRIN MIGRAINE) [phone] MG tablet Take 2 tablets by mouth every 6 (six) hours as needed for headache or migraine.   Yes [provider]  baclofen (LIORESAL) 10 MG tablet TK 1 T PO TID 05/06/17  Yes [provider]  frovatriptan (FROVA) 2.5 MG tablet Take 1 tablet (2.5 mg total) by mouth as needed for migraine. If recurs, may repeat after 2 hours. Max of 3 tabs in 24 hours. 07/30/15  Yes Levert Feinstein, MD  ibuprofen (ADVIL,MOTRIN) 200 MG tablet Take 400 mg by mouth every 6 (six) hours as needed for mild pain.   Yes [provider]  Magnesium 250 MG TABS Take 250 mg by mouth 2 (two) times daily.   Yes [provider]  nabumetone (RELAFEN) 750 MG tablet TK 1 T PO BID 05/06/17  Yes [provider]  ranitidine (ZANTAC) 150 MG capsule Take 150 mg by mouth daily. Reported on  01/09/2015   Yes [provider]  SUMAtriptan (IMITREX) 100 MG tablet Take 1 tab at onset of migraine.  May repeat in 2 hrs, if needed.  Max dose: 2 tabs/day. This is a 90 day prescription. 10/17/16  Yes Levert Feinstein, MD   Social History   Socioeconomic History  . Marital status: Divorced    Spouse name: Not on file  . Number of children: 0  . Years of education: 66  . Highest education level: Not on file  Occupational History  . Occupation: Engineering geologist: [employer]  Social Needs  . Financial resource strain: Not on file  . Food insecurity:    Worry: Not on file    Inability: Not on file  . Transportation needs:    Medical: Not on file    Non-medical: Not on file  Tobacco  Use  . Smoking status: Never Smoker  . Smokeless tobacco: Never Used  Substance and Sexual Activity  . Alcohol use: Yes    Alcohol/week: 0.6 oz    Types: 1 Glasses of wine per week    Comment: OCC  . Drug use: No  . Sexual activity: Not on file  Lifestyle  . Physical activity:    Days per week: Not on file    Minutes per session: Not on file  . Stress: Not on file  Relationships  . Social connections:    Talks on phone: Not on file    Gets together: Not on file    Attends religious service: Not on file    Active member of club or organization: Not on file    Attends meetings of clubs or organizations: Not on file    Relationship status: Not on file  . Intimate partner violence:    Fear of current or ex partner: Not on file    Emotionally abused: Not on file    Physically abused: Not on file    Forced sexual activity: Not on file  Other Topics Concern  . Not on file  Social History Narrative   Patient is divorced. Patient has a high school education and works for [employer].    Right handed.   Caffeine- two daily             Review of Systems  Constitutional: Negative for fever.  Respiratory: Negative for cough, chest tightness and shortness of breath.   Cardiovascular: Positive for leg swelling. Negative for chest pain.  Musculoskeletal: Positive for arthralgias and myalgias.  Skin: Negative for color change and wound.         Objective:   Physical Exam  Constitutional: She is oriented to person, place, and time. She appears well-developed and well-nourished.  HENT:  Head: Normocephalic and atraumatic.  Eyes: Pupils are equal, round, and reactive to light. Conjunctivae and EOM are normal.  Neck: Carotid bruit is not present.  Cardiovascular: Normal rate, regular rhythm, normal heart sounds and intact distal pulses.  Pulmonary/Chest: Effort normal and breath sounds normal.  Abdominal: Soft. She exhibits no pulsatile midline mass. There is no tenderness.   Musculoskeletal: She exhibits edema (43cm circumference at 15cm below patella on right vs 42cm on left. ) and tenderness (ttp medial upper calf. ).  Neurological: She is alert and oriented to person, place, and time.  Skin: Skin is warm and dry.  Psychiatric: She has a normal mood and affect. Her behavior is normal.  Vitals reviewed.  NVI distally  Vitals:   07/15/17  1232  BP: 128/90  Pulse: 84  Resp: 16  Temp: 98.5 F (36.9 C)  TempSrc: Oral  SpO2: 97%  Weight: 222 lb (100.7 kg)  Height: 5\' 4"  (1.626 m)      Assessment & Plan:  [name] is a 57 y.o. female Right calf pain - Plan: VAS US LOWER EXTREMITY VENOUS (DVT)  Calf swelling - Plan: VAS US LOWER EXTREMITY VENOUS (DVT)  History of DVT (deep vein thrombosis) - Plan: VAS US LOWER EXTREMITY VENOUS (DVT)  Initial concern for possible DVT, ultrasound ordered - negative for DVT and pt advised by staff. Likely related to chronic venous insufficiency.   No orders of the defined types were placed in this encounter.  Patient Instructions     IF you received an x-ray today, you will receive an invoice from [facility]. Please contact [facility] at [phone] with questions or concerns regarding your invoice.   IF you received labwork today, you will receive an invoice from [facility]. Please contact [facility] at [phone] with questions or concerns regarding your invoice.   Our billing staff will not be able to assist you with questions regarding bills from these companies.  You will be contacted with the lab results as soon as they are available. The fastest way to get your results is to activate your My Chart account. Instructions are located on the last page of this paperwork. If you have not heard from us regarding the results in 2 weeks, please contact this office.     Signed,   Meredith StaggersJeffrey Charlean Carneal, MD Primary Care at [facility].  07/15/17 1:53 PM

## 2017-07-15 NOTE — ED Notes (Signed)
Patient arrived in triage and this RN noted patient to have an outpatient order for US.  Patient taken back to registration, and this RN told to d/c patient.

## 2017-07-15 NOTE — Progress Notes (Signed)
VASCULAR LAB PRELIMINARY  PRELIMINARY  PRELIMINARY  PRELIMINARY  Right lower extremity venous duplex completed.    Preliminary report:  There is no DVT or SVT noted in the right lower extremity.   [facility] to [facility]  Floride Hutmacher, [facility], RVT 07/15/2017, 3:28 PM

## 2017-07-15 NOTE — Patient Instructions (Addendum)
   IF you received an x-ray today, you will receive an invoice from [facility]. Please contact [facility] at [phone] with questions or concerns regarding your invoice.   IF you received labwork today, you will receive an invoice from [facility]. Please contact [facility] at [phone] with questions or concerns regarding your invoice.   Our billing staff will not be able to assist you with questions regarding bills from these companies.  You will be contacted with the lab results as soon as they are available. The fastest way to get your results is to activate your My Chart account. Instructions are located on the last page of this paperwork. If you have not heard from us regarding the results in 2 weeks, please contact this office.    Pt [facility]  TODAY @ 2:30PM  [facility] TELL THEM YOU ARE THERE FOR AN OUT PATIENT VENOUS DOPPLER STUDY

## 2017-09-19 ENCOUNTER — Other Ambulatory Visit: Payer: Self-pay | Admitting: Neurology

## 2017-09-26 ENCOUNTER — Other Ambulatory Visit: Payer: Self-pay | Admitting: Neurology

## 2017-10-06 ENCOUNTER — Telehealth: Payer: Self-pay | Admitting: Neurology

## 2017-10-06 MED ORDER — VENLAFAXINE HCL ER 37.5 MG PO CP24: 37.5000 mg | ORAL_CAPSULE | Freq: Every day | ORAL | 0 refills | Status: DC

## 2017-10-06 NOTE — Telephone Encounter (Signed)
Per vo by Dr. Terrace ArabiaYan, okay to provide refill of venlafaxine XR 37.5mg , one tablet daily. The patient is aware it has been sent and will keep her follow up on 10/19/17.

## 2017-10-06 NOTE — Telephone Encounter (Signed)
Pt request refill for venlafaxine. Pt did stop taking this medication May 2018 and has restarted it June 2019. Pt now is working and has insurance. She has appt on 10/3 with Dr Terrace ArabiaYan if she could get enough to last until then. Pt is in the process of looking for PCP.

## 2017-10-19 ENCOUNTER — Encounter: Payer: Self-pay | Admitting: Neurology

## 2017-10-19 ENCOUNTER — Ambulatory Visit: Payer: BLUE CROSS/BLUE SHIELD | Admitting: Neurology

## 2017-10-19 ENCOUNTER — Telehealth: Payer: Self-pay | Admitting: Neurology

## 2017-10-19 ENCOUNTER — Other Ambulatory Visit: Payer: Self-pay

## 2017-10-19 VITALS — BP 146/94 | HR 76 | Resp 18 | Ht 64.0 in | Wt 228.0 lb

## 2017-10-19 DIAGNOSIS — G43709 Chronic migraine without aura, not intractable, without status migrainosus: Secondary | ICD-10-CM | POA: Diagnosis not present

## 2017-10-19 DIAGNOSIS — IMO0002 Reserved for concepts with insufficient information to code with codable children: Secondary | ICD-10-CM

## 2017-10-19 DIAGNOSIS — G4730 Sleep apnea, unspecified: Secondary | ICD-10-CM

## 2017-10-19 HISTORY — DX: Sleep apnea, unspecified: G47.30

## 2017-10-19 HISTORY — DX: Reserved for concepts with insufficient information to code with codable children: IMO0002

## 2017-10-19 MED ORDER — SUMATRIPTAN SUCCINATE 100 MG PO TABS
ORAL_TABLET | ORAL | 4 refills | Status: DC
Start: 2017-10-19 — End: 2018-10-25

## 2017-10-19 MED ORDER — VENLAFAXINE HCL ER 75 MG PO CP24: 75.0000 mg | ORAL_CAPSULE | Freq: Every day | ORAL | 11 refills | Status: DC

## 2017-10-19 MED ORDER — SUMATRIPTAN SUCCINATE 6 MG/0.5ML ~~LOC~~ SOAJ: 6.0000 mg | SUBCUTANEOUS | 11 refills | Status: DC | PRN

## 2017-10-19 MED ORDER — ONDANSETRON 4 MG PO TBDP: 4.0000 mg | ORAL_TABLET | Freq: Three times a day (TID) | ORAL | 6 refills | Status: DC | PRN

## 2017-10-19 NOTE — Telephone Encounter (Signed)
Patient requesting call back. °

## 2017-10-19 NOTE — Progress Notes (Signed)
Chief Complaint  Patient presents with  . Migraines    Rm. 4.  Last seen 10/28/15 for migraines; had Botox at that visit. Has been lost to f/u since then, b/c she lost her job.  Sts. she believes Botox helped some, but she would like to discuss other tx. options. Sts. Imitrex tablets are effective as a rescue med/fim      PATIENT: [name] DOB: Jan 20, 1960  Chief Complaint  Patient presents with  . Migraines    Rm. 4.  Last seen 10/28/15 for migraines; had Botox at that visit. Has been lost to f/u since then, b/c she lost her job.  Sts. she believes Botox helped some, but she would like to discuss other tx. options. Sts. Imitrex tablets are effective as a rescue med/fim     HISTORICAL  [name] is a 57 year old Caucasian female, came in for Botox injection for chronic migraine headaches.  HISTORY: She has a history of chronic migraine headaches for a long time, increased since 2008, her migraine usually start at one side, occipital region, spreading forward, throbbing severe headache, with smell and light and noise sensitivity,  lasting 2-3 days  About half times in a month, she would have some degree of headaches, out of those headaches, about 2-5 are severe long-lasting headache, lasting about 2-3 days,  over the past few years, she has tried different preventive medications, Topamax, cause confusion, difficulty talking, propanolol, she could not tolerate, and doesn't help, antidepression-excessive fatigue, no help.  She was able to identify trigger of her migraine, stress, menstruation period of time, sometimes woke up with severe headache,  she is currently taking Frova and Imitrex tablets, which helped her majority of the time, this is her first Botox injection as migraine prevention  She responded very well to last Botox injection, she began to notice increased frequency of headaches since June 2014, about once a week, at the maximum benefit to Botox, she only has one  to 2 headaches each month, Frova has always been able to take care of her headache, but she usually tried Imitrex tablets first, 20% of time she has to take second tablet by the afternoon, she previously responded to Imitrex subcutaneous injection very well, she worried about losing benefit of Frova with repeat use.  UPDATE June 24th 2015: She is doing well with Imitrex injection, she still has some recurrent headaches in past 3 months, responding well with imitrex injections.  UPDATE Nov 19 2014: Last BOTOX injection was in June 2015.  She lost job in June 26 2014.  She is gong through a lot stress. She complains of increased headaches since October 2016, few times a week, has to use Imitrex injection sometimes repeat dosing a day, followed by Frova next day, does help her headaches,  Previously she has tried Topamax, complains of slow thinking, which does help her headaches   UPDATE Feb 04 2015: Last BOTOX injection was in June 2015, she complains of difficulty breathing, especially at night times, after BOTOX wearing off, she began to notice improvement. She had 6 imitrex injection in Jan 2017, which did help her headache.  She had slurred words, mild memory loss with topamax.  She still looks for a job.  UPDATE May 04 2015: She return for Botox injection as migraine prevention, she continue has frequent migraine headaches, require Imitrex injection,  UPDATE July 30 2015: She responded very well to previous Botox injection, she had 7 migraines over the past 3 months, she  used Frova when she woke up with severe headache, Imitrex injection for moderate to severe headaches, both works well,  She has developed left thoracic shingle in June 2017, develop postherpetic neuralgia, has tried gabapentin 100 mg 2 tablets 3 times a day, which has made her drowsy, but did not help her neuropathic pain   Update October 28 2015:  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  [place], she chose to clinic for Botox injection as migraine prevention, she only had 11 headaches over past 3 months, responding well to Imitrex injection and Frova,  UPDATE Oct 19 2017: She has lost to follow-up since 2017 due to insurance reasons, now she has headache about once or twice each month, but some headache last about to 8 days, she is taking magnesium oxide, melatonin to help her sleep, she also complains of excessive daytime sleepiness, nodded off during the day, drowsy while driving,  She is on Effexor X are 75 mg which has helped her headache, Imitrex tablets as needed,   REVIEW OF SYSTEMS: Full 14 system review of systems performed and notable only for  as above  ALLERGIES: Allergies  Allergen Reactions  . Statins Other (See Comments)    Pt states she has severe joint and body aches.  . Codeine Hives    rash    HOME MEDICATIONS: Current Outpatient Medications  Medication Sig Dispense Refill  . aspirin 325 MG tablet Take 325 mg by mouth 2 (two) times daily.    Marland Kitchen aspirin-acetaminophen-caffeine (EXCEDRIN MIGRAINE) 250-250-65 MG tablet Take 2 tablets by mouth every 6 (six) hours as needed for headache or migraine.    Marland Kitchen ibuprofen (ADVIL,MOTRIN) 200 MG tablet Take 400 mg by mouth every 6 (six) hours as needed for mild pain.    . Magnesium 250 MG TABS Take 250 mg by mouth 2 (two) times daily.    . nabumetone (RELAFEN) 750 MG tablet TK 1 T PO BID  0  . ranitidine (ZANTAC) 150 MG capsule Take 150 mg by mouth daily. Reported on 01/09/2015    . SUMAtriptan (IMITREX) 100 MG tablet Take 1 tab at onset of migraine.  May repeat in 2 hrs, if needed.  Max dose: 2 tabs/day. This is a 90 day prescription. 27 tablet 3  . venlafaxine XR (EFFEXOR-XR) 37.5 MG 24 hr capsule Take 1 capsule (37.5 mg total) by mouth daily with breakfast. 30 capsule 0   No current facility-administered medications for this visit.     PAST MEDICAL HISTORY: Past Medical History:  Diagnosis Date  . Allergy    . Anxiety   . Blood clot in vein    right leg  . Chest tightness   . Depression   . Factor V deficiency (HCC)   . Hyperlipidemia   . Migraine   . Morbid obesity with BMI of 40.0-44.9, adult (HCC)   . Varicose vein     PAST SURGICAL HISTORY: Past Surgical History:  Procedure Laterality Date  . TUBAL LIGATION  2006  . VEIN LIGATION Bilateral     FAMILY HISTORY: Family History  Problem Relation Age of Onset  . Diabetes Mother 83       type 2  . Diabetes Father 52       type 2  . Dementia Father 26  . Hyperlipidemia Father   . Diabetes Sister   . Hyperlipidemia Sister   . Hypertension Sister   . Diabetes Brother   . Heart disease Brother   .  Hyperlipidemia Brother   . Hypertension Brother   . Breast cancer Sister     SOCIAL HISTORY:  Social History   Socioeconomic History  . Marital status: Divorced    Spouse name: Not on file  . Number of children: 0  . Years of education: 77  . Highest education level: Not on file  Occupational History  . Occupation: Engineering geologist: [employer]  Social Needs  . Financial resource strain: Not on file  . Food insecurity:    Worry: Not on file    Inability: Not on file  . Transportation needs:    Medical: Not on file    Non-medical: Not on file  Tobacco Use  . Smoking status: Never Smoker  . Smokeless tobacco: Never Used  Substance and Sexual Activity  . Alcohol use: Yes    Alcohol/week: 1.0 standard drinks    Types: 1 Glasses of wine per week    Comment: OCC  . Drug use: No  . Sexual activity: Not on file  Lifestyle  . Physical activity:    Days per week: Not on file    Minutes per session: Not on file  . Stress: Not on file  Relationships  . Social connections:    Talks on phone: Not on file    Gets together: Not on file    Attends religious service: Not on file    Active member of club or organization: Not on file    Attends meetings of clubs or organizations: Not on file     Relationship status: Not on file  . Intimate partner violence:    Fear of current or ex partner: Not on file    Emotionally abused: Not on file    Physically abused: Not on file    Forced sexual activity: Not on file  Other Topics Concern  . Not on file  Social History Narrative   Patient is divorced. Patient has a high school education and works for [employer].    Right handed.   Caffeine- two daily              PHYSICAL EXAM   Vitals:   10/19/17 0713  BP: (!) 146/94  Pulse: 76  Resp: 18  Weight: 228 lb (103.4 kg)  Height: 5\' 4"  (1.626 m)    Not recorded      Body mass index is 39.14 kg/m.  PHYSICAL EXAMNIATION:  Gen: NAD, conversant, well nourised, obese, well groomed                     Cardiovascular: Regular rate rhythm, no peripheral edema, warm, nontender. Eyes: Conjunctivae clear without exudates or hemorrhage Neck: Supple, no carotid bruise. Pulmonary: Clear to auscultation bilaterally  Skin: Left thoracic shingle rash, dried vesicle, discoloration   NEUROLOGICAL EXAM:  MENTAL STATUS: Speech:    Speech is normal; fluent and spontaneous with normal comprehension.  Cognition:     Orientation to time, place and person     Normal recent and remote memory     Normal Attention span and concentration     Normal Language, naming, repeating,spontaneous speech     Fund of knowledge   CRANIAL NERVES: CN II: Visual fields are full to confrontation. Fundoscopic exam is normal with sharp discs and no vascular changes. Pupils are round equal and briskly reactive to light. CN III, IV, VI: extraocular movement are normal. No ptosis. CN V: Facial sensation is intact to pinprick  in all 3 divisions bilaterally. Corneal responses are intact.  CN VII: Face is symmetric with normal eye closure and smile. CN VIII: Hearing is normal to rubbing fingers CN IX, X: Palate elevates symmetrically. Phonation is normal. CN XI: Head turning and shoulder shrug are intact CN  XII: Tongue is midline with normal movements and no atrophy.  MOTOR: There is no pronator drift of out-stretched arms. Muscle bulk and tone are normal. Muscle strength is normal.  REFLEXES: Reflexes are 2+ and symmetric at the biceps, triceps, knees, and ankles. Plantar responses are flexor.  SENSORY: Intact to light touch, pinprick, position sense, and vibration sense are intact in fingers and toes.  COORDINATION: Rapid alternating movements and fine finger movements are intact. There is no dysmetria on finger-to-nose and heel-knee-shin.    GAIT/STANCE: Posture is normal. Gait is steady with normal steps, base, arm swing, and turning. Heel and toe walking are normal. Tandem gait is normal.  Romberg is absent.   DIAGNOSTIC DATA (LABS, IMAGING, TESTING) - I reviewed patient records, labs, notes, testing and imaging myself where available.   ASSESSMENT AND PLAN  [name] is a 57 y.o. female   Worsening Chronic migraine  Topamax  100 mg titrating to 2 tablets every night as migraine prevention  Continue Imitrex injection, Frova  as needed  BOTOX injection was performed according to protocol by [facility]. 100 units of BOTOX was dissolved into 2 cc NS.    Total of  200   BOTOX injection was performed according to protocol by [facility]. 100 units of BOTOX was dissolved into 2 cc NS.  He used 200 units of BOTOX A  Corrugator 2 sites, 10 units Procerus 1 site, 5 unit Frontalis 4 sites,  20 units, Temporalis 8 sites,  40 units  Occipitalis 6 sites, 30 units Cervical Paraspinal, 4 sites, 20 units Trapezius, 6 sites, 30 units  Extra 45 units: Bilateral masseters 15 units each, 15 units into left parietal temporal frontal skull  Will return for repeat injection in 3 months.  Patient tolerate the injection well.  . Anxiety  keep Effexor XR 37.5 1-2 tabs po qhs.     Levert Feinstein, M.D. Ph.D.  [facility] [address], [address] [place], [place]  [address] Ph: [phone] Fax: [phone]  CC: Referring Provider     Chief Complaint  Patient presents with  . Migraines    Rm. 4.  Last seen 10/28/15 for migraines; had Botox at that visit. Has been lost to f/u since then, b/c she lost her job.  Sts. she believes Botox helped some, but she would like to discuss other tx. options. Sts. Imitrex tablets are effective as a rescue med/fim      PATIENT: [name] DOB: 1960-07-10  Chief Complaint  Patient presents with  . Migraines    Rm. 4.  Last seen 10/28/15 for migraines; had Botox at that visit. Has been lost to f/u since then, b/c she lost her job.  Sts. she believes Botox helped some, but she would like to discuss other tx. options. Sts. Imitrex tablets are effective as a rescue med/fim     HISTORICAL  [name] is a 57 year old Caucasian female, came in for Botox injection for chronic migraine headaches.  HISTORY: She has a history of chronic migraine headaches for a long time, increased since 2008, her migraine usually start at one side, occipital region, spreading forward, throbbing severe headache, with smell and light and noise sensitivity,  lasting  2-3 days  About half times in a month, she would have some degree of headaches, out of those headaches, about 2-5 are severe long-lasting headache, lasting about 2-3 days,  over the past few years, she has tried different preventive medications, Topamax, cause confusion, difficulty talking, propanolol, she could not tolerate, and doesn't help, antidepression-excessive fatigue, no help.  She was able to identify trigger of her migraine, stress, menstruation period of time, sometimes woke up with severe headache,  she is currently taking Frova and Imitrex tablets, which helped her majority of the time, this is her first Botox injection as migraine prevention  She responded very well to last Botox injection, she began to notice increased frequency of headaches since  June 2014, about once a week, at the maximum benefit to Botox, she only has one to 2 headaches each month, Frova has always been able to take care of her headache, but she usually tried Imitrex tablets first, 20% of time she has to take second tablet by the afternoon, she previously responded to Imitrex subcutaneous injection very well, she worried about losing benefit of Frova with repeat use.  UPDATE June 24th 2015: She is doing well with Imitrex injection, she still has some recurrent headaches in past 3 months, responding well with imitrex injections.  UPDATE Nov 19 2014: Last BOTOX injection was in June 2015.  She lost job in June 26 2014.  She is gong through a lot stress. She complains of increased headaches since October 2016, few times a week, has to use Imitrex injection sometimes repeat dosing a day, followed by Frova next day, does help her headaches,  Previously she has tried Topamax, complains of slow thinking, which does help her headaches   UPDATE Feb 04 2015: Last BOTOX injection was in June 2015, she complains of difficulty breathing, especially at night times, after BOTOX wearing off, she began to notice improvement. She had 6 imitrex injection in Jan 2017, which did help her headache.  She had slurred words, mild memory loss with topamax.  She still looks for a job.  UPDATE May 04 2015: She return for Botox injection as migraine prevention, she continue has frequent migraine headaches, require Imitrex injection,  UPDATE July 30 2015: She responded very well to previous Botox injection, she had 7 migraines over the past 3 months, she used Frova when she woke up with severe headache, Imitrex injection for moderate to severe headaches, both works well,  She has developed left thoracic shingle in June 2017, develop postherpetic neuralgia, has tried gabapentin 100 mg 2 tablets 3 times a day, which has made her drowsy, but did not help her neuropathic pain   REVIEW OF SYSTEMS:  Full 14 system review of systems performed and notable only for activity change, appetite change, chill, fever, runny nose, cough, wheezing, chest tightness, nausea, frequent awakening, joint pain, achy muscles, dizziness, headaches, depression  ALLERGIES: Allergies  Allergen Reactions  . Statins Other (See Comments)    Pt states she has severe joint and body aches.  . Codeine Hives    rash    HOME MEDICATIONS: Current Outpatient Medications  Medication Sig Dispense Refill  . aspirin 325 MG tablet Take 325 mg by mouth 2 (two) times daily.    Marland Kitchen aspirin-acetaminophen-caffeine (EXCEDRIN MIGRAINE) 250-250-65 MG tablet Take 2 tablets by mouth every 6 (six) hours as needed for headache or migraine.    Marland Kitchen ibuprofen (ADVIL,MOTRIN) 200 MG tablet Take 400 mg by mouth every 6 (six) hours as needed  for mild pain.    . Magnesium 250 MG TABS Take 250 mg by mouth 2 (two) times daily.    . nabumetone (RELAFEN) 750 MG tablet TK 1 T PO BID  0  . ranitidine (ZANTAC) 150 MG capsule Take 150 mg by mouth daily. Reported on 01/09/2015    . SUMAtriptan (IMITREX) 100 MG tablet Take 1 tab at onset of migraine.  May repeat in 2 hrs, if needed.  Max dose: 2 tabs/day. This is a 90 day prescription. 27 tablet 3  . venlafaxine XR (EFFEXOR-XR) 37.5 MG 24 hr capsule Take 1 capsule (37.5 mg total) by mouth daily with breakfast. 30 capsule 0   No current facility-administered medications for this visit.     PAST MEDICAL HISTORY: Past Medical History:  Diagnosis Date  . Allergy   . Anxiety   . Blood clot in vein    right leg  . Chest tightness   . Depression   . Factor V deficiency (HCC)   . Hyperlipidemia   . Migraine   . Morbid obesity with BMI of 40.0-44.9, adult (HCC)   . Varicose vein     PAST SURGICAL HISTORY: Past Surgical History:  Procedure Laterality Date  . TUBAL LIGATION  2006  . VEIN LIGATION Bilateral     FAMILY HISTORY: Family History  Problem Relation Age of Onset  . Diabetes Mother 41        type 2  . Diabetes Father 36       type 2  . Dementia Father 58  . Hyperlipidemia Father   . Diabetes Sister   . Hyperlipidemia Sister   . Hypertension Sister   . Diabetes Brother   . Heart disease Brother   . Hyperlipidemia Brother   . Hypertension Brother   . Breast cancer Sister     SOCIAL HISTORY:  Social History   Socioeconomic History  . Marital status: Divorced    Spouse name: Not on file  . Number of children: 0  . Years of education: 5  . Highest education level: Not on file  Occupational History  . Occupation: Engineering geologist: [employer]  Social Needs  . Financial resource strain: Not on file  . Food insecurity:    Worry: Not on file    Inability: Not on file  . Transportation needs:    Medical: Not on file    Non-medical: Not on file  Tobacco Use  . Smoking status: Never Smoker  . Smokeless tobacco: Never Used  Substance and Sexual Activity  . Alcohol use: Yes    Alcohol/week: 1.0 standard drinks    Types: 1 Glasses of wine per week    Comment: OCC  . Drug use: No  . Sexual activity: Not on file  Lifestyle  . Physical activity:    Days per week: Not on file    Minutes per session: Not on file  . Stress: Not on file  Relationships  . Social connections:    Talks on phone: Not on file    Gets together: Not on file    Attends religious service: Not on file    Active member of club or organization: Not on file    Attends meetings of clubs or organizations: Not on file    Relationship status: Not on file  . Intimate partner violence:    Fear of current or ex partner: Not on file    Emotionally abused: Not on file  Physically abused: Not on file    Forced sexual activity: Not on file  Other Topics Concern  . Not on file  Social History Narrative   Patient is divorced. Patient has a high school education and works for [employer].    Right handed.   Caffeine- two daily              PHYSICAL EXAM    Vitals:   10/19/17 0713  BP: (!) 146/94  Pulse: 76  Resp: 18  Weight: 228 lb (103.4 kg)  Height: 5\' 4"  (1.626 m)    Not recorded      Body mass index is 39.14 kg/m.  PHYSICAL EXAMNIATION:  Gen: NAD, conversant, well nourised, obese, well groomed                     Cardiovascular: Regular rate rhythm, no peripheral edema, warm, nontender. Eyes: Conjunctivae clear without exudates or hemorrhage Neck: Supple, no carotid bruise. Pulmonary: Clear to auscultation bilaterally  Skin: Left thoracic shingle rash, dried vesicle, discoloration   NEUROLOGICAL EXAM:  MENTAL STATUS: Speech:    Speech is normal; fluent and spontaneous with normal comprehension.  Cognition:     Orientation to time, place and person     Normal recent and remote memory     Normal Attention span and concentration     Normal Language, naming, repeating,spontaneous speech     Fund of knowledge   CRANIAL NERVES: CN II: Visual fields are full to confrontation. Fundoscopic exam is normal with sharp discs and no vascular changes. Pupils are round equal and briskly reactive to light. CN III, IV, VI: extraocular movement are normal. No ptosis. CN V: Facial sensation is intact to pinprick in all 3 divisions bilaterally. Corneal responses are intact.  CN VII: Face is symmetric with normal eye closure and smile. CN VIII: Hearing is normal to rubbing fingers CN IX, X: Palate elevates symmetrically. Phonation is normal. CN XI: Head turning and shoulder shrug are intact CN XII: Tongue is midline with normal movements and no atrophy.  MOTOR: There is no pronator drift of out-stretched arms. Muscle bulk and tone are normal. Muscle strength is normal.  REFLEXES: Reflexes are 2+ and symmetric at the biceps, triceps, knees, and ankles. Plantar responses are flexor.  SENSORY: Intact to light touch, pinprick, position sense, and vibration sense are intact in fingers and toes.  COORDINATION: Rapid alternating  movements and fine finger movements are intact. There is no dysmetria on finger-to-nose and heel-knee-shin.    GAIT/STANCE: Posture is normal. Gait is steady with normal steps, base, arm swing, and turning. Heel and toe walking are normal. Tandem gait is normal.  Romberg is absent.   DIAGNOSTIC DATA (LABS, IMAGING, TESTING) - I reviewed patient records, labs, notes, testing and imaging myself where available.   ASSESSMENT AND PLAN  [name] is a 57 y.o. female   Worsening Chronic migraine  Topamax  100 mg titrating to 2 tablets every night as migraine prevention  Continue Imitrex injection, Frova  as needed  BOTOX injection was performed according to protocol by [facility]. 100 units of BOTOX was dissolved into 2 cc NS.    Total of  200   Frontalis 4 sites,  20 units, Temporalis 8 sites,  40 units  Occipitalis 6 sites, 30 units Cervical Paraspinal, 4 sites, 20 units Trapezius, 6 sites, 30 units  Extra 60  units was injected to left skull  30  Units, right skull right parietal  region 10 Bilateral masseters,10 units each side.  Patient tolerate the injection well.  . Anxiety  keep Effexor XR 37.5 1-2 tabs po qhs.  Postherpetic neuralgia  Add on Trileptal 150mg  bid   Levert Feinstein, M.D. Ph.D.  [facility] [address], [address] [place], [place] [address] Ph: [phone] Fax: [phone]  CC: Referring Provider     Chief Complaint  Patient presents with  . Migraines    Rm. 4.  Last seen 10/28/15 for migraines; had Botox at that visit. Has been lost to f/u since then, b/c she lost her job.  Sts. she believes Botox helped some, but she would like to discuss other tx. options. Sts. Imitrex tablets are effective as a rescue med/fim      PATIENT: [name] DOB: [birth year]  Chief Complaint  Patient presents with  . Migraines    Rm. 4.  Last seen 10/28/15 for migraines; had Botox at that visit. Has been lost to f/u since then, b/c she  lost her job.  Sts. she believes Botox helped some, but she would like to discuss other tx. options. Sts. Imitrex tablets are effective as a rescue med/fim     HISTORICAL  [name] is a 57 year old Caucasian female, came in for Botox injection for chronic migraine headaches.  HISTORY: She has a history of chronic migraine headaches for a long time, increased since 2008, her migraine usually start at one side, occipital region, spreading forward, throbbing severe headache, with smell and light and noise sensitivity,  lasting 2-3 days  About half times in a month, she would have some degree of headaches, out of those headaches, about 2-5 are severe long-lasting headache, lasting about 2-3 days,  over the past few years, she has tried different preventive medications, Topamax, cause confusion, difficulty talking, propanolol, she could not tolerate, and doesn't help, antidepression-excessive fatigue, no help.  She was able to identify trigger of her migraine, stress, menstruation period of time, sometimes woke up with severe headache,  she is currently taking Frova and Imitrex tablets, which helped her majority of the time, this is her first Botox injection as migraine prevention  She responded very well to last Botox injection, she began to notice increased frequency of headaches since June 2014, about once a week, at the maximum benefit to Botox, she only has one to 2 headaches each month, Frova has always been able to take care of her headache, but she usually tried Imitrex tablets first, 20% of time she has to take second tablet by the afternoon, she previously responded to Imitrex subcutaneous injection very well, she worried about losing benefit of Frova with repeat use.  UPDATE June 24th 2015: She is doing well with Imitrex injection, she still has some recurrent headaches in past 3 months, responding well with imitrex injections.  UPDATE Nov 19 2014: Last BOTOX injection was in June  2015.  She lost job in June 26 2014.  She is gong through a lot stress. She complains of increased headaches since October 2016, few times a week, has to use Imitrex injection sometimes repeat dosing a day, followed by Frova next day, does help her headaches,  Previously she has tried Topamax, complains of slow thinking, which does help her headaches   UPDATE Feb 04 2015: Last BOTOX injection was in June 2015, she complains of difficulty breathing, especially at night times, after BOTOX wearing off, she began to notice improvement. She had 6 imitrex injection in Jan 2017, which  did help her headache.  She had slurred words, mild memory loss with topamax.  She still looks for a job.  UPDATE May 04 2015: She return for Botox injection as migraine prevention, she continue has frequent migraine headaches, require Imitrex injection,  UPDATE July 30 2015: She responded very well to previous Botox injection, she had 7 migraines over the past 3 months, she used Frova when she woke up with severe headache, Imitrex injection for moderate to severe headaches, both works well,  She has developed left thoracic shingle in June 2017, develop postherpetic neuralgia, has tried gabapentin 100 mg 2 tablets 3 times a day, which has made her drowsy, but did not help her neuropathic pain   UPDATE Oct 28 2015: She has been doing very well, had 11 migraine headaches over past 3 months, she is using Frova as needed, sumatriptan injection as needed,   REVIEW OF SYSTEMS: Full 14 system review of systems performed and notable only for activity change, appetite change, chill, fever, runny nose, cough, wheezing, chest tightness, nausea, frequent awakening, joint pain, achy muscles, dizziness, headaches, depression  ALLERGIES: Allergies  Allergen Reactions  . Statins Other (See Comments)    Pt states she has severe joint and body aches.  . Codeine Hives    rash    HOME MEDICATIONS: Current Outpatient Medications   Medication Sig Dispense Refill  . aspirin 325 MG tablet Take 325 mg by mouth 2 (two) times daily.    Marland Kitchen aspirin-acetaminophen-caffeine (EXCEDRIN MIGRAINE) 250-250-65 MG tablet Take 2 tablets by mouth every 6 (six) hours as needed for headache or migraine.    Marland Kitchen ibuprofen (ADVIL,MOTRIN) 200 MG tablet Take 400 mg by mouth every 6 (six) hours as needed for mild pain.    . Magnesium 250 MG TABS Take 250 mg by mouth 2 (two) times daily.    . nabumetone (RELAFEN) 750 MG tablet TK 1 T PO BID  0  . ranitidine (ZANTAC) 150 MG capsule Take 150 mg by mouth daily. Reported on 01/09/2015    . SUMAtriptan (IMITREX) 100 MG tablet Take 1 tab at onset of migraine.  May repeat in 2 hrs, if needed.  Max dose: 2 tabs/day. This is a 90 day prescription. 27 tablet 3  . venlafaxine XR (EFFEXOR-XR) 37.5 MG 24 hr capsule Take 1 capsule (37.5 mg total) by mouth daily with breakfast. 30 capsule 0   No current facility-administered medications for this visit.     PAST MEDICAL HISTORY: Past Medical History:  Diagnosis Date  . Allergy   . Anxiety   . Blood clot in vein    right leg  . Chest tightness   . Depression   . Factor V deficiency (HCC)   . Hyperlipidemia   . Migraine   . Morbid obesity with BMI of 40.0-44.9, adult (HCC)   . Varicose vein     PAST SURGICAL HISTORY: Past Surgical History:  Procedure Laterality Date  . TUBAL LIGATION  2006  . VEIN LIGATION Bilateral     FAMILY HISTORY: Family History  Problem Relation Age of Onset  . Diabetes Mother 77       type 2  . Diabetes Father 33       type 2  . Dementia Father 22  . Hyperlipidemia Father   . Diabetes Sister   . Hyperlipidemia Sister   . Hypertension Sister   . Diabetes Brother   . Heart disease Brother   . Hyperlipidemia Brother   . Hypertension Brother   .  Breast cancer Sister     SOCIAL HISTORY:  Social History   Socioeconomic History  . Marital status: Divorced    Spouse name: Not on file  . Number of children: 0  .  Years of education: 20  . Highest education level: Not on file  Occupational History  . Occupation: Engineering geologist: [employer]  Social Needs  . Financial resource strain: Not on file  . Food insecurity:    Worry: Not on file    Inability: Not on file  . Transportation needs:    Medical: Not on file    Non-medical: Not on file  Tobacco Use  . Smoking status: Never Smoker  . Smokeless tobacco: Never Used  Substance and Sexual Activity  . Alcohol use: Yes    Alcohol/week: 1.0 standard drinks    Types: 1 Glasses of wine per week    Comment: OCC  . Drug use: No  . Sexual activity: Not on file  Lifestyle  . Physical activity:    Days per week: Not on file    Minutes per session: Not on file  . Stress: Not on file  Relationships  . Social connections:    Talks on phone: Not on file    Gets together: Not on file    Attends religious service: Not on file    Active member of club or organization: Not on file    Attends meetings of clubs or organizations: Not on file    Relationship status: Not on file  . Intimate partner violence:    Fear of current or ex partner: Not on file    Emotionally abused: Not on file    Physically abused: Not on file    Forced sexual activity: Not on file  Other Topics Concern  . Not on file  Social History Narrative   Patient is divorced. Patient has a high school education and works for [employer].    Right handed.   Caffeine- two daily              PHYSICAL EXAM   Vitals:   10/19/17 0713  BP: (!) 146/94  Pulse: 76  Resp: 18  Weight: 228 lb (103.4 kg)  Height: 5\' 4"  (1.626 m)    Not recorded      Body mass index is 39.14 kg/m.  PHYSICAL EXAMNIATION:  Gen: NAD, conversant, well nourised, obese, well groomed                     Cardiovascular: Regular rate rhythm, no peripheral edema, warm, nontender. Eyes: Conjunctivae clear without exudates or hemorrhage Neck: Supple, no carotid  bruise. Pulmonary: Clear to auscultation bilaterally  Skin: Left thoracic shingle rash, dried vesicle, discoloration   NEUROLOGICAL EXAM:  MENTAL STATUS: Speech:    Speech is normal; fluent and spontaneous with normal comprehension.  Cognition:     Orientation to time, place and person     Normal recent and remote memory     Normal Attention span and concentration     Normal Language, naming, repeating,spontaneous speech     Fund of knowledge   CRANIAL NERVES: CN II: Visual fields are full to confrontation. Fundoscopic exam is normal with sharp discs and no vascular changes. Pupils are round equal and briskly reactive to light. CN III, IV, VI: extraocular movement are normal. No ptosis. CN V: Facial sensation is intact to pinprick in all 3 divisions bilaterally. Corneal responses are intact.  CN VII: Face is symmetric with normal eye closure and smile. CN VIII: Hearing is normal to rubbing fingers CN IX, X: Palate elevates symmetrically. Phonation is normal. CN XI: Head turning and shoulder shrug are intact CN XII: Tongue is midline with normal movements and no atrophy.  MOTOR: There is no pronator drift of out-stretched arms. Muscle bulk and tone are normal. Muscle strength is normal.  REFLEXES: Reflexes are 2+ and symmetric at the biceps, triceps, knees, and ankles. Plantar responses are flexor.  SENSORY: Intact to light touch, pinprick, position sense, and vibration sense are intact in fingers and toes.  COORDINATION: Rapid alternating movements and fine finger movements are intact. There is no dysmetria on finger-to-nose and heel-knee-shin.    GAIT/STANCE: Posture is normal. Gait is steady with normal steps, base, arm swing, and turning. Heel and toe walking are normal. Tandem gait is normal.  Romberg is absent.   DIAGNOSTIC DATA (LABS, IMAGING, TESTING) - I reviewed patient records, labs, notes, testing and imaging myself where available.   ASSESSMENT AND  PLAN  [name] is a 56 y.o. female   Chronic migraine  Continue Effexor X are 75 mg daily as preventive medications,  Magnesium oxide, riboflavin  Continue Imitrex injection for more severe headaches, Imitrex tablet for moderate migraine headaches, and mixed together with NSAIDs.  Excessive daytime sleepiness,  Possible obstructive sleep apnea  Refer her to sleep study  Levert Feinstein, M.D. Ph.D.  [facility] [address] [place], [place] [address] Ph: [phone] Fax: [phone]  CC: Referring Provider

## 2017-10-23 ENCOUNTER — Encounter: Payer: Self-pay | Admitting: Neurology

## 2017-12-12 ENCOUNTER — Telehealth: Payer: Self-pay

## 2017-12-12 ENCOUNTER — Institutional Professional Consult (permissible substitution): Payer: BLUE CROSS/BLUE SHIELD | Admitting: Neurology

## 2017-12-12 NOTE — Telephone Encounter (Signed)
Pt did not show for their appt with Dr. Frances FurbishAthar today. Pt called the day of and reported a death in the family.

## 2017-12-19 ENCOUNTER — Encounter: Payer: Self-pay | Admitting: Neurology

## 2018-01-22 ENCOUNTER — Ambulatory Visit (INDEPENDENT_AMBULATORY_CARE_PROVIDER_SITE_OTHER): Payer: 59 | Admitting: Neurology

## 2018-01-22 ENCOUNTER — Encounter: Payer: Self-pay | Admitting: Neurology

## 2018-01-22 VITALS — BP 159/91 | HR 82 | Ht 64.5 in | Wt 231.0 lb

## 2018-01-22 DIAGNOSIS — E669 Obesity, unspecified: Secondary | ICD-10-CM

## 2018-01-22 DIAGNOSIS — G4719 Other hypersomnia: Secondary | ICD-10-CM

## 2018-01-22 DIAGNOSIS — R0689 Other abnormalities of breathing: Secondary | ICD-10-CM

## 2018-01-22 DIAGNOSIS — R0681 Apnea, not elsewhere classified: Secondary | ICD-10-CM | POA: Diagnosis not present

## 2018-01-22 DIAGNOSIS — Z9189 Other specified personal risk factors, not elsewhere classified: Secondary | ICD-10-CM

## 2018-01-22 DIAGNOSIS — R519 Headache, unspecified: Secondary | ICD-10-CM

## 2018-01-22 DIAGNOSIS — R0683 Snoring: Secondary | ICD-10-CM | POA: Diagnosis not present

## 2018-01-22 DIAGNOSIS — R51 Headache: Secondary | ICD-10-CM

## 2018-01-22 DIAGNOSIS — R351 Nocturia: Secondary | ICD-10-CM

## 2018-01-22 NOTE — Patient Instructions (Signed)

## 2018-01-22 NOTE — Progress Notes (Signed)
Subjective:    Patient ID: [name] is a 58 y.o. female.  HPI     Jodi FoleySaima Donterius Filley, MD, PhD [facility] [address], [address] [address] [place], [place] [id number]  Dear Jodi EwingYijun,   I saw your patient, [name], upon your kind request in my sleep clinic today for initial consultation of her sleep disorder, in particular, concern for underlying obstructive sleep apnea. The patient is unaccompanied today. As you know, [name] is a 58 year old right-handed woman with an underlying medical history of migraine headaches, hyperlipidemia, factor V deficiency, depression, anxiety, history of DVT, history of varicose veins, and obesity, who reports snoring and excessive daytime somnolence. I reviewed your office note from 10/19/2017. She has had recurrent headaches. Her Epworth sleepiness score is 17 out of 24, fatigue score is 41 out of 63. She is divorced and lives alone, no children. She is a nonsmoker and drinks alcohol infrequently, caffeine not daily, 1-2 cups 3 or 4 times a week on average. She has a 40 minute commute and has become sleepy while driving. 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 She tries to be in bed before 10 and arise time is currently around 5. She has a dog that needs to get out around that time. She has a TV in her bedroom, typically on a sleep timer, she has had some morning headaches. She has nocturia about once per average night, is not aware of any family history of OSA. She gained weight starting in 2006. Her sister has noted her loud snoring but also pauses in her breathing and patient has woken herself up with a sense of gasping for air at times.  Her Past Medical History Is Significant For: Past Medical History:  Diagnosis Date  . Allergy   . Anxiety   . Blood clot in vein    right leg  . Chest tightness   . Depression   . Factor V deficiency (HCC)   . Hyperlipidemia   . Migraine   . Morbid obesity with BMI of 40.0-44.9, adult (HCC)   .  Varicose vein     Her Past Surgical History Is Significant For: Past Surgical History:  Procedure Laterality Date  . TUBAL LIGATION  2006  . VEIN LIGATION Bilateral     Her Family History Is Significant For: Family History  Problem Relation Age of Onset  . Diabetes Mother 7170       type 2  . Diabetes Father 4068       type 2  . Dementia Father 7380  . Hyperlipidemia Father   . Diabetes Sister   . Hyperlipidemia Sister   . Hypertension Sister   . Diabetes Brother   . Heart disease Brother   . Hyperlipidemia Brother   . Hypertension Brother   . Breast cancer Sister     Her Social History Is Significant For: Social History   Socioeconomic History  . Marital status: Divorced    Spouse name: Not on file  . Number of children: 0  . Years of education: 6912  . Highest education level: Not on file  Occupational History  . Occupation: Engineering geologistglobal quality director    Employer: [employer]  Social Needs  . Financial resource strain: Not on file  . Food insecurity:    Worry: Not on file    Inability: Not on file  . Transportation needs:    Medical: Not on file    Non-medical: Not on file  Tobacco  Use  . Smoking status: Never Smoker  . Smokeless tobacco: Never Used  Substance and Sexual Activity  . Alcohol use: Yes    Alcohol/week: 1.0 standard drinks    Types: 1 Glasses of wine per week    Comment: OCC  . Drug use: No  . Sexual activity: Not on file  Lifestyle  . Physical activity:    Days per week: Not on file    Minutes per session: Not on file  . Stress: Not on file  Relationships  . Social connections:    Talks on phone: Not on file    Gets together: Not on file    Attends religious service: Not on file    Active member of club or organization: Not on file    Attends meetings of clubs or organizations: Not on file    Relationship status: Not on file  Other Topics Concern  . Not on file  Social History Narrative   Patient is divorced. Patient has a high  school education and works for [employer].    Right handed.   Caffeine- two daily             Her Allergies Are:  Allergies  Allergen Reactions  . Statins Other (See Comments)    Pt states she has severe joint and body aches.  . Codeine Hives    rash  :   Her Current Medications Are:  Outpatient Encounter Medications as of 01/22/2018  Medication Sig  . aspirin 325 MG tablet Take 325 mg by mouth 2 (two) times daily.  Marland Kitchen aspirin-acetaminophen-caffeine (EXCEDRIN MIGRAINE) 250-250-65 MG tablet Take 2 tablets by mouth every 6 (six) hours as needed for headache or migraine.  . Biotin 06237 MCG TABS Take by mouth.  . cimetidine (TAGAMET) 200 MG tablet Take 200 mg by mouth daily as needed.  . frovatriptan (FROVA) 2.5 MG tablet Take 2.5 mg by mouth as needed for migraine. If recurs, may repeat after 2 hours. Max of 3 tabs in 24 hours.  . Garlic 1000 MG CAPS Take by mouth.  Marland Kitchen ibuprofen (ADVIL,MOTRIN) 200 MG tablet Take 400 mg by mouth every 6 (six) hours as needed for mild pain.  . Magnesium 250 MG TABS Take 250 mg by mouth 2 (two) times daily.  . ondansetron (ZOFRAN ODT) 4 MG disintegrating tablet Take 1 tablet (4 mg total) by mouth every 8 (eight) hours as needed.  . SUMAtriptan (IMITREX STATDOSE SYSTEM) 6 MG/0.5ML SOAJ Inject 6 mg into the skin as needed.  . SUMAtriptan (IMITREX) 100 MG tablet Take 1 tab at onset of migraine.  May repeat in 2 hrs, if needed.  Max dose: 2 tabs/day. This is a 90 day prescription.  Marland Kitchen venlafaxine XR (EFFEXOR-XR) 75 MG 24 hr capsule Take 1 capsule (75 mg total) by mouth daily with breakfast.  . [DISCONTINUED] nabumetone (RELAFEN) 750 MG tablet TK 1 T PO BID  . [DISCONTINUED] ranitidine (ZANTAC) 150 MG capsule Take 150 mg by mouth daily. Reported on 01/09/2015   No facility-administered encounter medications on file as of 01/22/2018.   :  Review of Systems:  Out of a complete 14 point review of systems, all are reviewed and negative with the exception of these  symptoms as listed below: Review of Systems  Neurological:       Pt presents today to discuss her sleep. Pt has never had a sleep study but does endorse snoring.  Epworth Sleepiness Scale 0= would never doze 1= slight chance of  dozing 2= moderate chance of dozing 3= high chance of dozing  Sitting and reading: 3 Watching TV: 3 Sitting inactive in a public place (ex. Theater or meeting): 2 As a passenger in a car for an hour without a break: 3 Lying down to rest in the afternoon: 3 Sitting and talking to someone: 0 Sitting quietly after lunch (no alcohol): 1 In a car, while stopped in traffic: 2 Total: 17     Objective:  Neurological Exam  Physical Exam Physical Examination:   Vitals:   01/22/18 1533  BP: (!) 159/91  Pulse: 82    General Examination: The patient is a very pleasant 58 y.o. female in no acute distress. She appears well-developed and well-nourished and well groomed.   HEENT: Normocephalic, atraumatic, pupils are equal, round and reactive to light and accommodation. Extraocular tracking is good without limitation to gaze excursion or nystagmus noted. Normal smooth pursuit is noted. Hearing is grossly intact. Face is symmetric with normal facial animation and normal facial sensation. Speech is clear with no dysarthria noted. There is no hypophonia. There is no lip, neck/head, jaw or voice tremor. Neck is supple with full range of passive and active motion. There are no carotid bruits on auscultation. Oropharynx exam reveals: mild mouth dryness, good dental hygiene and mild airway crowding, due to Smaller airway opening, slightly wider uvula, tonsils are 1+. Neck circumference is 15-3/4 inches, she has a mild overbite. Mallampati is class II.  Chest: Clear to auscultation without wheezing, rhonchi or crackles noted.  Heart: S1+S2+0, regular and normal without murmurs, rubs or gallops noted.   Abdomen: Soft, non-tender and non-distended with normal bowel sounds  appreciated on auscultation.  Extremities: There is no pitting edema in the distal lower extremities bilaterally.   Skin: Warm and dry without trophic changes noted.  Musculoskeletal: exam reveals no obvious joint deformities, tenderness or joint swelling or erythema.   Neurologically:  Mental status: The patient is awake, alert and oriented in all 4 spheres. Her immediate and remote memory, attention, language skills and fund of knowledge are appropriate. There is no evidence of aphasia, agnosia, apraxia or anomia. Speech is clear with normal prosody and enunciation. Thought process is linear. Mood is normal and affect is normal.  Cranial nerves II - XII are as described above under HEENT exam. In addition: shoulder shrug is normal with equal shoulder height noted. Motor exam: Normal bulk, strength and tone is noted. There is no drift, tremor or rebound. Romberg is negative. Reflexes are 2+ throughout. Fine motor skills and coordination: grossly intact.  Cerebellar testing: No dysmetria or intention tremor. There is no truncal or gait ataxia.  Sensory exam: intact to light touch.  Gait, station and balance: She stands easily. No veering to one side is noted. No leaning to one side is noted. Posture is age-appropriate and stance is narrow based. Gait shows normal stride length and normal pace. No problems turning are noted.   Assessment and Plan:   In summary, SYNDEE ZIEMAN is a very pleasant 58 y.o.-year old female with an underlying medical history of migraine headaches, hyperlipidemia, factor V deficiency, depression, anxiety, history of DVT, history of varicose veins, and obesity, whose history and physical exam are concerning for obstructive sleep apnea (OSA). I had a long chat with the patient about my findings and the diagnosis of OSA, its prognosis and treatment options. We talked about medical treatments, surgical interventions and non-pharmacological approaches. I explained in particular  the risks and ramifications of  untreated moderate to severe OSA, especially with respect to developing cardiovascular disease down the Road, including congestive heart failure, difficult to treat hypertension, cardiac arrhythmias, or stroke. Even type 2 diabetes has, in part, been linked to untreated OSA. Symptoms of untreated OSA include daytime sleepiness, memory problems, mood irritability and mood disorder such as depression and anxiety, lack of energy, as well as recurrent headaches, especially morning headaches. We talked about trying to maintain a healthy lifestyle in general, as well as the importance of weight control. I encouraged the patient to eat healthy, exercise daily and keep well hydrated, to keep a scheduled bedtime and wake time routine, to not skip any meals and eat healthy snacks in between meals. I advised the patient not to drive when feeling sleepy.  I recommended the following at this time: sleep study with potential positive airway pressure titration. (We will score hypopneas at 4%).   I explained the sleep test procedure to the patient and also outlined possible surgical and non-surgical treatment options of OSA, including the use of a custom-made dental device (which would require a referral to a specialist dentist or oral surgeon), upper airway surgical options, such as pillar implants, radiofrequency surgery, tongue base surgery, and UPPP (which would involve a referral to an ENT surgeon). Rarely, jaw surgery such as mandibular advancement may be considered.  I also explained the CPAP treatment option to the patient, who indicated that she would be willing to try CPAP if the need arises. I explained the importance of being compliant with PAP treatment, not only for insurance purposes but primarily to improve Her symptoms, and for the patient's long term health benefit, including to reduce Her cardiovascular risks. I answered all her questions today and the patient was in agreement.  I plan to see her back after the sleep study is completed and encouraged her to call with any interim questions, concerns, problems or updates.   Thank you very much for allowing me to participate in the care of this nice patient. If I can be of any further assistance to you please do not hesitate to call me at [phone].  Sincerely,   Jodi FoleySaima Roanna Reaves, MD, PhD

## 2018-02-07 ENCOUNTER — Institutional Professional Consult (permissible substitution): Payer: BLUE CROSS/BLUE SHIELD | Admitting: Neurology

## 2018-02-14 ENCOUNTER — Ambulatory Visit (INDEPENDENT_AMBULATORY_CARE_PROVIDER_SITE_OTHER): Payer: 59 | Admitting: Neurology

## 2018-02-14 DIAGNOSIS — R51 Headache: Secondary | ICD-10-CM

## 2018-02-14 DIAGNOSIS — G4733 Obstructive sleep apnea (adult) (pediatric): Secondary | ICD-10-CM

## 2018-02-14 DIAGNOSIS — R519 Headache, unspecified: Secondary | ICD-10-CM

## 2018-02-14 DIAGNOSIS — Z9189 Other specified personal risk factors, not elsewhere classified: Secondary | ICD-10-CM

## 2018-02-14 DIAGNOSIS — R0683 Snoring: Secondary | ICD-10-CM

## 2018-02-14 DIAGNOSIS — R351 Nocturia: Secondary | ICD-10-CM

## 2018-02-14 DIAGNOSIS — R0689 Other abnormalities of breathing: Secondary | ICD-10-CM

## 2018-02-14 DIAGNOSIS — G4719 Other hypersomnia: Secondary | ICD-10-CM

## 2018-02-14 DIAGNOSIS — R0681 Apnea, not elsewhere classified: Secondary | ICD-10-CM

## 2018-02-14 DIAGNOSIS — E669 Obesity, unspecified: Secondary | ICD-10-CM

## 2018-02-19 NOTE — Progress Notes (Signed)
Patient referred by Dr. Terrace Arabia, seen by me on 01/22/18, HST on 02/14/18.    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 While I recommend treatment for this in the form CPAP, her insurance will not approve a sleep study for this. They will likely only approve a trial of autoPAP, which means, that we don't have to bring her in for a sleep study with CPAP, but will let her try an autoPAP machine at home, through a DME company (of her choice, or as per insurance requirement). The DME representative will educate her on how to use the machine, how to put the mask on, etc. I have placed an order in the chart. Please send referral, talk to patient, send report to referring MD. We will need a FU in sleep clinic for 10 weeks post-PAP set up, please arrange that with me or one of our NPs. Thanks,   Huston Foley, MD, PhD [facility])

## 2018-02-19 NOTE — Procedures (Signed)
[facility] Sleep @[facility] [address] [place], [place] [address] NAME:    [name]                                                                         DOB: 02/21/1960 MEDICAL RECORD no:  [id number]                                                    DOS: 02/14/2018  REFERRING PHYSICIAN: Levert Feinstein, MD STUDY PERFORMED: Home Sleep Test on Watch Pat HISTORY: 58 year old woman with a history of migraine headaches, hyperlipidemia, factor V deficiency, depression, anxiety, history of DVT, history of varicose veins, and obesity, who reports snoring and excessive daytime somnolence. Her Epworth sleepiness score is 17 out of 24. BMI of 39.5. STUDY RESULTS:   Total Recording Time: 7 hrs, 39 mins; Total Sleep Time: 7 hrs, Total Apnea/Hypopnea Index (AHI): 51.7/h; RDI: 52.5 /h; REM AHI: 77.0/h Average Oxygen Saturation: 93%; Lowest Oxygen Desaturation: 69%  Total Time Oxygen Saturation Below or at 88%: 30.6 minutes  Average Heart Rate: 71 bpm (between 44 and 103 bpm) IMPRESSION: OSA RECOMMENDATION: This home sleep test demonstrates severe obstructive sleep apnea with a total AHI of 51.7/hour and O2 nadir of 69%. Given the patient's medical history and sleep related complaints, treatment with positive airway pressure (in the form of CPAP) is recommended. This will require a full night CPAP titration study for proper treatment settings, O2 monitoring and mask fitting. Based on the severity of the sleep disordered breathing, an attended titration study is indicated. However, patient's insurance has denied an attended sleep study; therefore, the patient will be advised to proceed with an autoPAP titration/trial at home for now. Please note that untreated obstructive sleep apnea may carry additional perioperative morbidity. Patients with significant obstructive sleep apnea should receive perioperative PAP therapy and the surgeons and particularly the anesthesiologist should be  informed of the diagnosis and the severity of the sleep disordered breathing. The patient should be cautioned not to drive, work at heights, or operate dangerous or heavy equipment when tired or sleepy. Review and reiteration of good sleep hygiene measures should be pursued with any patient. Weight loss is recommended.  Other causes of the patient's symptoms, including circadian rhythm disturbances, an underlying mood disorder, medication effect and/or an underlying medical problem cannot be ruled out based on this test. Clinical correlation is recommended. The patient and his referring provider will be notified of the test results. The patient will be seen in follow up in sleep clinic at [facility].  I certify that I have reviewed the raw data recording prior to the issuance of this report in accordance with the standards of the American Academy of Sleep Medicine (AASM).  Huston Foley, MD, PhD [facility]) Diplomat, ABPN (Neurology and Sleep)

## 2018-02-19 NOTE — Addendum Note (Signed)
Addended by: Huston Foley on: 02/19/2018 08:28 AM   Modules accepted: Orders

## 2018-02-20 ENCOUNTER — Telehealth: Payer: Self-pay

## 2018-02-20 NOTE — Telephone Encounter (Signed)
-----   Message from Huston Foley, MD sent at 02/19/2018  8:28 AM EST ----- Patient referred by Dr. Terrace Arabia, seen by me on 01/22/18, HST on 02/14/18.    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 While I recommend treatment for this in the form CPAP, her insurance will not approve a sleep study for this. They will likely only approve a trial of autoPAP, which means, that we don't have to bring her in for a sleep study with CPAP, but will let her try an autoPAP machine at home, through a DME company (of her choice, or as per insurance requirement). The DME representative will educate her on how to use the machine, how to put the mask on, etc. I have placed an order in the chart. Please send referral, talk to patient, send report to referring MD. We will need a FU in sleep clinic for 10 weeks post-PAP set up, please arrange that with me or one of our NPs. Thanks,   Huston Foley, MD, PhD [facility])

## 2018-02-20 NOTE — Telephone Encounter (Signed)
I called pt. I advised pt that Dr. Frances Furbish reviewed their sleep study results and found that pt has severe osa. Dr. Frances Furbish recommends that pt start an auto pap at home since pt's insurance is likely to deny an in lab cpap titration study. I reviewed PAP compliance expectations with the pt. Pt is agreeable to starting an auto-PAP. I advised pt that an order will be sent to a DME, [facility], and [facility] will call the pt within about one week after they file with the pt's insurance. [facility] will show the pt how to use the machine, fit for masks, and troubleshoot the auto-PAP if needed. A follow up appt was made for insurance purposes with Dr. Frances Furbish on 05/08/2018 at 9:30am. Pt verbalized understanding to arrive 15 minutes early and bring their auto-PAP. A letter with all of this information in it will be mailed to the pt as a reminder. I verified with the pt that the address we have on file is correct. Pt verbalized understanding of results. Pt had no questions at this time but was encouraged to call back if questions arise. I have sent the order to [facility] and have received confirmation that they have received the order.

## 2018-03-19 ENCOUNTER — Telehealth: Payer: Self-pay | Admitting: Neurology

## 2018-03-19 NOTE — Telephone Encounter (Signed)
I called pt. She has been using her cpap every night but still feels very sleepy. Pt was set up on 02/23/2018. She is agreeable to a sooner appt on 03/26/2018 at 1:00pm and the April 21st, 2020 appt with Dr. Frances Furbish was cancelled.

## 2018-03-19 NOTE — Telephone Encounter (Signed)
Pt states she has had her CPAP for about a month but she is still tired and very sleepy driving to work and while at work.  Pt is asking for a call to discuss

## 2018-03-25 ENCOUNTER — Encounter: Payer: Self-pay | Admitting: Neurology

## 2018-03-26 ENCOUNTER — Encounter: Payer: Self-pay | Admitting: Neurology

## 2018-03-26 ENCOUNTER — Ambulatory Visit: Payer: 59 | Admitting: Neurology

## 2018-03-26 VITALS — BP 133/85 | HR 77 | Ht 65.0 in | Wt 238.0 lb

## 2018-03-26 DIAGNOSIS — Z9989 Dependence on other enabling machines and devices: Secondary | ICD-10-CM

## 2018-03-26 DIAGNOSIS — G4734 Idiopathic sleep related nonobstructive alveolar hypoventilation: Secondary | ICD-10-CM | POA: Diagnosis not present

## 2018-03-26 DIAGNOSIS — G4710 Hypersomnia, unspecified: Secondary | ICD-10-CM | POA: Diagnosis not present

## 2018-03-26 DIAGNOSIS — G4733 Obstructive sleep apnea (adult) (pediatric): Secondary | ICD-10-CM | POA: Diagnosis not present

## 2018-03-26 DIAGNOSIS — G4730 Sleep apnea, unspecified: Secondary | ICD-10-CM

## 2018-03-26 NOTE — Patient Instructions (Addendum)
Please continue using your autoPAP regularly. While your insurance requires that you use PAP at least 4 hours each night on 70% of the nights, I recommend, that you not skip any nights and use it throughout the night if you can. Getting used to PAP and staying with the treatment long term does take time and patience and discipline. Untreated obstructive sleep apnea when it is moderate to severe can have an adverse impact on cardiovascular health and raise her risk for heart disease, arrhythmias, hypertension, congestive heart failure, stroke and diabetes. Untreated obstructive sleep apnea causes sleep disruption, nonrestorative sleep, and sleep deprivation. This can have an impact on your day to day functioning and cause daytime sleepiness and impairment of cognitive function, memory loss, mood disturbance, and problems focussing. Using PAP regularly can improve these symptoms.  As discussed, we will do an overnight oxygen level test, called ONO, and your DME company will call and set this up for one night, while you also use your autoPAP as usual. We will call you with the results. This is to make sure that your oxygen levels stay in the 90s, while you are treated with autoPAP for your OSA. Remember, your oxygen levels dropped into the 60s (69%) during the home sleep test.   Let's re-evaluate things in about 3 months, if you are still very sleepy, we may have to do additional sleep test. However, your sleepiness may have increased, when you increased the Effexor XR from 37.5 to 75 mg, I believe in October 2019. Please talk to Dr. Terrace Arabia about reducing back to 37.5 mg again especially, since your headaches are better.

## 2018-03-26 NOTE — Progress Notes (Signed)
Subjective:    Patient ID: [name] is a 59 y.o. female.  HPI    Interim history:   [name] is a 58 year old right-handed woman with an underlying medical history of migraine headaches, hyperlipidemia, factor V deficiency, depression, anxiety, history of DVT, history of varicose veins, and obesity, who presents for follow-up consultation of her obstructive sleep apnea, after a recent onset sleep testing and starting AutoPap therapy. The patient is unaccompanied today. I first met her on 01/22/2018 at the request of [name], at which time she reported snoring and daytime somnolence as well as witnessed apneas. Her Epworth sleepiness score was 17 at the time.  Today, 03/26/2018: I reviewed her AutoPap compliance data from 02/24/2018 through 03/25/2018 which is a total of 30 days, during which time she used her AutoPap every night with percent used days greater than 4 hours at 100%, indicating superb compliance with an average usage of 6 hours and 46 minutes, residual AHI at goal at 0.5 per hour, 95th percentile pressure at 11.7 cm, leak on the high side with the 95th percentile at 19.8 L/m on a pressure range of 7 cm to 13 cm. She reports that she is still sleepy. She even feels that she is more sleepy as she has started using the AutoPap. Her Epworth sleepiness score is 19 out of 24 today. Looking back at her medication history, she restarted Effexor long-acting in September 2019, she had stopped it in or around May 2018. When she saw [name] in October 2019 the Effexor was increased to 75 mg daily. She takes it at night. She does admit that it could be correlated. She also has a dog that has to be up early, she tries to keep a scheduled for this. She tries to keep a schedule on the weekends 2. She does report that her headaches are better all month of February since she has been consistent with her AutoPap. In fact, her headaches are much improved. Previously, she had been on Topamax which caused  side effects and she also tried talks from migraines. She is motivated to continue with treatment. She has gained weight over time. She has a more sedentary job for the past year.   The patient's allergies, current medications, family history, past medical history, past social history, past surgical history and problem list were reviewed and updated as appropriate.   Previously:   01/22/2018: She reports snoring and excessive daytime somnolence. I reviewed your office note from 10/19/2017. She has had recurrent headaches. Her Epworth sleepiness score is 17 out of 24, fatigue score is 41 out of 63. She is divorced and lives alone, no children. She is a nonsmoker and drinks alcohol infrequently, caffeine not daily, 1-2 cups 3 or 4 times a week on average. She has a 40 minute commute and has become sleepy while driving. She works as an Lawyer. She tries to be in bed before 10 and arise time is currently around 5. She has a dog that needs to get out around that time. She has a TV in her bedroom, typically on a sleep timer, she has had some morning headaches. She has nocturia about once per average night, is not aware of any family history of OSA. She gained weight starting in 2006. Her sister has noted her loud snoring but also pauses in her breathing and patient has woken herself up with a sense of gasping for air at times.  Her Past Medical History Is Significant For:  Past Medical History:  Diagnosis Date  . Allergy   . Anxiety   . Blood clot in vein    right leg  . Chest tightness   . Depression   . Factor V deficiency ([place])   . Hyperlipidemia   . Migraine   . Morbid obesity with BMI of 40.0-44.9, adult ([place])   . Varicose vein     Her Past Surgical History Is Significant For: Past Surgical History:  Procedure Laterality Date  . TUBAL LIGATION  2006  . VEIN LIGATION Bilateral     Her Family History Is Significant For: Family History  Problem Relation Age of Onset  .  Diabetes Mother 69       type 2  . Diabetes Father 67       type 2  . Dementia Father 71  . Hyperlipidemia Father   . Diabetes Sister   . Hyperlipidemia Sister   . Hypertension Sister   . Diabetes Brother   . Heart disease Brother   . Hyperlipidemia Brother   . Hypertension Brother   . Breast cancer Sister     Her Social History Is Significant For: Social History   Socioeconomic History  . Marital status: Divorced    Spouse name: Not on file  . Number of children: 0  . Years of education: 56  . Highest education level: Not on file  Occupational History  . Occupation: Higher education careers adviser: [name]  Social Needs  . Financial resource strain: Not on file  . Food insecurity:    Worry: Not on file    Inability: Not on file  . Transportation needs:    Medical: Not on file    Non-medical: Not on file  Tobacco Use  . Smoking status: Never Smoker  . Smokeless tobacco: Never Used  Substance and Sexual Activity  . Alcohol use: Yes    Alcohol/week: 1.0 standard drinks    Types: 1 Glasses of wine per week    Comment: OCC  . Drug use: No  . Sexual activity: Not on file  Lifestyle  . Physical activity:    Days per week: Not on file    Minutes per session: Not on file  . Stress: Not on file  Relationships  . Social connections:    Talks on phone: Not on file    Gets together: Not on file    Attends religious service: Not on file    Active member of club or organization: Not on file    Attends meetings of clubs or organizations: Not on file    Relationship status: Not on file  Other Topics Concern  . Not on file  Social History Narrative   Patient is divorced. Patient has a high school education and works for [employer].    Right handed.   Caffeine- two daily             Her Allergies Are:  Allergies  Allergen Reactions  . Statins Other (See Comments)    Pt states she has severe joint and body aches.  . Codeine Hives    rash  :    Her Current Medications Are:  Outpatient Encounter Medications as of 03/26/2018  Medication Sig  . aspirin 325 MG tablet Take 325 mg by mouth 2 (two) times daily.  . Biotin 10000 MCG TABS Take by mouth.  . Cholecalciferol (VITAMIN D3 PO) Take 5,000 Units by mouth daily.  . cimetidine (TAGAMET) 200 MG  tablet Take 200 mg by mouth daily as needed.  . frovatriptan (FROVA) 2.5 MG tablet Take 2.5 mg by mouth as needed for migraine. If recurs, may repeat after 2 hours. Max of 3 tabs in 24 hours.  . Garlic 5945 MG CAPS Take by mouth.  Marland Kitchen ibuprofen (ADVIL,MOTRIN) 200 MG tablet Take 400 mg by mouth every 6 (six) hours as needed for mild pain.  Marland Kitchen KRILL OIL PO Take 750 mg by mouth daily.  . Magnesium 250 MG TABS Take 250 mg by mouth 2 (two) times daily.  Marland Kitchen MAGNESIUM PO Take 250 mg by mouth daily.  . SUMAtriptan (IMITREX STATDOSE SYSTEM) 6 MG/0.5ML SOAJ Inject 6 mg into the skin as needed.  . SUMAtriptan (IMITREX) 100 MG tablet Take 1 tab at onset of migraine.  May repeat in 2 hrs, if needed.  Max dose: 2 tabs/day. This is a 90 day prescription.  Marland Kitchen venlafaxine XR (EFFEXOR-XR) 75 MG 24 hr capsule Take 1 capsule (75 mg total) by mouth daily with breakfast.  . [DISCONTINUED] aspirin-acetaminophen-caffeine (EXCEDRIN MIGRAINE) 250-250-65 MG tablet Take 2 tablets by mouth every 6 (six) hours as needed for headache or migraine.  . [DISCONTINUED] ondansetron (ZOFRAN ODT) 4 MG disintegrating tablet Take 1 tablet (4 mg total) by mouth every 8 (eight) hours as needed.   No facility-administered encounter medications on file as of 03/26/2018.   :  Review of Systems:  Out of a complete 14 point review of systems, all are reviewed and negative with the exception of these symptoms as listed below:  Review of Systems  Neurological:       Pt presents today to discuss her cpap. She has had no trouble adjusting to cpap but feels even more sleepy with cpap the more hours she uses it.  Epworth Sleepiness Scale 0= would  never doze 1= slight chance of dozing 2= moderate chance of dozing 3= high chance of dozing  Sitting and reading: 3 Watching TV: 3 Sitting inactive in a public place (ex. Theater or meeting): 2 As a passenger in a car for an hour without a break: 2 Lying down to rest in the afternoon: 3 Sitting and talking to someone: 0 Sitting quietly after lunch (no alcohol): 3 In a car, while stopped in traffic: 3 Total: 19     Objective:  Neurological Exam  Physical Exam Physical Examination:   Vitals:   03/26/18 1259  BP: 133/85  Pulse: 77    General Examination: The patient is a very pleasant 58 y.o. female in no acute distress. She appears well-developed and well-nourished and well groomed. Good spirits.   HEENT: Normocephalic, atraumatic, pupils are equal, round and reactive to light and accommodation. Extraocular tracking is good without limitation to gaze excursion or nystagmus noted. Normal smooth pursuit is noted. Hearing is grossly intact. Face is symmetric with normal facial animation and normal facial sensation. Speech is clear with no dysarthria noted. There is no hypophonia. There is no lip, neck/head, jaw or voice tremor. Neck with FROM. Oropharynx exam reveals: mild mouth dryness, good dental hygiene and mild airway crowding. Tongue protrudes centrally and palate elevates symmetrically.  Chest: Clear to auscultation without wheezing, rhonchi or crackles noted.  Heart: S1+S2+0, regular and normal without murmurs, rubs or gallops noted.   Abdomen: Soft, non-tender and non-distended.  Extremities: There is no pitting edema in the distal lower extremities bilaterally.   Skin: Warm and dry without trophic changes noted.  Musculoskeletal: exam reveals no obvious joint deformities, tenderness or  joint swelling or erythema.   Neurologically:  Mental status: The patient is awake, alert and oriented in all 4 spheres. Her immediate and remote memory, attention, language  skills and fund of knowledge are appropriate. There is no evidence of aphasia, agnosia, apraxia or anomia. Speech is clear with normal prosody and enunciation. Thought process is linear. Mood is normal and affect is normal.  Cranial nerves II - XII are as described above under HEENT exam. Motor exam: Normal bulk, strength and tone is noted. There is no drift, tremor or rebound. Fine motor skills and coordination: grossly intact.  Cerebellar testing: No dysmetria or intention tremor. There is no truncal or gait ataxia.  Sensory exam: intact to light touch.  Gait, station and balance: She stands easily. No veering to one side is noted. No leaning to one side is noted. Posture is age-appropriate and stance is narrow based. Gait shows normal stride length and normal pace. No problems turning are noted.   Assessment and Plan:   In summary, HENNIE GOSA is a very pleasant 58 year old female with an underlying medical history of migraine headaches, hyperlipidemia, factor V deficiency, depression, anxiety, history of DVT, history of varicose veins, and obesity, who presents for follow-up consultation of her obstructive sleep apnea (OSA), which was determined to be in the severe range by home sleep testing on 02/14/2018. She has established treatment for the past month with AutoPap therapy and is fully compliant with it. She is commended for treatment adherence. While her headaches are much improved, she still is quite sleepy during the day. In looking back at her medication history, she may have had increase in her sleepiness after her Effexor long-acting was increased from 37.5 mg strength to 75 mg daily. She takes it at night. Since her headaches are improved possibly in part because of treating her sleep apnea, I suggested she talk with [name] about potentially reducing the Effexor again to the smaller dose. She would be willing to consider this. We will reevaluate things in about 3 months and in the interim  I would like to proceed with a pulse oximetry test to make sure her oxygen saturations are adequate while she is on AutoPap therapy. Her apnea score is low thankfully, while on treatment. We talked about her home sleep test results as well as her compliance data.  We will keep her posted as to her ONO results. If she continues to be very sleepy, we may consider bringing her in for additional sleep study testing in the form of nocturnal sleep study with next a nap study in the near future. I answered all her questions today and the patient was in agreement.

## 2018-03-26 NOTE — Progress Notes (Signed)
Order for ONO on autopap sent to [facility] via community message. Confirmation received that the order transmitted was successful.  

## 2018-04-04 ENCOUNTER — Telehealth: Payer: Self-pay | Admitting: Neurology

## 2018-04-04 NOTE — Telephone Encounter (Signed)
Pt called and stated that she was to inform us that [name] has not reached out to her yet to go get her equipment. Please advise.

## 2018-04-04 NOTE — Telephone Encounter (Signed)
I have reached out to [facility] for assistance. 

## 2018-04-05 NOTE — Telephone Encounter (Signed)
Received this notice from [facility]:  "I heard back from my RT team and they have her on their schedule for tomorrow to call. They have all of their units out right now but expect some to come back tomorrow."

## 2018-04-05 NOTE — Telephone Encounter (Signed)
I called pt. I advised her of [facility] message. Pt verbalized understanding.

## 2018-04-18 ENCOUNTER — Encounter: Payer: Self-pay | Admitting: Neurology

## 2018-04-19 ENCOUNTER — Ambulatory Visit (INDEPENDENT_AMBULATORY_CARE_PROVIDER_SITE_OTHER): Payer: 59 | Admitting: Neurology

## 2018-04-19 ENCOUNTER — Other Ambulatory Visit: Payer: Self-pay

## 2018-04-19 ENCOUNTER — Encounter: Payer: Self-pay | Admitting: Neurology

## 2018-04-19 DIAGNOSIS — G43719 Chronic migraine without aura, intractable, without status migrainosus: Secondary | ICD-10-CM | POA: Diagnosis not present

## 2018-04-19 DIAGNOSIS — G4730 Sleep apnea, unspecified: Secondary | ICD-10-CM

## 2018-04-19 MED ORDER — VENLAFAXINE HCL ER 75 MG PO CP24: 75.0000 mg | ORAL_CAPSULE | Freq: Every day | ORAL | 4 refills | Status: DC

## 2018-04-19 NOTE — Progress Notes (Signed)
No chief complaint on file.     PATIENT: [name] [facility] DOB: 12-10-1960  No chief complaint on file.    HISTORICAL  ZERA AMMANN is a 58 year old Caucasian female, came in for Botox injection for chronic migraine headaches.  HISTORY: She has a history of chronic migraine headaches for a long time, increased since 2008, her migraine usually start at one side, occipital region, spreading forward, throbbing severe headache, with smell and light and noise sensitivity,  lasting 2-3 days  About half times in a month, she would have some degree of headaches, out of those headaches, about 2-5 are severe long-lasting headache, lasting about 2-3 days,  over the past few years, she has tried different preventive medications, Topamax, cause confusion, difficulty talking, propanolol, she could not tolerate, and doesn't help, antidepression-excessive fatigue, no help.  She was able to identify trigger of her migraine, stress, menstruation period of time, sometimes woke up with severe headache,  she is currently taking Frova and Imitrex tablets, which helped her majority of the time, this is her first Botox injection as migraine prevention  She responded very well to last Botox injection, she began to notice increased frequency of headaches since June 2014, about once a week, at the maximum benefit to Botox, she only has one to 2 headaches each month, Frova has always been able to take care of her headache, but she usually tried Imitrex tablets first, 20% of time she has to take second tablet by the afternoon, she previously responded to Imitrex subcutaneous injection very well, she worried about losing benefit of Frova with repeat use.  UPDATE June 24th 2015: She is doing well with Imitrex injection, she still has some recurrent headaches in past 3 months, responding well with imitrex injections.  UPDATE Nov 19 2014: Last BOTOX injection was in June 2015.  She lost job in June 26 2014.  She is gong  through a lot stress. She complains of increased headaches since October 2016, few times a week, has to use Imitrex injection sometimes repeat dosing a day, followed by Frova next day, does help her headaches,  Previously she has tried Topamax, complains of slow thinking, which does help her headaches   UPDATE Feb 04 2015: Last BOTOX injection was in June 2015, she complains of difficulty breathing, especially at night times, after BOTOX wearing off, she began to notice improvement. She had 6 imitrex injection in Jan 2017, which did help her headache.  She had slurred words, mild memory loss with topamax.  She still looks for a job.  UPDATE May 04 2015: She return for Botox injection as migraine prevention, she continue has frequent migraine headaches, require Imitrex injection,  UPDATE July 30 2015: She responded very well to previous Botox injection, she had 7 migraines over the past 3 months, she used Frova when she woke up with severe headache, Imitrex injection for moderate to severe headaches, both works well,  She has developed left thoracic shingle in June 2017, develop postherpetic neuralgia, has tried gabapentin 100 mg 2 tablets 3 times a day, which has made her drowsy, but did not help her neuropathic pain   Update October 28 2015:  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 [place], she chose to clinic for Botox injection as migraine prevention, she only had 11 headaches over past 3 months, responding well to Imitrex injection and Frova,  UPDATE Oct 19 2017: She has lost to follow-up since 2017 due  to insurance reasons, now she has headache about once or twice each month, but some headache last about to 8 days, she is taking magnesium oxide, melatonin to help her sleep, she also complains of excessive daytime sleepiness, nodded off during the day, drowsy while driving,  Virtual Visit via phone  I connected with [name] on 04/19/18 at  By phone  and verified that I am speaking with the correct person using two identifiers.   I discussed the limitations, risks, security and privacy concerns of performing an evaluation and management service by phone and the availability of in person appointments. I also discussed with the patient that there may be a patient responsible charge related to this service. The patient expressed understanding and agreed to proceed.   History of Present Illness: She had frequent headaches in January, almost daily basis, had a sleep study at the end of January 2020, was diagnosed with severe obstructive sleep apnea, since starting CPAP machine, her headache has much improved, only had a couple headaches in February and March, responding well to Imitrex tablets.  Has been taking Effexor XR 75 mg every night  But recent few weeks, she complains of increased daytime fatigue, sleepiness again, had a repeat home sleep study/oxygen saturation test recently, results pending   Observations/Objective: I have reviewed problem lists, medications, allergies.  Sleep study on February 14, 2018, consistent with severe obstructive sleep apnea, fully compliant with AutoPap therapy,  Assessment and Plan: Obstructive sleep apnea  Continue follow-up with sleep specialist, has been compliant with her AutoPap Chronic migraine headaches  Continue Effexor XR 75 mg every night as preventive medication, which works out well for her migraine headaches, and mild mood disorder, she takes it every night, less likely account for her recent increased fatigue, daytime sleepiness,  Imitrex as needed  Follow Up Instructions:   6 months   I discussed the assessment and treatment plan with the patient. The patient was provided an opportunity to ask questions and all were answered. The patient agreed with the plan and demonstrated an understanding of the instructions.   The patient was advised to call back or seek an in-person evaluation if the  symptoms worsen or if the condition fails to improve as anticipated.  I provided 15 minutes of non-face-to-face time during this encounter.   Levert Feinstein, MD

## 2018-04-23 NOTE — Telephone Encounter (Signed)
Received ONO on auto pap results. Date for the test is from 12/17/2007. I will ask [facility] to fix this date.

## 2018-04-24 NOTE — Telephone Encounter (Signed)
I reviewed her overnight pulse oximetry test results from 04/18/2018, patient was on her AutoPap on room air for a duration of 6 hours and 39 minutes, average oxygen saturation was 93%, nadir was 86% for the night with time below or at 88% saturation for less than 1 minute, namely 48 seconds. Please advise patient that her oxygen saturations are adequate while she is on AutoPap therapy. Please encourage her to continue to be fully compliant with her AutoPap machine and follow-up routinely in June as previously scheduled. Please remind her to make enough time for sleep, 7-8 hours are recommended.

## 2018-04-24 NOTE — Telephone Encounter (Signed)
Received corrected dates on ONO on auto pap results. Will give to Dr. Frances Furbish for review.

## 2018-04-24 NOTE — Telephone Encounter (Signed)
I called pt and discussed her ONO on auto pap results. Pt is agreeable to keeping her follow up in June. I reminded pt to make sure she gets 7-8 hours of sleep. Pt verbalized understanding of results. Pt had no questions at this time but was encouraged to call back if questions arise.

## 2018-05-08 ENCOUNTER — Ambulatory Visit: Payer: Self-pay | Admitting: Neurology

## 2018-06-21 ENCOUNTER — Telehealth: Payer: Self-pay | Admitting: Neurology

## 2018-06-21 NOTE — Telephone Encounter (Signed)
Due to current COVID 19 pandemic, our office is severely reducing in office visits until further notice, in order to minimize the risk to our patients and healthcare providers.   Called patient and confirmed a virtual visit for her 6/9 appointment. Patient verbalized understanding of the doxy.me process and I have sent her an e-mail with link and directions as well as my name and office number/hours for reference. Patient understands that she will receive a call from RN to update chart.  Pt understands that although there may be some limitations with this type of visit, we will take all precautions to reduce any security or privacy concerns.  Pt understands that this will be treated like an in office visit and we will file with pt's insurance, and there may be a patient responsible charge related to this service.

## 2018-06-25 NOTE — Telephone Encounter (Signed)
I called pt. Pt's meds, allergies, and PMH were updated.  Pt reports that her cpap is going well.  Pt understands the doxy.me process.

## 2018-06-26 ENCOUNTER — Ambulatory Visit: Payer: 59 | Admitting: Neurology

## 2018-06-26 NOTE — Telephone Encounter (Signed)
Noted, thanks!

## 2018-06-26 NOTE — Telephone Encounter (Signed)
Pt called in to r/s appt due to having to take care of her mom, appt r/s for June 11 @ 3p

## 2018-06-27 ENCOUNTER — Ambulatory Visit: Payer: 59 | Admitting: Neurology

## 2018-06-28 ENCOUNTER — Ambulatory Visit: Payer: 59 | Admitting: Neurology

## 2018-06-28 ENCOUNTER — Other Ambulatory Visit: Payer: Self-pay

## 2018-06-28 ENCOUNTER — Telehealth: Payer: Self-pay

## 2018-06-28 NOTE — Telephone Encounter (Signed)
Pt did not show for their appt with Dr. Athar today.  

## 2018-09-07 HISTORY — DX: Morbid (severe) obesity due to excess calories: E66.01

## 2018-09-08 DIAGNOSIS — E119 Type 2 diabetes mellitus without complications: Secondary | ICD-10-CM

## 2018-09-08 HISTORY — DX: Type 2 diabetes mellitus without complications: E11.9

## 2018-09-10 ENCOUNTER — Telehealth: Payer: Self-pay

## 2018-09-10 NOTE — Telephone Encounter (Signed)
Sleep lab staff asked me to call this pt. She has left messages with them.  I called pt. She is complaining of fatigue and tiredness during the day, even while using cpap. She declined a virtual visit and wants the earliest in-office appt that we have this week. An appt was scheduled for 09/11/18 at 7:30 am with Amy, NP. Pt verbalized understanding of new appt date and time.

## 2018-09-11 ENCOUNTER — Encounter: Payer: Self-pay | Admitting: Family Medicine

## 2018-09-11 ENCOUNTER — Other Ambulatory Visit: Payer: Self-pay

## 2018-09-11 ENCOUNTER — Ambulatory Visit (INDEPENDENT_AMBULATORY_CARE_PROVIDER_SITE_OTHER): Payer: 59 | Admitting: Family Medicine

## 2018-09-11 VITALS — BP 153/85 | HR 84 | Ht 65.0 in | Wt 243.0 lb

## 2018-09-11 DIAGNOSIS — G4730 Sleep apnea, unspecified: Secondary | ICD-10-CM

## 2018-09-11 DIAGNOSIS — E669 Obesity, unspecified: Secondary | ICD-10-CM | POA: Diagnosis not present

## 2018-09-11 DIAGNOSIS — G4733 Obstructive sleep apnea (adult) (pediatric): Secondary | ICD-10-CM

## 2018-09-11 DIAGNOSIS — G4719 Other hypersomnia: Secondary | ICD-10-CM

## 2018-09-11 DIAGNOSIS — G4710 Hypersomnia, unspecified: Secondary | ICD-10-CM | POA: Diagnosis not present

## 2018-09-11 DIAGNOSIS — Z9989 Dependence on other enabling machines and devices: Secondary | ICD-10-CM

## 2018-09-11 NOTE — Patient Instructions (Signed)
Please work on healthy lifestyle changes with low glycemic diet and regular exercise  Consider narcolepsy sleep study  Follow up in 2-3 months   Sleep Apnea Sleep apnea affects breathing during sleep. It causes breathing to stop for a short time or to become shallow. It can also increase the risk of:  Heart attack.  Stroke.  Being very overweight (obese).  Diabetes.  Heart failure.  Irregular heartbeat. The goal of treatment is to help you breathe normally again. What are the causes? There are three kinds of sleep apnea:  Obstructive sleep apnea. This is caused by a blocked or collapsed airway.  Central sleep apnea. This happens when the brain does not send the right signals to the muscles that control breathing.  Mixed sleep apnea. This is a combination of obstructive and central sleep apnea. The most common cause of this condition is a collapsed or blocked airway. This can happen if:  Your throat muscles are too relaxed.  Your tongue and tonsils are too large.  You are overweight.  Your airway is too small. What increases the risk?  Being overweight.  Smoking.  Having a small airway.  Being older.  Being female.  Drinking alcohol.  Taking medicines to calm yourself (sedatives or tranquilizers).  Having family members with the condition. What are the signs or symptoms?  Trouble staying asleep.  Being sleepy or tired during the day.  Getting angry a lot.  Loud snoring.  Headaches in the morning.  Not being able to focus your mind (concentrate).  Forgetting things.  Less interest in sex.  Mood swings.  Personality changes.  Feelings of sadness (depression).  Waking up a lot during the night to pee (urinate).  Dry mouth.  Sore throat. How is this diagnosed?  Your medical history.  A physical exam.  A test that is done when you are sleeping (sleep study). The test is most often done in a sleep lab but may also be done at home.  How is this treated?   Sleeping on your side.  Using a medicine to get rid of mucus in your nose (decongestant).  Avoiding the use of alcohol, medicines to help you relax, or certain pain medicines (narcotics).  Losing weight, if needed.  Changing your diet.  Not smoking.  Using a machine to open your airway while you sleep, such as: ? An oral appliance. This is a mouthpiece that shifts your lower jaw forward. ? A CPAP device. This device blows air through a mask when you breathe out (exhale). ? An EPAP device. This has valves that you put in each nostril. ? A BPAP device. This device blows air through a mask when you breathe in (inhale) and breathe out.  Having surgery if other treatments do not work. It is important to get treatment for sleep apnea. Without treatment, it can lead to:  High blood pressure.  Coronary artery disease.  In men, not being able to have an erection (impotence).  Reduced thinking ability. Follow these instructions at home: Lifestyle  Make changes that your doctor recommends.  Eat a healthy diet.  Lose weight if needed.  Avoid alcohol, medicines to help you relax, and some pain medicines.  Do not use any products that contain nicotine or tobacco, such as cigarettes, e-cigarettes, and chewing tobacco. If you need help quitting, ask your doctor. General instructions  Take over-the-counter and prescription medicines only as told by your doctor.  If you were given a machine to use while you  sleep, use it only as told by your doctor.  If you are having surgery, make sure to tell your doctor you have sleep apnea. You may need to bring your device with you.  Keep all follow-up visits as told by your doctor. This is important. Contact a doctor if:  The machine that you were given to use during sleep bothers you or does not seem to be working.  You do not get better.  You get worse. Get help right away if:  Your chest hurts.  You have  trouble breathing in enough air.  You have an uncomfortable feeling in your back, arms, or stomach.  You have trouble talking.  One side of your body feels weak.  A part of your face is hanging down. These symptoms may be an emergency. Do not wait to see if the symptoms will go away. Get medical help right away. Call your local emergency services (911 in the [place] Do not drive yourself to the hospital. Summary  This condition affects breathing during sleep.  The most common cause is a collapsed or blocked airway.  The goal of treatment is to help you breathe normally while you sleep. This information is not intended to replace advice given to you by your health care provider. Make sure you discuss any questions you have with your health care provider. Document Released: 10/13/2007 Document Revised: 10/20/2017 Document Reviewed: 08/29/2017 Elsevier Patient Education  [facility].   Fatigue If you have fatigue, you feel tired all the time and have a lack of energy or a lack of motivation. Fatigue may make it difficult to start or complete tasks because of exhaustion. In general, occasional or mild fatigue is often a normal response to activity or life. However, long-lasting (chronic) or extreme fatigue may be a symptom of a medical condition. Follow these instructions at home: General instructions  Watch your fatigue for any changes.  Go to bed and get up at the same time every day.  Avoid fatigue by pacing yourself during the day and getting enough sleep at night.  Maintain a healthy weight. Medicines  Take over-the-counter and prescription medicines only as told by your health care provider.  Take a multivitamin, if told by your health care provider.  Do not use herbal or dietary supplements unless they are approved by your health care provider. Activity   Exercise regularly, as told by your health care provider.  Use or practice techniques to help you relax,  such as yoga, tai chi, meditation, or massage therapy. Eating and drinking   Avoid heavy meals in the evening.  Eat a well-balanced diet, which includes lean proteins, whole grains, plenty of fruits and vegetables, and low-fat dairy products.  Avoid consuming too much caffeine.  Avoid the use of alcohol.  Drink enough fluid to keep your urine pale yellow. Lifestyle  Change situations that cause you stress. Try to keep your work and personal schedule in balance.  Do not use any products that contain nicotine or tobacco, such as cigarettes and e-cigarettes. If you need help quitting, ask your health care provider.  Do not use drugs. Contact a health care provider if:  Your fatigue does not get better.  You have a fever.  You suddenly lose or gain weight.  You have headaches.  You have trouble falling asleep or sleeping through the night.  You feel angry, guilty, anxious, or sad.  You are unable to have a bowel movement (constipation).  Your skin is  dry.  You have swelling in your legs or another part of your body. Get help right away if:  You feel confused.  Your vision is blurry.  You feel faint or you pass out.  You have a severe headache.  You have severe pain in your abdomen, your back, or the area between your waist and hips (pelvis).  You have chest pain, shortness of breath, or an irregular or fast heartbeat.  You are unable to urinate, or you urinate less than normal.  You have abnormal bleeding, such as bleeding from the rectum, vagina, nose, lungs, or nipples.  You vomit blood.  You have thoughts about hurting yourself or others. If you ever feel like you may hurt yourself or others, or have thoughts about taking your own life, get help right away. You can go to your nearest emergency department or call:  Your local emergency services (911 in the [place]  A suicide crisis helpline, such as the [facility] at  623-518-3005. This is open 24 hours a day. Summary  If you have fatigue, you feel tired all the time and have a lack of energy or a lack of motivation.  Fatigue may make it difficult to start or complete tasks because of exhaustion.  Long-lasting (chronic) or extreme fatigue may be a symptom of a medical condition.  Exercise regularly, as told by your health care provider.  Change situations that cause you stress. Try to keep your work and personal schedule in balance. This information is not intended to replace advice given to you by your health care provider. Make sure you discuss any questions you have with your health care provider. Document Released: 10/31/2006 Document Revised: 04/26/2018 Document Reviewed: 09/28/2016 Elsevier Patient Education  2020 [employer].

## 2018-09-11 NOTE — Progress Notes (Addendum)
PATIENT: [name] DOB: November 08, 1960  REASON FOR VISIT: follow up HISTORY FROM: patient  Chief Complaint  Patient presents with   Follow-up    [facility] Room, alone. Still feeling tired/fatigued during day, even with cpap.     HISTORY OF PRESENT ILLNESS: Today 09/11/18 [name] is a 58 y.o. female here today for follow up OSA on CPAP. She continues to have concerns of excessive daytime sleepiness and fatigue. She is compliant with therapy. She feels that she was feeling better until about 2 weeks ago. She states that she has fallen asleep twice at work. She has never done this before. She reports that once, she was dreaming. She is caring for a dog with seizures. She is up and down throughout the night. She continues Effexor 74m for migraine prevention. She is taking magnesium with Melatonin at night as well. She does not exercise. She was recently started on metformin 1005mtwice daily. 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 She has gained about 30 pounds in the past year. She is working with [facility] to help with weight management. She is also caring for her elderly mother.   Compliance report dated 08/12/2018 through 09/10/2018 reveals that she is using CPAP nightly and greater than 4 hours each night for compliance of 100%.  Average usage is 6 hours and 56 minutes.  AHI was 0.5 on 7 to 13 cm of water and an EPR of 1.  There was no significant leak.    HISTORY: (copied from Dr AtGuadelupe Sabinote on 03/26/2018)  Interim history:   [name] a [age]ear old right-handed woman with an underlying medical history of migraine headaches, hyperlipidemia, factor V deficiency, depression, anxiety, history of DVT, history of varicose veins, and obesity, who presents for follow-up consultation of her obstructive sleep apnea, after a recent onset sleep testing and starting AutoPap therapy. The patient is unaccompanied today. I first met her on  01/22/2018 at the request of Dr. YaKrista Blueat which time she reported snoring and daytime somnolence as well as witnessed apneas. Her Epworth sleepiness score was 17 at the time.  Today, 03/26/2018: I reviewed her AutoPap compliance data from 02/24/2018 through 03/25/2018 which is a total of 30 days, during which time she used her AutoPap every night with percent used days greater than 4 hours at 100%, indicating superb compliance with an average usage of 6 hours and 46 minutes, residual AHI at goal at 0.5 per hour, 95th percentile pressure at 11.7 cm, leak on the high side with the 95th percentile at 19.8 L/m on a pressure range of 7 cm to 13 cm. She reports that she is still sleepy. She even feels that she is more sleepy as she has started using the AutoPap. Her Epworth sleepiness score is 19 out of 24 today. Looking back at her medication history, she restarted Effexor long-acting in September 2019, she had stopped it in or around May 2018. When she saw Dr. YaKrista Bluen October 2019 the Effexor was increased to 75 mg daily. She takes it at night. She does admit that it could be correlated. She also has a dog that has to be up early, she tries to keep a scheduled for this. She tries to keep a schedule on the weekends 2. She does report that her headaches are better all month of February since she has been consistent with her AutoPap. In fact, her headaches are much improved.  Previously, she had been on Topamax which caused side effects and she also tried talks from migraines. She is motivated to continue with treatment. She has gained weight over time. She has a more sedentary job for the past year.   The patient's allergies, current medications, family history, past medical history, past social history, past surgical history and problem list were reviewed and updated as appropriate.   Previously:   01/22/2018: She reports snoring and excessive daytime somnolence. I reviewed your office note from 10/19/2017.  She has had recurrent headaches. Her Epworth sleepiness score is 17 out of 24, fatigue score is 41 out of 63. She is divorced and lives alone, no children. She is a nonsmoker and drinks alcohol infrequently, caffeine not daily, 1-2 cups 3 or 4 times a week on average. She has a 40 minute commute and has become sleepy while driving. She works as an Lawyer. She tries to be in bed before 10 and arise time is currently around 5. She has a dog that needs to get out around that time. She has a TV in her bedroom, typically on a sleep timer, she has had some morning headaches. She has nocturia about once per average night, is not aware of any family history of OSA. She gained weight starting in 2006.Her sister has noted her loud snoring but also pauses in her breathing and patient has woken herself up with a sense of gasping for air at times.   REVIEW OF SYSTEMS: Out of a complete 14 system review of symptoms, the patient complains only of the following symptoms, headaches, fatigue and all other reviewed systems are negative.   ALLERGIES: Allergies  Allergen Reactions   Statins Other (See Comments)    Pt states she has severe joint and body aches.   Codeine Hives    rash    HOME MEDICATIONS: Outpatient Medications Prior to Visit  Medication Sig Dispense Refill   aspirin 325 MG tablet Take 325 mg by mouth 2 (two) times daily.     Biotin 10000 MCG TABS Take by mouth.     Cholecalciferol (VITAMIN D3 PO) Take 5,000 Units by mouth daily.     cimetidine (TAGAMET) 200 MG tablet Take 200 mg by mouth daily as needed.     Garlic 6734 MG CAPS Take by mouth.     ibuprofen (ADVIL,MOTRIN) 200 MG tablet Take 400 mg by mouth every 6 (six) hours as needed for mild pain.     KRILL OIL PO Take 750 mg by mouth daily.     Magnesium 250 MG TABS Take 250 mg by mouth 2 (two) times daily.     MAGNESIUM PO Take 250 mg by mouth daily.     metFORMIN (GLUCOPHAGE) 500 MG tablet Take by mouth 2 (two)  times daily with a meal.     SUMAtriptan (IMITREX STATDOSE SYSTEM) 6 MG/0.5ML SOAJ Inject 6 mg into the skin as needed. 6 Syringe 11   SUMAtriptan (IMITREX) 100 MG tablet Take 1 tab at onset of migraine.  May repeat in 2 hrs, if needed.  Max dose: 2 tabs/day. This is a 90 day prescription. 27 tablet 4   venlafaxine XR (EFFEXOR-XR) 75 MG 24 hr capsule Take 1 capsule (75 mg total) by mouth daily with breakfast. 90 capsule 4   frovatriptan (FROVA) 2.5 MG tablet Take 2.5 mg by mouth as needed for migraine. If recurs, may repeat after 2 hours. Max of 3 tabs in 24 hours.     No  facility-administered medications prior to visit.     PAST MEDICAL HISTORY: Past Medical History:  Diagnosis Date   Allergy    Anxiety    Blood clot in vein    right leg   Chest tightness    Depression    Factor V deficiency (HCC)    Hyperlipidemia    Migraine    Morbid obesity with BMI of 40.0-44.9, adult (HCC)    Varicose vein     PAST SURGICAL HISTORY: Past Surgical History:  Procedure Laterality Date   TUBAL LIGATION  2006   VEIN LIGATION Bilateral     FAMILY HISTORY: Family History  Problem Relation Age of Onset   Diabetes Mother 77       type 2   Diabetes Father 12       type 2   Dementia Father 10   Hyperlipidemia Father    Diabetes Sister    Hyperlipidemia Sister    Hypertension Sister    Diabetes Brother    Heart disease Brother    Hyperlipidemia Brother    Hypertension Brother    Breast cancer Sister     SOCIAL HISTORY: Social History   Socioeconomic History   Marital status: Divorced    Spouse name: Not on file   Number of children: 0   Years of education: 12   Highest education level: Not on file  Occupational History   Occupation: Higher education careers adviser: [place] resource strain: Not on file   Food insecurity    Worry: Not on file    Inability: Not on file   Transportation needs     Medical: Not on file    Non-medical: Not on file  Tobacco Use   Smoking status: Never Smoker   Smokeless tobacco: Never Used  Substance and Sexual Activity   Alcohol use: Yes    Alcohol/week: 1.0 standard drinks    Types: 1 Glasses of wine per week    Comment: OCC   Drug use: No   Sexual activity: Not on file  Lifestyle   Physical activity    Days per week: Not on file    Minutes per session: Not on file   Stress: Not on file  Relationships   Social connections    Talks on phone: Not on file    Gets together: Not on file    Attends religious service: Not on file    Active member of club or organization: Not on file    Attends meetings of clubs or organizations: Not on file    Relationship status: Not on file   Intimate partner violence    Fear of current or ex partner: Not on file    Emotionally abused: Not on file    Physically abused: Not on file    Forced sexual activity: Not on file  Other Topics Concern   Not on file  Social History Narrative   Patient is divorced. Patient has a high school education and works for [employer].    Right handed.   Caffeine- two daily               PHYSICAL EXAM  Vitals:   09/11/18 0737  BP: (!) 153/85  Pulse: 84  Weight: 243 lb (110.2 kg)  Height: _0  (1.651 m)   Body mass index is 40.44 kg/m.  Generalized: Well developed, in no acute distress  Cardiology: normal rate and rhythm, no murmur  noted Respiratory: Clear to auscultation bilaterally Mallampati: 4+, neck circ 15.5" Neurological examination  Mentation: Alert oriented to time, place, history taking. Follows all commands speech and language fluent Cranial nerve II-XII: Pupils were equal round reactive to light. Extraocular movements were full, visual field were full on confrontational test. Facial sensation and strength were normal. Uvula tongue midline. Head turning and shoulder shrug  were normal and symmetric. Motor: The motor testing reveals 5  over 5 strength of all 4 extremities. Good symmetric motor tone is noted throughout.  Coordination: Cerebellar testing reveals good finger-nose-finger and heel-to-shin bilaterally.  Gait and station: Gait is normal.   DIAGNOSTIC DATA (LABS, IMAGING, TESTING) - I reviewed patient records, labs, notes, testing and imaging myself where available.  No flowsheet data found.   Lab Results  Component Value Date   WBC 6.9 01/22/2015   HGB 13.7 01/22/2015   HCT 40.6 01/22/2015   MCV 86.8 01/22/2015   PLT 225 01/13/2015      Component Value Date/Time   NA 141 01/13/2015 0853   K 4.3 01/13/2015 0853   CL 105 12/25/2012 0814   CO2 25 01/13/2015 0853   GLUCOSE 130 01/13/2015 0853   BUN 14.3 01/13/2015 0853   CREATININE 0.9 01/13/2015 0853   CALCIUM 9.5 01/13/2015 0853   PROT 7.4 01/13/2015 0853   ALBUMIN 3.7 01/13/2015 0853   AST 12 01/13/2015 0853   ALT 14 01/13/2015 0853   ALKPHOS 102 01/13/2015 0853   BILITOT 0.48 01/13/2015 0853   GFRNONAA 83 12/25/2012 0814   GFRAA >89 12/25/2012 0814   Lab Results  Component Value Date   CHOL 217 (H) 12/25/2012   HDL 52 12/25/2012   LDLCALC 140 (H) 12/25/2012   TRIG 126 12/25/2012   CHOLHDL 4.2 12/25/2012   Lab Results  Component Value Date   HGBA1C 5.8 07/09/2015   Lab Results  Component Value Date   VITAMINB12 440 01/13/2015   Lab Results  Component Value Date   TSH 3.140 12/25/2012       ASSESSMENT AND PLAN 58 y.o. year old female  has a past medical history of Allergy, Anxiety, Blood clot in vein, Chest tightness, Depression, Factor V deficiency ([facility]), Hyperlipidemia, Migraine, Morbid obesity with BMI of 40.0-44.9, adult ([place]), and Varicose vein. here with   No diagnosis found.   I have discussed many factors associated with fatigue and excessive daytime sleepiness with [name].  I am concerned that multiple factors are playing a role in her fatigue.  She was recently diagnosed with prediabetes.  She is up and down at  night with a special needs dog.  She is caring for her elderly mother.  She is not as active as she used to be.  She has a sedentary lifestyle.  She is recently gained 30 pounds.  We have discussed testing for narcolepsy.  I do not feel that this is necessary at this time.  She is also hesitant to wean Effexor as it is helping significantly with her headaches.  We have decided that she will focus on lifestyle changes over the next 8 to 12 weeks.  She will continue CPAP therapy nightly and for greater than 4 hours each night.  I have advised regular mask and supply changes.  She was encouraged to sign up on my chart to update me with progress and any concerns that may arise.  She will follow-up with me in 2 to 3 months to assess response to the lifestyle changes.  We may  consider blood setting at that time if symptoms continue.  She verbalizes understanding and agreement with this plan.  No orders of the defined types were placed in this encounter.    No orders of the defined types were placed in this encounter.     I spent 15 minutes with the patient. 50% of this time was spent counseling and educating patient on plan of care and medications.    Debbora Presto, FNP-C 09/11/2018, 8:45 AM [facility] [address] [place], [place] [address] [phone]  I reviewed the above note and documentation by the Nurse Practitioner and agree with the history, physical exam, assessment and plan as outlined above. I was immediately available for consultation. Star Age, MD, PhD [facility])

## 2018-10-22 ENCOUNTER — Ambulatory Visit: Payer: 59 | Admitting: Neurology

## 2018-10-25 ENCOUNTER — Encounter: Payer: Self-pay | Admitting: Neurology

## 2018-10-25 ENCOUNTER — Ambulatory Visit (INDEPENDENT_AMBULATORY_CARE_PROVIDER_SITE_OTHER): Payer: 59 | Admitting: Neurology

## 2018-10-25 ENCOUNTER — Other Ambulatory Visit: Payer: Self-pay

## 2018-10-25 ENCOUNTER — Telehealth: Payer: Self-pay | Admitting: *Deleted

## 2018-10-25 VITALS — BP 149/97 | HR 69 | Temp 97.4°F | Ht 65.0 in | Wt 241.0 lb

## 2018-10-25 DIAGNOSIS — IMO0002 Reserved for concepts with insufficient information to code with codable children: Secondary | ICD-10-CM

## 2018-10-25 DIAGNOSIS — G43709 Chronic migraine without aura, not intractable, without status migrainosus: Secondary | ICD-10-CM

## 2018-10-25 MED ORDER — SUMATRIPTAN SUCCINATE 100 MG PO TABS: ORAL_TABLET | ORAL | 4 refills | Status: DC

## 2018-10-25 MED ORDER — VENLAFAXINE HCL ER 75 MG PO CP24: 75.0000 mg | ORAL_CAPSULE | Freq: Every day | ORAL | 4 refills | Status: DC

## 2018-10-25 MED ORDER — SUMATRIPTAN SUCCINATE 6 MG/0.5ML ~~LOC~~ SOAJ: 6.0000 mg | SUBCUTANEOUS | 11 refills | Status: DC | PRN

## 2018-10-25 NOTE — Progress Notes (Signed)
PATIENT: [name] [facility] DOB: 1960/08/23  Chief Complaint  Patient presents with  . Migraine    rm 4,  6 month FU    HISTORICAL  JELISA Leetsdale is a 58 year old Caucasian female, came in for Botox injection for chronic migraine headaches.  HISTORY: She has a history of chronic migraine headaches for a long time, increased since 2008, her migraine usually start at one side, occipital region, spreading forward, throbbing severe headache, with smell and light and noise sensitivity,  lasting 2-3 days  About half times in a month, she would have some degree of headaches, out of those headaches, about 2-5 are severe long-lasting headache, lasting about 2-3 days,  over the past few years, she has tried different preventive medications, Topamax, cause confusion, difficulty talking, propanolol, she could not tolerate, and doesn't help, antidepression-excessive fatigue, no help.  She was able to identify trigger of her migraine, stress, menstruation period of time, sometimes woke up with severe headache,  she is currently taking Frova and Imitrex tablets, which helped her majority of the time, this is her first Botox injection as migraine prevention  She responded very well to last Botox injection, she began to notice increased frequency of headaches since June 2014, about once a week, at the maximum benefit to Botox, she only has one to 2 headaches each month, Frova has always been able to take care of her headache, but she usually tried Imitrex tablets first, 20% of time she has to take second tablet by the afternoon, she previously responded to Imitrex subcutaneous injection very well, she worried about losing benefit of Frova with repeat use.  UPDATE June 24th 2015: She is doing well with Imitrex injection, she still has some recurrent headaches in past 3 months, responding well with imitrex injections.  UPDATE Nov 19 2014: Last BOTOX injection was in June 2015.  She lost job in June 26 2014.  She is gong through a lot stress. She complains of increased headaches since October 2016, few times a week, has to use Imitrex injection sometimes repeat dosing a day, followed by Frova next day, does help her headaches,  Previously she has tried Topamax, complains of slow thinking, which does help her headaches   UPDATE Feb 04 2015: Last BOTOX injection was in June 2015, she complains of difficulty breathing, especially at night times, after BOTOX wearing off, she began to notice improvement. She had 6 imitrex injection in Jan 2017, which did help her headache.  She had slurred words, mild memory loss with topamax.  She still looks for a job.  UPDATE May 04 2015: She return for Botox injection as migraine prevention, she continue has frequent migraine headaches, require Imitrex injection,  UPDATE July 30 2015: She responded very well to previous Botox injection, she had 7 migraines over the past 3 months, she used Frova when she woke up with severe headache, Imitrex injection for moderate to severe headaches, both works well,  She has developed left thoracic shingle in June 2017, develop postherpetic neuralgia, has tried gabapentin 100 mg 2 tablets 3 times a day, which has made her drowsy, but did not help her neuropathic pain   Update October 28 2015:  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 [place], she chose to clinic for Botox injection as migraine prevention, she only had 11 headaches over past 3 months, responding well to Imitrex injection and Frova,  UPDATE Oct 19 2017: She has  lost to follow-up since 2017 due to insurance reasons, now she has headache about once or twice each month, but some headache last about to 8 days, she is taking magnesium oxide, melatonin to help her sleep, she also complains of excessive daytime sleepiness, nodded off during the day, drowsy while driving,  UPDATE Oct 25 2018: Her headache overall is under good control,  taking Effexor 75 mg daily, Imitrex as needed   REVIEW OF SYSTEMS: Full 14 system review of systems performed and notable only for as above All other review of systems were negative.  ALLERGIES: Allergies  Allergen Reactions  . Statins Other (See Comments)    Pt states she has severe joint and body aches.  . Codeine Hives    rash    HOME MEDICATIONS: Current Outpatient Medications  Medication Sig Dispense Refill  . aspirin 325 MG tablet Take 325 mg by mouth 2 (two) times daily.    . Biotin 10000 MCG TABS Take by mouth.    . Cholecalciferol (VITAMIN D3 PO) Take 5,000 Units by mouth daily.    . cimetidine (TAGAMET) 200 MG tablet Take 200 mg by mouth daily as needed.    . Garlic 1027 MG CAPS Take by mouth.    Marland Kitchen ibuprofen (ADVIL,MOTRIN) 200 MG tablet Take 400 mg by mouth every 6 (six) hours as needed for mild pain.    Marland Kitchen KRILL OIL PO Take 750 mg by mouth daily.    Marland Kitchen MAGNESIUM PO Take 250 mg by mouth daily.    . metFORMIN (GLUCOPHAGE) 500 MG tablet Take by mouth 2 (two) times daily with a meal.    . SUMAtriptan (IMITREX STATDOSE SYSTEM) 6 MG/0.5ML SOAJ Inject 6 mg into the skin as needed. 5 mL 11  . SUMAtriptan (IMITREX) 100 MG tablet Take 1 tab at onset of migraine.  May repeat in 2 hrs, if needed.  Max dose: 2 tabs/day. This is a 90 day prescription. 27 tablet 4  . venlafaxine XR (EFFEXOR-XR) 75 MG 24 hr capsule Take 1 capsule (75 mg total) by mouth daily with breakfast. 90 capsule 4   No current facility-administered medications for this visit.     PAST MEDICAL HISTORY: Past Medical History:  Diagnosis Date  . Allergy   . Anxiety   . Blood clot in vein    right leg  . Chest tightness   . Depression   . Factor V deficiency ([place])   . Hyperlipidemia   . Migraine   . Morbid obesity with BMI of 40.0-44.9, adult ([place])   . Varicose vein     PAST SURGICAL HISTORY: Past Surgical History:  Procedure Laterality Date  . TUBAL LIGATION  2006  . VEIN LIGATION Bilateral      FAMILY HISTORY: Family History  Problem Relation Age of Onset  . Diabetes Mother 16       type 2  . Diabetes Father 38       type 2  . Dementia Father 19  . Hyperlipidemia Father   . Diabetes Sister   . Hyperlipidemia Sister   . Hypertension Sister   . Diabetes Brother   . Heart disease Brother   . Hyperlipidemia Brother   . Hypertension Brother   . Breast cancer Sister     SOCIAL HISTORY: Social History   Socioeconomic History  . Marital status: Divorced    Spouse name: Not on file  . Number of children: 0  . Years of education: 36  . Highest education level: Not  on file  Occupational History  . Occupation: Engineering geologistglobal quality director    Employer: [employer]  Social Needs  . Financial resource strain: Not on file  . Food insecurity    Worry: Not on file    Inability: Not on file  . Transportation needs    Medical: Not on file    Non-medical: Not on file  Tobacco Use  . Smoking status: Never Smoker  . Smokeless tobacco: Never Used  Substance and Sexual Activity  . Alcohol use: Yes    Alcohol/week: 1.0 standard drinks    Types: 1 Glasses of wine per week    Comment: OCC  . Drug use: No  . Sexual activity: Not on file  Lifestyle  . Physical activity    Days per week: Not on file    Minutes per session: Not on file  . Stress: Not on file  Relationships  . Social Musicianconnections    Talks on phone: Not on file    Gets together: Not on file    Attends religious service: Not on file    Active member of club or organization: Not on file    Attends meetings of clubs or organizations: Not on file    Relationship status: Not on file  . Intimate partner violence    Fear of current or ex partner: Not on file    Emotionally abused: Not on file    Physically abused: Not on file    Forced sexual activity: Not on file  Other Topics Concern  . Not on file  Social History Narrative   Patient is divorced. Patient has a high school education and works for [employer].    Right handed.   Caffeine- two daily              PHYSICAL EXAM   Vitals:   10/25/18 0738  BP: (!) 149/97  Pulse: 69  Temp: (!) 97.4 F (36.3 C)  Weight: 241 lb (109.3 kg)  Height: 5\' 5"  (1.651 m)    Not recorded      Body mass index is 40.1 kg/m.  PHYSICAL EXAMNIATION:  Gen: NAD, conversant, well nourised, well groomed                     Cardiovascular: Regular rate rhythm, no peripheral edema, warm, nontender. Eyes: Conjunctivae clear without exudates or hemorrhage Neck: Supple, no carotid bruits. Pulmonary: Clear to auscultation bilaterally   NEUROLOGICAL EXAM:  MENTAL STATUS: Speech:    Speech is normal; fluent and spontaneous with normal comprehension.  Cognition:     Orientation to time, place and person     Normal recent and remote memory     Normal Attention span and concentration     Normal Language, naming, repeating,spontaneous speech     Fund of knowledge   CRANIAL NERVES: CN II: Visual fields are full to confrontation.  Pupils are round equal and briskly reactive to light. CN III, IV, VI: extraocular movement are normal. No ptosis. CN V: Facial sensation is intact to pinprick in all 3 divisions bilaterally. Corneal responses are intact.  CN VII: Face is symmetric with normal eye closure and smile. CN VIII: Hearing is normal to causal conversation. CN IX, X: Palate elevates symmetrically. Phonation is normal. CN XI: Head turning and shoulder shrug are intact CN XII: Tongue is midline with normal movements and no atrophy.  MOTOR: There is no pronator drift of out-stretched arms. Muscle bulk and tone are  normal. Muscle strength is normal.  REFLEXES: Reflexes are 2+ and symmetric at the biceps, triceps, knees, and ankles. Plantar responses are flexor.  SENSORY: Intact to light touch, pinprick, positional sensation and vibratory sensation are intact in fingers and toes.  COORDINATION: Rapid alternating movements and fine finger  movements are intact. There is no dysmetria on finger-to-nose and heel-knee-shin.    GAIT/STANCE: Posture is normal. Gait is steady with normal steps, base, arm swing, and turning. Heel and toe walking are normal. Tandem gait is normal.  Romberg is absent.   DIAGNOSTIC DATA (LABS, IMAGING, TESTING) - I reviewed patient records, labs, notes, testing and imaging myself where available.   ASSESSMENT AND PLAN  ARRION BURRUEL is a 58 y.o. female   Chronic Migraine  Continue Effexor 75 mg daily  Imitrex as needed    Levert Feinstein, M.D. Ph.D.  [facility] [address], [address] [place], [place] [address] Ph: [phone] Fax: [phone]  CC: Referring Provider

## 2018-10-25 NOTE — Telephone Encounter (Signed)
Submitted PA sumatriptan 6mg /0.20ml on CMM. Key: [id number]. Waiting on determination from [facility].

## 2018-10-29 NOTE — Telephone Encounter (Signed)
Received response from [facility] that sumatriptan does not require a PA.  Also, medication quantities above the plan limit are excluded under the plan.  The patient is prescribed both sumatriptan injections and tablets.  She will have to alternate filling these prescription in order to have insurance coverage.  Pharmacy has been notified.

## 2018-11-07 ENCOUNTER — Ambulatory Visit (INDEPENDENT_AMBULATORY_CARE_PROVIDER_SITE_OTHER): Payer: 59

## 2018-11-07 ENCOUNTER — Other Ambulatory Visit: Payer: Self-pay

## 2018-11-07 ENCOUNTER — Encounter: Payer: Self-pay | Admitting: Podiatry

## 2018-11-07 ENCOUNTER — Ambulatory Visit: Payer: 59 | Admitting: Podiatry

## 2018-11-07 ENCOUNTER — Other Ambulatory Visit: Payer: Self-pay | Admitting: Podiatry

## 2018-11-07 VITALS — BP 128/81 | HR 87 | Resp 16

## 2018-11-07 DIAGNOSIS — M25571 Pain in right ankle and joints of right foot: Secondary | ICD-10-CM

## 2018-11-07 DIAGNOSIS — M76821 Posterior tibial tendinitis, right leg: Secondary | ICD-10-CM | POA: Diagnosis not present

## 2018-11-07 MED ORDER — DICLOFENAC SODIUM 75 MG PO TBEC: 75.0000 mg | DELAYED_RELEASE_TABLET | Freq: Two times a day (BID) | ORAL | 2 refills | Status: DC

## 2018-11-07 NOTE — Progress Notes (Signed)
Subjective:   Patient ID: [name], female   DOB: 58 y.o.   MRN: [id number]   HPI Patient presents with pain on the inside of the right ankle stating that it has been hurting her for at least 6 weeks and she does not remember injury and she has been on a steroid pack but only took it short-term and it did not help and it sore for her during any form of activity.  Patient does not smoke likes to be active   Review of Systems  All other systems reviewed and are negative.       Objective:  Physical Exam Vitals signs and nursing note reviewed.  Constitutional:      Appearance: She is well-developed.  Pulmonary:     Effort: Pulmonary effort is normal.  Musculoskeletal: Normal range of motion.  Skin:    General: Skin is warm.  Neurological:     Mental Status: She is alert.     Neurovascular status intact muscle strength found to be adequate range of motion within normal limits with patient found to have inflammation of the posterior tibial tendon as it comes underneath the malleolus in certain subicular right with fluid buildup.  I checked muscle strength found to be adequate around the posterior tib tendon and I did note mild to moderate edema in the area     Assessment:  Posterior tibial tendinitis right with possibility for interstitial tear of symptoms do not reduce     Plan:  H&P condition reviewed and careful sheath injection administered 3 mg Kenalog 5 mg Xylocaine and applied fascial brace to lift up the arch.  Placed on diclofenac 75 mg twice daily discussed supportive shoes and reappoint in the next several weeks to discuss possible cast immobilization orthotics or MRI  X-rays did indicate a well formed arch with no indications of collapse currently

## 2018-11-07 NOTE — Progress Notes (Signed)
   Subjective:    Patient ID: [name], female    DOB: [birth year], 59 y.o.   MRN: [id number]  HPI    Review of Systems  All other systems reviewed and are negative.      Objective:   Physical Exam        Assessment & Plan:

## 2018-11-21 ENCOUNTER — Other Ambulatory Visit: Payer: Self-pay

## 2018-11-21 ENCOUNTER — Ambulatory Visit (INDEPENDENT_AMBULATORY_CARE_PROVIDER_SITE_OTHER): Payer: 59 | Admitting: Podiatry

## 2018-11-21 ENCOUNTER — Encounter: Payer: Self-pay | Admitting: Podiatry

## 2018-11-21 DIAGNOSIS — M76821 Posterior tibial tendinitis, right leg: Secondary | ICD-10-CM

## 2018-11-21 DIAGNOSIS — M25571 Pain in right ankle and joints of right foot: Secondary | ICD-10-CM | POA: Diagnosis not present

## 2018-11-21 NOTE — Progress Notes (Signed)
Subjective:   Patient ID: [name], female   DOB: 58 y.o.   MRN: [id number]   HPI Patient presents stating that the inside of the ankle is improved by about 80% and she has not fully tested it yet but feels better   ROS      Objective:  Physical Exam  Neurovascular status intact with posterior tibial tendinitis right that is improving with moderate discomfort still noted upon palpation     Assessment:  Improving posterior tibial tendinitis with probable mechanical dysfunction     Plan:  H&P reviewed condition and recommended long-term support therapy anti-inflammatories with possibility of orthotics if symptoms were to persist or get worse.  Patient will be seen back to recheck

## 2019-01-03 ENCOUNTER — Emergency Department (HOSPITAL_BASED_OUTPATIENT_CLINIC_OR_DEPARTMENT_OTHER)
Admission: EM | Admit: 2019-01-03 | Discharge: 2019-01-03 | Disposition: A | Discharge status: Home / Self Care | Payer: 59 | Attending: Emergency Medicine | Admitting: Emergency Medicine

## 2019-01-03 ENCOUNTER — Encounter: Payer: Self-pay | Admitting: Cardiology

## 2019-01-03 ENCOUNTER — Other Ambulatory Visit: Payer: Self-pay

## 2019-01-03 ENCOUNTER — Emergency Department (HOSPITAL_BASED_OUTPATIENT_CLINIC_OR_DEPARTMENT_OTHER): Payer: 59

## 2019-01-03 ENCOUNTER — Encounter (HOSPITAL_BASED_OUTPATIENT_CLINIC_OR_DEPARTMENT_OTHER): Payer: Self-pay | Admitting: *Deleted

## 2019-01-03 ENCOUNTER — Ambulatory Visit (INDEPENDENT_AMBULATORY_CARE_PROVIDER_SITE_OTHER): Payer: 59 | Admitting: Cardiology

## 2019-01-03 VITALS — BP 130/90 | HR 86 | Ht 60.0 in | Wt 249.0 lb

## 2019-01-03 DIAGNOSIS — R000 Tachycardia, unspecified: Secondary | ICD-10-CM | POA: Diagnosis present

## 2019-01-03 DIAGNOSIS — D6851 Activated protein C resistance: Secondary | ICD-10-CM | POA: Diagnosis not present

## 2019-01-03 DIAGNOSIS — I471 Supraventricular tachycardia: Secondary | ICD-10-CM | POA: Insufficient documentation

## 2019-01-03 DIAGNOSIS — R5383 Other fatigue: Secondary | ICD-10-CM | POA: Diagnosis not present

## 2019-01-03 DIAGNOSIS — G43719 Chronic migraine without aura, intractable, without status migrainosus: Secondary | ICD-10-CM | POA: Diagnosis not present

## 2019-01-03 DIAGNOSIS — R5381 Other malaise: Secondary | ICD-10-CM | POA: Diagnosis not present

## 2019-01-03 LAB — BASIC METABOLIC PANEL
Anion gap: 13 (ref 5–15)
BUN: 18 mg/dL (ref 6–20)
CO2: 22 mmol/L (ref 22–32)
Calcium: 9.5 mg/dL (ref 8.9–10.3)
Chloride: 105 mmol/L (ref 98–111)
Creatinine, Ser: 1.08 mg/dL — ABNORMAL HIGH (ref 0.44–1.00)
GFR calc Af Amer: >60 mL/min (ref >60)
GFR calc non Af Amer: 56 mL/min — ABNORMAL LOW (ref >60)
Glucose, Bld: 172 mg/dL — ABNORMAL HIGH (ref 70–99)
Potassium: 4.2 mmol/L (ref 3.5–5.1)
Sodium: 140 mmol/L (ref 135–145)

## 2019-01-03 LAB — CBC
HCT: 40.4 % (ref 36.0–46.0)
Hemoglobin: 12.7 g/dL (ref 12.0–15.0)
MCH: 27.9 pg (ref 26.0–34.0)
MCHC: 31.4 g/dL (ref 30.0–36.0)
MCV: 88.8 fL (ref 80.0–100.0)
Platelets: 274 10*3/uL (ref 150–400)
RBC: 4.55 MIL/uL (ref 3.87–5.11)
RDW: 14.9 % (ref 11.5–15.5)
WBC: 6.7 10*3/uL (ref 4.0–10.5)
nRBC: 0 % (ref 0.0–0.2)

## 2019-01-03 MED ORDER — SODIUM CHLORIDE 0.9 % IV BOLUS
1000.0000 mL | Freq: Once | INTRAVENOUS | Status: AC
  Administered 2019-01-03: 1000 mL via INTRAVENOUS

## 2019-01-03 MED ORDER — METOPROLOL SUCCINATE ER 25 MG PO TB24: 25.0000 mg | ORAL_TABLET | Freq: Every day | ORAL | 3 refills | Status: DC

## 2019-01-03 MED ORDER — ADENOSINE 6 MG/2ML IV SOLN
INTRAVENOUS | Status: AC
  Filled 2019-01-03: qty 6

## 2019-01-03 MED ORDER — ASPIRIN EC 81 MG PO TBEC: 81.0000 mg | DELAYED_RELEASE_TABLET | Freq: Every day | ORAL | 3 refills | Status: DC

## 2019-01-03 MED ORDER — ADENOSINE 6 MG/2ML IV SOLN
INTRAVENOUS | Status: AC | PRN
  Administered 2019-01-03: 6 mg via INTRAVENOUS

## 2019-01-03 NOTE — ED Notes (Signed)
States she feels good

## 2019-01-03 NOTE — Code Documentation (Signed)
Placed on 02 4l [place]

## 2019-01-03 NOTE — Code Documentation (Signed)
No adverse reactions to adenosine  Pt states she feels better

## 2019-01-03 NOTE — ED Provider Notes (Signed)
[facility] Emergency Department Provider Note MRN:  [id number]  Arrival date & time: 01/03/19     Chief Complaint   Tachycardia   History of Present Illness   [name] is a 58 y.o. year-old female with a history of factor V Leiden presenting to the ED with chief complaint of tachycardia.  Patient explains that over the past few years she has had intermittent episodes of palpitations and rapid heart rate, but these episodes usually last a few moments and then resolved.  She explains that she was walking her dog this morning at 6:45 AM when she experienced 1 of these episodes, however this time the sensation did not go away.  Described as a rapid heartbeat and palpitations, with some fatigue and malaise.  Denies chest pain or shortness of breath.  Denies recent fever or cough, no abdominal pain, no leg pain or swelling, no recent travel, takes a daily full dose aspirin and has not missed any doses.  No drugs or alcohol.  Review of Systems  A complete 10 system review of systems was obtained and all systems are negative except as noted in the HPI and PMH.   Patient's Health History    Past Medical History:  Diagnosis Date  . Allergy   . Anxiety   . Blood clot in vein    right leg  . Chest tightness   . Depression   . Factor V deficiency (HCC)   . Hyperlipidemia   . Migraine   . Morbid obesity with BMI of 40.0-44.9, adult (HCC)   . Varicose vein     Past Surgical History:  Procedure Laterality Date  . TUBAL LIGATION  2006  . VEIN LIGATION Bilateral     Family History  Problem Relation Age of Onset  . Diabetes Mother 74       type 2  . Diabetes Father 60       type 2  . Dementia Father 67  . Hyperlipidemia Father   . Diabetes Sister   . Hyperlipidemia Sister   . Hypertension Sister   . Diabetes Brother   . Heart disease Brother   . Hyperlipidemia Brother   . Hypertension Brother   . Breast cancer Sister     Social History    Socioeconomic History  . Marital status: Divorced    Spouse name: Not on file  . Number of children: 0  . Years of education: 67  . Highest education level: Not on file  Occupational History  . Occupation: Engineering geologist: [employer]  Tobacco Use  . Smoking status: Never Smoker  . Smokeless tobacco: Never Used  Substance and Sexual Activity  . Alcohol use: Yes    Alcohol/week: 1.0 standard drinks    Types: 1 Glasses of wine per week    Comment: OCC  . Drug use: No  . Sexual activity: Not on file  Other Topics Concern  . Not on file  Social History Narrative   Patient is divorced. Patient has a high school education and works for [employer].    Right handed.   Caffeine- two daily            Social Determinants of Health   Financial Resource Strain:   . Difficulty of Paying Living Expenses: Not on file  Food Insecurity:   . Worried About Programme researcher, broadcasting/film/video in the Last Year: Not on file  . Ran Out of Food  in the Last Year: Not on file  Transportation Needs:   . Lack of Transportation (Medical): Not on file  . Lack of Transportation (Non-Medical): Not on file  Physical Activity:   . Days of Exercise per Week: Not on file  . Minutes of Exercise per Session: Not on file  Stress:   . Feeling of Stress : Not on file  Social Connections:   . Frequency of Communication with Friends and Family: Not on file  . Frequency of Social Gatherings with Friends and Family: Not on file  . Attends Religious Services: Not on file  . Active Member of Clubs or Organizations: Not on file  . Attends BankerClub or Organization Meetings: Not on file  . Marital Status: Not on file  Intimate Partner Violence:   . Fear of Current or Ex-Partner: Not on file  . Emotionally Abused: Not on file  . Physically Abused: Not on file  . Sexually Abused: Not on file     Physical Exam  Vital Signs and Nursing Notes reviewed Vitals:   01/03/19 0945 01/03/19 1000  BP:  126/89 129/90  Pulse: 80 73  Resp: 16 14  Temp:    SpO2: 100% 100%    CONSTITUTIONAL: Well-appearing, NAD NEURO:  Alert and oriented x 3, no focal deficits EYES:  eyes equal and reactive ENT/NECK:  no LAD, no JVD CARDIO: Tachycardic rate, well-perfused, normal S1 and S2 PULM:  CTAB no wheezing or rhonchi GI/GU:  normal bowel sounds, non-distended, non-tender MSK/SPINE:  No gross deformities, no edema SKIN:  no rash, atraumatic PSYCH:  Appropriate speech and behavior  Diagnostic and Interventional Summary    EKG Interpretation  Date/Time:  Thursday January 03 2019 08:28:19 EST Ventricular Rate:  204 PR Interval:    QRS Duration: 86 QT Interval:  231 QTC Calculation: 426 R Axis:   50 Text Interpretation: Supraventricular tachycardia Abnormal R-wave progression, early transition Repolarization abnormality, prob rate related Baseline wander in lead(s) II III aVF V4 V5 V6 Confirmed by Kennis CarinaBero, Arminda Foglio [phone]([id number]) on 01/03/2019 8:44:06 AM       EKG Interpretation  Date/Time:  Thursday January 03 2019 08:39:17 EST Ventricular Rate:  115 PR Interval:    QRS Duration: 75 QT Interval:  326 QTC Calculation: 451 R Axis:   31 Text Interpretation: Sinus tachycardia, resolution of SVT abnormal R-wave progression, early transition Borderline repolarization abnormality Baseline wander in lead(s) II III aVF Confirmed by Kennis CarinaBero, Etola Mull [phone]) on 01/03/2019 8:46:21 AM       Labs Reviewed  BASIC METABOLIC PANEL - Abnormal; Notable for the following components:      Result Value   Glucose, Bld 172 (*)    Creatinine, Ser 1.08 (*)    GFR calc non Af Amer 56 (*)    All other components within normal limits  CBC    XR Chest Single View  Final Result      Medications  sodium chloride 0.9 % bolus 1,000 mL (has no administration in time range)  adenosine (ADENOCARD) 6 MG/2ML injection ( Intravenous Canceled Entry 01/03/19 0830)     Procedures  /  Critical  Care .Cardioversion  Date/Time: 01/03/2019 8:47 AM Performed by: Sabas SousBero, Synda Bagent M, MD Authorized by: Sabas SousBero, Bright Spielmann M, MD   Consent:    Consent obtained:  Verbal   Consent given by:  Patient   Risks discussed:  Induced arrhythmia and pain Pre-procedure details:    Cardioversion basis:  Emergent   Rhythm:  Supraventricular tachycardia   Electrode placement:  Anterior-posterior Patient sedated: No Attempt one:    Cardioversion mode attempt one: Medical cardioversion with 6 mg adenosine.   Shock outcome:  Conversion to normal sinus rhythm Post-procedure details:    Patient status:  Awake   Patient tolerance of procedure:  Tolerated well, no immediate complications .Critical Care Performed by: Maudie Flakes, MD Authorized by: Maudie Flakes, MD   Critical care provider statement:    Critical care time (minutes):  32   Critical care was necessary to treat or prevent imminent or life-threatening deterioration of the following conditions: Supraventricular tachycardia.   Critical care was time spent personally by me on the following activities:  Discussions with consultants, evaluation of patient's response to treatment, examination of patient, ordering and performing treatments and interventions, ordering and review of laboratory studies, ordering and review of radiographic studies, pulse oximetry, re-evaluation of patient's condition, obtaining history from patient or surrogate and review of old charts    ED Course and Medical Decision Making  I have reviewed the triage vital signs and the nursing notes.  Pertinent labs & imaging results that were available during my care of the patient were reviewed by me and considered in my medical decision making (see below for details).     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  Arrived with heart rate between 200 and 210.  Blood pressure in the 938 systolic, well-appearing,  minimally symptomatic.  Vagal maneuver not successful.  Cardioverted as described above with adenosine.  Patient feeling much better, in sinus rhythm, rate controlled.  Screening with basic labs to exclude anemia or electrolyte disturbance.  Patient is without chest pain or shortness of breath, no evidence of DVT, no recent travel, no missed doses of her aspirin.  She does have factor V Leiden and so pulmonary embolism was considered as a trigger or cause of her SVT but this is felt to be unlikely.  Given that she has been experiencing these episodes of tachycardia or palpitations for the past year or 2, I suspect an underlying primary cardiac etiology of her SVT and she is best suited for cardiology follow-up as an outpatient.  10:14 AM update: Patient was monitored in the emergency department for 2 hours with continued normal sinus rhythm after cardioversion.  After IV fluids patient with heart rate in the 70s, continues to be asymptomatic.  [place] for discharge.  Barth Kirks. Sedonia Small, [place] [email or web address]  Final Clinical Impressions(s) / ED Diagnoses     ICD-10-CM   1. SVT (supraventricular tachycardia) (HCC)  I47.1 XR Chest Single View    XR Chest Single View    ED Discharge Orders    None       Discharge Instructions Discussed with and Provided to Patient:     Discharge Instructions     You were evaluated in the Emergency Department and after careful evaluation, we did not find any emergent condition requiring admission or further testing in the hospital.  Your exam/testing today is overall reassuring.  We were able to convert your abnormal heart rhythm back to normal here in the emergency department.  Please follow-up with cardiology to discuss your symptoms and determine whether or not you should be on preventative medications.  Please return to the Emergency Department if you experience any worsening of your condition.  We  encourage you to follow up with a primary care provider.  Thank you for allowing [place] to  be a part of your care.       Sabas Sous, MD 01/03/19 [phone]

## 2019-01-03 NOTE — Progress Notes (Signed)
Cardiology Office Note:    Date:  01/03/2019   ID:  [name], DOB Apr 14, 1960, MRN [id number]  PCP:  Vicenta Aly, [place]  Cardiologist:  Shirlee More, MD   Referring MD: Vicenta Aly, FNP  ASSESSMENT:    1. SVT (supraventricular tachycardia) ([place])   2. Intractable chronic migraine without aura and without status migrainosus   3. Factor 5 Leiden mutation, heterozygous ([place])    PLAN:    In order of problems listed above:  1. She has a long history of SVT symptoms more bothersome captured today and broke with adenosine.  I reviewed the benign nature of the disorder with her the importance of avoiding over-the-counter proarrhythmic drugs and the option of medical therapy refer for catheter radiofrequency ablation.  We will place her on long-acting beta-blocker reassess in 3 months and if frequent episodes refer for EP consultation. 2. She may have extra benefit from a beta-blocker with her migraine 3. She asked my opinion about treatment of her factor V mutation.  Is no recurrence of DVT and for when she describes it is occurring infrapopliteal I would not commit her to lifelong anticoagulation at this time  Next appointment   Medication Adjustments/Labs and Tests Ordered: Current medicines are reviewed at length with the patient today.  Concerns regarding medicines are outlined above.  No orders of the defined types were placed in this encounter.  Meds ordered this encounter  Medications  . aspirin EC 81 MG tablet    Sig: Take 1 tablet (81 mg total) by mouth daily.    Dispense:  90 tablet    Refill:  3  . metoprolol succinate (TOPROL XL) 25 MG 24 hr tablet    Sig: Take 1 tablet (25 mg total) by mouth daily.    Dispense:  30 tablet    Refill:  3     Chief Complaint  Patient presents with  . New Patient (Initial Visit)    SVT & Tachycardia    History of Present Illness:    [name] is a 58 y.o. female with type 2 diabetes hyperlipidemia chronic venous  insufficiency who is being seen today for the evaluation of episode of SVT seen this morning in the emergency room [facility] [place] at the request of Vicenta Aly, [place].  Unfortunately I cannot review the EKG and I think it has been scanned yet but is described as shown supraventricular tachycardia rate of 204 bpm and after adenosine sinus tachycardia rate of 115 bpm.  Labs showed a creatinine 1.08 potassium 4.2 her blood sugar is significant elevated 172 is normal unremarkable  .  For several decades she has had episodes of rapid heart rhythm usually infrequent not lasting more than 5 minutes and she learned to break the episodes of deep breathing.  Recently in the last months is occurring more frequently lasting longer up to 10 to 15 minutes and today the episode would not stop she felt lightheaded she had numbness of her hands and presented to the ED.  There is no chest pain.  She has no exercise intolerance shortness of breath edema orthopnea.  She broke with adenosine and is seen today in follow-up.  She has no known history of heart disease she relates a background history of DVT with sounds like it was infrapopliteal which occurred in the setting of factor V Leiden and oral contraceptives.  She has not had a pulmonary embolism or from her description or records I can see a proximal  DVT and she has been maintained on aspirin to reduce to 81 mg daily.  She has migraine headache and takes Effexor for prophylaxis.  Has diabetes or prediabetes and is frustrated by weight gain. Past Medical History:  Diagnosis Date  . Allergy   . Anxiety   . Blood clot in vein    right leg  . Chest tightness   . Depression   . Factor V deficiency (HCC)   . Hyperlipidemia   . Migraine   . Morbid obesity with BMI of 40.0-44.9, adult (HCC)   . Varicose vein     Past Surgical History:  Procedure Laterality Date  . TUBAL LIGATION  2006  . VEIN LIGATION Bilateral     Current Medications: Current Meds    Medication Sig  . Biotin 62130 MCG TABS Take by mouth.  . Cholecalciferol (VITAMIN D3 PO) Take 5,000 Units by mouth daily.  . cimetidine (TAGAMET) 200 MG tablet Take 200 mg by mouth daily as needed.  . diclofenac (VOLTAREN) 75 MG EC tablet Take 1 tablet (75 mg total) by mouth 2 (two) times daily.  . Garlic 1000 MG CAPS Take by mouth.  Marland Kitchen ibuprofen (ADVIL,MOTRIN) 200 MG tablet Take 400 mg by mouth every 6 (six) hours as needed for mild pain.  Marland Kitchen KRILL OIL PO Take 750 mg by mouth daily.  Marland Kitchen MAGNESIUM PO Take 250 mg by mouth daily.  . metFORMIN (GLUCOPHAGE) 500 MG tablet Take by mouth 2 (two) times daily with a meal.  . SUMAtriptan (IMITREX STATDOSE SYSTEM) 6 MG/0.5ML SOAJ Inject 6 mg into the skin as needed.  . SUMAtriptan (IMITREX) 100 MG tablet Take 1 tab at onset of migraine.  May repeat in 2 hrs, if needed.  Max dose: 2 tabs/day. This is a 90 day prescription.  Marland Kitchen venlafaxine XR (EFFEXOR-XR) 75 MG 24 hr capsule Take 1 capsule (75 mg total) by mouth daily with breakfast.  . [DISCONTINUED] aspirin 325 MG tablet Take 325 mg by mouth 2 (two) times daily.     Allergies:   Statins and Codeine   Social History   Socioeconomic History  . Marital status: Divorced    Spouse name: Not on file  . Number of children: 0  . Years of education: 91  . Highest education level: Not on file  Occupational History  . Occupation: Engineering geologist: [employer]  Tobacco Use  . Smoking status: Never Smoker  . Smokeless tobacco: Never Used  Substance and Sexual Activity  . Alcohol use: Yes    Alcohol/week: 1.0 standard drinks    Types: 1 Glasses of wine per week    Comment: OCC  . Drug use: No  . Sexual activity: Not on file  Other Topics Concern  . Not on file  Social History Narrative   Patient is divorced. Patient has a high school education and works for [employer].    Right handed.   Caffeine- two daily            Social Determinants of Health   Financial  Resource Strain:   . Difficulty of Paying Living Expenses: Not on file  Food Insecurity:   . Worried About Programme researcher, broadcasting/film/video in the Last Year: Not on file  . Ran Out of Food in the Last Year: Not on file  Transportation Needs:   . Lack of Transportation (Medical): Not on file  . Lack of Transportation (Non-Medical): Not on file  Physical Activity:   .  Days of Exercise per Week: Not on file  . Minutes of Exercise per Session: Not on file  Stress:   . Feeling of Stress : Not on file  Social Connections:   . Frequency of Communication with Friends and Family: Not on file  . Frequency of Social Gatherings with Friends and Family: Not on file  . Attends Religious Services: Not on file  . Active Member of Clubs or Organizations: Not on file  . Attends BankerClub or Organization Meetings: Not on file  . Marital Status: Not on file     Family History: The patient's family history includes Breast cancer in her sister; Dementia (age of onset: 8080) in her father; Diabetes in her brother and sister; Diabetes (age of onset: 3368) in her father; Diabetes (age of onset: 5270) in her mother; Heart disease in her brother; Hyperlipidemia in her brother, father, and sister; Hypertension in her brother and sister.  ROS:   Review of Systems  Constitution: Positive for weight gain.  HENT: Negative.   Eyes: Negative.   Cardiovascular: Positive for palpitations.  Respiratory: Negative.   Endocrine: Negative.   Hematologic/Lymphatic: Negative.   Skin: Negative.   Musculoskeletal: Positive for joint pain.  Gastrointestinal: Negative.   Genitourinary: Negative.   Neurological: Positive for headaches.  Psychiatric/Behavioral: Negative.   Allergic/Immunologic: Negative.    Please see the history of present illness.     All other systems reviewed and are negative.  EKGs/Labs/Other Studies Reviewed:    The following studies were reviewed today:     Recent Labs: 01/03/2019: BUN 18; Creatinine, Ser 1.08;  Hemoglobin 12.7; Platelets 274; Potassium 4.2; Sodium 140  Recent Lipid Panel    Component Value Date/Time   CHOL 217 (H) 12/25/2012 0814   TRIG 126 12/25/2012 0814   HDL 52 12/25/2012 0814   CHOLHDL 4.2 12/25/2012 0814   VLDL 25 12/25/2012 0814   LDLCALC 140 (H) 12/25/2012 0814    Physical Exam:    VS:  BP 130/90 (BP Location: Right Arm, Patient Position: Sitting, Cuff Size: Normal)   Pulse 86   Ht 5' (1.524 m)   Wt 249 lb (112.9 kg)   LMP 05/31/2011   SpO2 98%   BMI 48.63 kg/m     Wt Readings from Last 3 Encounters:  01/03/19 249 lb (112.9 kg)  01/03/19 241 lb (109.3 kg)  10/25/18 241 lb (109.3 kg)     GEN:  Well nourished, well developed in no acute distress HEENT: Normal NECK: No JVD; No carotid bruits LYMPHATICS: No lymphadenopathy CARDIAC: RRR, no murmurs, rubs, gallops RESPIRATORY:  Clear to auscultation without rales, wheezing or rhonchi  ABDOMEN: Soft, non-tender, non-distended MUSCULOSKELETAL:  No edema; No deformity  SKIN: Warm and dry NEUROLOGIC:  Alert and oriented x 3 PSYCHIATRIC:  Normal affect     Signed, Norman HerrlichBrian Armany Mano, MD  01/03/2019 4:16 PM    [facility]

## 2019-01-03 NOTE — Code Documentation (Signed)
Attempted vaso maneuver  Blew on syringe and raised lower exr into air without results

## 2019-01-03 NOTE — Discharge Instructions (Addendum)
You were evaluated in the Emergency Department and after careful evaluation, we did not find any emergent condition requiring admission or further testing in the hospital.  Your exam/testing today is overall reassuring.  We were able to convert your abnormal heart rhythm back to normal here in the emergency department.  Please follow-up with cardiology to discuss your symptoms and determine whether or not you should be on preventative medications.  Please return to the Emergency Department if you experience any worsening of your condition.  We encourage you to follow up with a primary care provider.  Thank you for allowing [place] to be a part of your care.

## 2019-01-03 NOTE — ED Triage Notes (Addendum)
C/o rapid heart rate onset 0745 this am  Tingling to left hand and lighthead

## 2019-01-03 NOTE — Patient Instructions (Addendum)
Medication Instructions:  Your physician has recommended you make the following change in your medication:   DECREASE aspirin 81 mg: Take 1 tablet daily  START metoprolol succinate (toprol XL) 25 mg: Take 1 tablet daily   *If you need a refill on your cardiac medications before your next appointment, please call your pharmacy*  Lab Work: None  If you have labs (blood work) drawn today and your tests are completely normal, you will receive your results only by: Marland Kitchen. MyChart Message (if you have MyChart) OR . A paper copy in the mail If you have any lab test that is abnormal or we need to change your treatment, we will call you to review the results.  Testing/Procedures: None  Follow-Up: At [facility], you and your health needs are our priority.  As part of our continuing mission to provide you with exceptional heart care, we have created designated Provider Care Teams.  These Care Teams include your primary Cardiologist (physician) and Advanced Practice Providers (APPs -  Physician Assistants and Nurse Practitioners) who all work together to provide you with the care you need, when you need it.  Your next appointment:   3 month(s)  The format for your next appointment:   In Person  Provider:   Norman HerrlichBrian Syanna Remmert, MD or Thomasene RippleKardie Tobb, MD       Supraventricular Tachycardia, Adult Supraventricular tachycardia (SVT) is a kind of abnormal heartbeat. It makes your heart beat very fast and then beat at a normal speed. A normal resting heartbeat is 60-100 times a minute. This condition can make your heart beat more than 150 times a minute. Times of having a fast heartbeat (episodes) can be scary, but they are usually not dangerous. In rare cases, they may lead to heart failure if:  They happen many times per day.  Last longer than a few seconds. What are the causes?  A normal heartbeat starts when an area called the sinoatrial node sends out an electrical signal. In SVT, other areas of  the heart send out signals that get in the way of the signal from the sinoatrial node. What increases the risk? You are more likely to develop this condition if you are:  1312-59 years old.  A woman. The following factors may make you more likely to develop this condition:  Stress.  Tiredness.  Smoking.  Stimulant drugs, such as cocaine and methamphetamine.  Alcohol.  Caffeine.  Pregnancy.  Feeling worried or nervous (anxiety). What are the signs or symptoms?  A pounding heart.  A feeling that your heart is skipping beats (palpitations).  Weakness.  Trouble getting enough air.  Pain or tightness in your chest.  Feeling like you are going to pass out (faint).  Feeling worried or nervous.  Dizziness.  Sweating.  Feeling sick to your stomach (nausea).  Passing out.  Tiredness. Sometimes, there are no symptoms. How is this treated?  Vagal nerve stimulation. Ways to do this include: ? Holding your breath and pushing, as though you are pooping (having a bowel movement). ? Massaging an area on one side of your neck. Do not try this yourself. Only a doctor should do this. If done the wrong way, it can lead to a stroke. ? Bending forward with your head between your legs. ? Coughing while bending forward with your head between your legs. ? Closing your eyes and massaging your eyeballs. Ask a doctor how to do this.  Medicines that prevent attacks.  Medicine to stop an attack given  through an IV tube at the hospital.  A small electric shock (cardioversion) that stops an attack.  Radiofrequency ablation. In this procedure, a small, thin tube (catheter) is used to send energy to the area that is causing the rapid heartbeats. If you do not have symptoms, you may not need treatment. Follow these instructions at home: Stress  Avoid things that make you feel stressed.  To deal with stress, try: ? Doing yoga or meditation, or being out in nature. ? Listening to  relaxing music. ? Doing deep breathing. ? Taking steps to be healthy, such as getting lots of sleep, exercising, and eating a balanced diet. ? Talking with a mental health doctor. Lifestyle   Try to get at least 7 hours of sleep each night.  Do not use any products that contain nicotine or tobacco, such as cigarettes, e-cigarettes, and chewing tobacco. If you need help quitting, ask your doctor.  Be aware of how alcohol affects you. ? If alcohol gives you a fast heartbeat, do not drink alcohol. ? If alcohol does not seem to give you a fast heartbeat, limit alcohol use to no more than 1 drink a day for women who are not pregnant, and 2 drinks a day for men. In the [place] one drink is one of these: ? 12 oz of beer (355 mL). ? 5 oz of wine (148 mL). ? 1 oz of hard liquor (44 mL).  Be aware of how caffeine affects you. ? If caffeine gives you a fast heartbeat, do not eat, drink, or use anything with caffeine in it. ? If caffeine does not seem to give you a fast heartbeat, limit how much caffeine you eat, drink, or use.  Do not use stimulant drugs. If you need help quitting, ask your doctor. General instructions  Stay at a healthy weight.  Exercise regularly. Ask your doctor about good activities for you. Try one or a mixture of these: ? 150 minutes a week of gentle exercise, like walking or yoga. ? 75 minutes a week of exercise that is very active, like running or swimming.  Do vagus nerve treatments to slow down your heartbeat as told by your doctor.  Take over-the-counter and prescription medicines only as told by your doctor.  Keep all follow-up visits as told by your doctor. This is important. Contact a doctor if:  You have a fast heartbeat more often.  Times of having a fast heartbeat last longer than before.  Home treatments to slow down your heartbeat do not help.  You have new symptoms. Get help right away if:  You have chest pain.  Your symptoms get worse.  You  have trouble breathing.  Your heart beats very fast for more than 20 minutes.  You pass out. These symptoms may be an emergency. Do not wait to see if the symptoms will go away. Get medical help right away. Call your local emergency services (911 in the [place] Do not drive yourself to the hospital. Summary  SVT is a type of abnormal heart beat.  This condition can make your heart beat more than 150 times a minute.  Treatment depends on how often the condition happens and your symptoms. This information is not intended to replace advice given to you by your health care provider. Make sure you discuss any questions you have with your health care provider. Document Released: 01/03/2005 Document Revised: 11/21/2017 Document Reviewed: 11/21/2017 Elsevier Patient Education  [place].   Metoprolol extended-release tablets What  is this medicine? METOPROLOL (me TOE proe lole) is a beta-blocker. Beta-blockers reduce the workload on the heart and help it to beat more regularly. This medicine is used to treat high blood pressure and to prevent chest pain. It is also used to after a heart attack and to prevent an additional heart attack from occurring. This medicine may be used for other purposes; ask your health care provider or pharmacist if you have questions. COMMON BRAND NAME(S): toprol, Toprol XL What should I tell my health care provider before I take this medicine? They need to know if you have any of these conditions:  diabetes  heart or vessel disease like slow heart rate, worsening heart failure, heart block, sick sinus syndrome or Raynaud's disease  kidney disease  liver disease  lung or breathing disease, like asthma or emphysema  pheochromocytoma  thyroid disease  an unusual or allergic reaction to metoprolol, other beta-blockers, medicines, foods, dyes, or preservatives  pregnant or trying to get pregnant  breast-feeding How should I use this medicine? Take  this medicine by mouth with a glass of water. Follow the directions on the prescription label. Do not crush or chew. Take this medicine with or immediately after meals. Take your doses at regular intervals. Do not take more medicine than directed. Do not stop taking this medicine suddenly. This could lead to serious heart-related effects. Talk to your pediatrician regarding the use of this medicine in children. While this drug may be prescribed for children as young as 6 years for selected conditions, precautions do apply. Overdosage: If you think you have taken too much of this medicine contact a poison control center or emergency room at once. NOTE: This medicine is only for you. Do not share this medicine with others. What if I miss a dose? If you miss a dose, take it as soon as you can. If it is almost time for your next dose, take only that dose. Do not take double or extra doses. What may interact with this medicine? This medicine may interact with the following medications:  certain medicines for blood pressure, heart disease, irregular heart beat  certain medicines for depression, like monoamine oxidase (MAO) inhibitors, fluoxetine, or paroxetine  clonidine  dobutamine  epinephrine  isoproterenol  reserpine This list may not describe all possible interactions. Give your health care provider a list of all the medicines, herbs, non-prescription drugs, or dietary supplements you use. Also tell them if you smoke, drink alcohol, or use illegal drugs. Some items may interact with your medicine. What should I watch for while using this medicine? Visit your doctor or health care professional for regular check ups. Contact your doctor right away if your symptoms worsen. Check your blood pressure and pulse rate regularly. Ask your health care professional what your blood pressure and pulse rate should be, and when you should contact them. You may get drowsy or dizzy. Do not drive, use  machinery, or do anything that needs mental alertness until you know how this medicine affects you. Do not sit or stand up quickly, especially if you are an older patient. This reduces the risk of dizzy or fainting spells. Contact your doctor if these symptoms continue. Alcohol may interfere with the effect of this medicine. Avoid alcoholic drinks. This medicine may increase blood sugar. Ask your healthcare provider if changes in diet or medicines are needed if you have diabetes. What side effects may I notice from receiving this medicine? Side effects that you should report to  your doctor or health care professional as soon as possible:  allergic reactions like skin rash, itching or hives  cold or numb hands or feet  depression  difficulty breathing  faint  fever with sore throat  irregular heartbeat, chest pain  rapid weight gain   signs and symptoms of high blood sugar such as being more thirsty or hungry or having to urinate more than normal. You may also feel very tired or have blurry vision.  swollen legs or ankles Side effects that usually do not require medical attention (report to your doctor or health care professional if they continue or are bothersome):  anxiety or nervousness  change in sex drive or performance  dry skin  headache  nightmares or trouble sleeping  short term memory loss  stomach upset or diarrhea This list may not describe all possible side effects. Call your doctor for medical advice about side effects. You may report side effects to FDA at 1-800-FDA-1088. Where should I keep my medicine? Keep out of the reach of children. Store at room temperature between 15 and 30 degrees C (59 and 86 degrees F). Throw away any unused medicine after the expiration date. NOTE: This sheet is a summary. It may not cover all possible information. If you have questions about this medicine, talk to your doctor, pharmacist, or health care provider.  2020  Elsevier/Gold Standard (2017-10-24 11:09:41)   1. Avoid all over-the-counter antihistamines except Claritin/Loratadine and Zyrtec/Cetrizine. 2. Avoid all combination including cold sinus allergies flu decongestant and sleep medications 3. You can use Robitussin DM Mucinex and Mucinex DM for cough. 4. can use Tylenol aspirin ibuprofen and naproxen but no combinations such as sleep or sinus.

## 2019-01-03 NOTE — ED Notes (Signed)
HR 83.  Pt ambulated to restroom.  Pt tolerated well.

## 2019-02-04 ENCOUNTER — Emergency Department (HOSPITAL_COMMUNITY): Payer: 59

## 2019-02-04 ENCOUNTER — Other Ambulatory Visit: Payer: Self-pay

## 2019-02-04 ENCOUNTER — Encounter (HOSPITAL_COMMUNITY): Payer: Self-pay | Admitting: Internal Medicine

## 2019-02-04 ENCOUNTER — Inpatient Hospital Stay (HOSPITAL_COMMUNITY)
Admission: EM | Admit: 2019-02-04 | Discharge: 2019-02-08 | DRG: 177 | Disposition: A | Discharge status: Home / Self Care | Payer: 59 | Attending: Internal Medicine | Admitting: Internal Medicine

## 2019-02-04 DIAGNOSIS — Z7982 Long term (current) use of aspirin: Secondary | ICD-10-CM | POA: Diagnosis not present

## 2019-02-04 DIAGNOSIS — R197 Diarrhea, unspecified: Secondary | ICD-10-CM | POA: Diagnosis present

## 2019-02-04 DIAGNOSIS — G43909 Migraine, unspecified, not intractable, without status migrainosus: Secondary | ICD-10-CM | POA: Diagnosis present

## 2019-02-04 DIAGNOSIS — J1282 Pneumonia due to coronavirus disease 2019: Secondary | ICD-10-CM | POA: Diagnosis not present

## 2019-02-04 DIAGNOSIS — Z86711 Personal history of pulmonary embolism: Secondary | ICD-10-CM

## 2019-02-04 DIAGNOSIS — G43709 Chronic migraine without aura, not intractable, without status migrainosus: Secondary | ICD-10-CM | POA: Diagnosis not present

## 2019-02-04 DIAGNOSIS — Z87898 Personal history of other specified conditions: Secondary | ICD-10-CM

## 2019-02-04 DIAGNOSIS — U071 COVID-19: Principal | ICD-10-CM | POA: Diagnosis present

## 2019-02-04 DIAGNOSIS — E1140 Type 2 diabetes mellitus with diabetic neuropathy, unspecified: Secondary | ICD-10-CM

## 2019-02-04 DIAGNOSIS — R0902 Hypoxemia: Secondary | ICD-10-CM | POA: Diagnosis present

## 2019-02-04 DIAGNOSIS — E119 Type 2 diabetes mellitus without complications: Secondary | ICD-10-CM | POA: Diagnosis present

## 2019-02-04 DIAGNOSIS — IMO0002 Reserved for concepts with insufficient information to code with codable children: Secondary | ICD-10-CM | POA: Diagnosis present

## 2019-02-04 DIAGNOSIS — Z86718 Personal history of other venous thrombosis and embolism: Secondary | ICD-10-CM

## 2019-02-04 DIAGNOSIS — Z79899 Other long term (current) drug therapy: Secondary | ICD-10-CM | POA: Diagnosis not present

## 2019-02-04 DIAGNOSIS — Z6839 Body mass index (BMI) 39.0-39.9, adult: Secondary | ICD-10-CM

## 2019-02-04 DIAGNOSIS — Z8349 Family history of other endocrine, nutritional and metabolic diseases: Secondary | ICD-10-CM

## 2019-02-04 DIAGNOSIS — D6851 Activated protein C resistance: Secondary | ICD-10-CM | POA: Diagnosis not present

## 2019-02-04 DIAGNOSIS — Z833 Family history of diabetes mellitus: Secondary | ICD-10-CM

## 2019-02-04 DIAGNOSIS — E1165 Type 2 diabetes mellitus with hyperglycemia: Secondary | ICD-10-CM | POA: Diagnosis present

## 2019-02-04 DIAGNOSIS — I471 Supraventricular tachycardia, unspecified: Secondary | ICD-10-CM

## 2019-02-04 DIAGNOSIS — E669 Obesity, unspecified: Secondary | ICD-10-CM | POA: Diagnosis present

## 2019-02-04 DIAGNOSIS — Z791 Long term (current) use of non-steroidal anti-inflammatories (NSAID): Secondary | ICD-10-CM

## 2019-02-04 DIAGNOSIS — R0602 Shortness of breath: Secondary | ICD-10-CM

## 2019-02-04 DIAGNOSIS — Z7984 Long term (current) use of oral hypoglycemic drugs: Secondary | ICD-10-CM

## 2019-02-04 HISTORY — DX: Personal history of other specified conditions: Z87.898

## 2019-02-04 HISTORY — DX: Supraventricular tachycardia: I47.1

## 2019-02-04 HISTORY — DX: Supraventricular tachycardia, unspecified: I47.10

## 2019-02-04 HISTORY — DX: COVID-19: U07.1

## 2019-02-04 LAB — CBC WITH DIFFERENTIAL/PLATELET
Abs Immature Granulocytes: 0.04 10*3/uL (ref 0.00–0.07)
Basophils Absolute: 0 10*3/uL (ref 0.0–0.1)
Basophils Relative: 0 %
Eosinophils Absolute: 0 10*3/uL (ref 0.0–0.5)
Eosinophils Relative: 0 %
HCT: 40.4 % (ref 36.0–46.0)
Hemoglobin: 12.9 g/dL (ref 12.0–15.0)
Immature Granulocytes: 1 %
Lymphocytes Relative: 15 %
Lymphs Abs: 0.8 10*3/uL (ref 0.7–4.0)
MCH: 27 pg (ref 26.0–34.0)
MCHC: 31.9 g/dL (ref 30.0–36.0)
MCV: 84.7 fL (ref 80.0–100.0)
Monocytes Absolute: 0.5 10*3/uL (ref 0.1–1.0)
Monocytes Relative: 9 %
Neutro Abs: 4 10*3/uL (ref 1.7–7.7)
Neutrophils Relative %: 75 %
Platelets: 203 10*3/uL (ref 150–400)
RBC: 4.77 MIL/uL (ref 3.87–5.11)
RDW: 14.6 % (ref 11.5–15.5)
WBC: 5.3 10*3/uL (ref 4.0–10.5)
nRBC: 0 % (ref 0.0–0.2)

## 2019-02-04 LAB — CBC
HCT: 35.8 % — ABNORMAL LOW (ref 36.0–46.0)
Hemoglobin: 11.6 g/dL — ABNORMAL LOW (ref 12.0–15.0)
MCH: 27.4 pg (ref 26.0–34.0)
MCHC: 32.4 g/dL (ref 30.0–36.0)
MCV: 84.6 fL (ref 80.0–100.0)
Platelets: 205 10*3/uL (ref 150–400)
RBC: 4.23 MIL/uL (ref 3.87–5.11)
RDW: 14.5 % (ref 11.5–15.5)
WBC: 4.2 10*3/uL (ref 4.0–10.5)
nRBC: 0 % (ref 0.0–0.2)

## 2019-02-04 LAB — COMPREHENSIVE METABOLIC PANEL
ALT: 26 U/L (ref 0–44)
AST: 34 U/L (ref 15–41)
Albumin: 3.1 g/dL — ABNORMAL LOW (ref 3.5–5.0)
Alkaline Phosphatase: 79 U/L (ref 38–126)
Anion gap: 14 (ref 5–15)
BUN: 9 mg/dL (ref 6–20)
CO2: 18 mmol/L — ABNORMAL LOW (ref 22–32)
Calcium: 8.6 mg/dL — ABNORMAL LOW (ref 8.9–10.3)
Chloride: 105 mmol/L (ref 98–111)
Creatinine, Ser: 1.02 mg/dL — ABNORMAL HIGH (ref 0.44–1.00)
GFR calc Af Amer: >60 mL/min (ref >60)
GFR calc non Af Amer: >60 mL/min (ref >60)
Glucose, Bld: 198 mg/dL — ABNORMAL HIGH (ref 70–99)
Potassium: 4 mmol/L (ref 3.5–5.1)
Sodium: 137 mmol/L (ref 135–145)
Total Bilirubin: 0.6 mg/dL (ref 0.3–1.2)
Total Protein: 7.1 g/dL (ref 6.5–8.1)

## 2019-02-04 LAB — RESPIRATORY PANEL BY RT PCR (FLU A&B, COVID)
Influenza A by PCR: NEGATIVE
Influenza B by PCR: NEGATIVE
SARS Coronavirus 2 by RT PCR: POSITIVE — AB

## 2019-02-04 LAB — LACTIC ACID, PLASMA: Lactic Acid, Venous: 1.1 mmol/L (ref 0.5–1.9)

## 2019-02-04 MED ORDER — DEXAMETHASONE SODIUM PHOSPHATE 10 MG/ML IJ SOLN
6.0000 mg | INTRAMUSCULAR | Status: DC
  Administered 2019-02-05: 6 mg via INTRAVENOUS
  Filled 2019-02-04: qty 1

## 2019-02-04 MED ORDER — SODIUM CHLORIDE 0.9% FLUSH
3.0000 mL | Freq: Once | INTRAVENOUS | Status: AC
  Administered 2019-02-04: 3 mL via INTRAVENOUS

## 2019-02-04 MED ORDER — SODIUM CHLORIDE 0.9 % IV SOLN: 100.0000 mg | Freq: Every day | INTRAVENOUS | Status: DC

## 2019-02-04 MED ORDER — NAPROXEN 500 MG PO TABS: 500.0000 mg | ORAL_TABLET | Freq: Two times a day (BID) | ORAL | 0 refills | Status: DC

## 2019-02-04 MED ORDER — ONDANSETRON HCL 4 MG/2ML IJ SOLN
4.0000 mg | Freq: Once | INTRAMUSCULAR | Status: AC
  Administered 2019-02-04: 4 mg via INTRAVENOUS
  Filled 2019-02-04: qty 2

## 2019-02-04 MED ORDER — SODIUM CHLORIDE 0.9 % IV SOLN
100.0000 mg | Freq: Every day | INTRAVENOUS | Status: DC
  Administered 2019-02-05: 20:00:00 100 mg via INTRAVENOUS
  Filled 2019-02-04: qty 20

## 2019-02-04 MED ORDER — SODIUM CHLORIDE 0.9 % IV BOLUS
250.0000 mL | Freq: Once | INTRAVENOUS | Status: AC
  Administered 2019-02-04: 250 mL via INTRAVENOUS

## 2019-02-04 MED ORDER — HYDROCOD POLST-CPM POLST ER 10-8 MG/5ML PO SUER: 5.0000 mL | Freq: Two times a day (BID) | ORAL | Status: DC | PRN

## 2019-02-04 MED ORDER — ASPIRIN EC 81 MG PO TBEC
81.0000 mg | DELAYED_RELEASE_TABLET | Freq: Every day | ORAL | Status: DC
  Administered 2019-02-05 – 2019-02-06 (×2): 81 mg via ORAL
  Filled 2019-02-04 (×2): qty 1

## 2019-02-04 MED ORDER — IPRATROPIUM-ALBUTEROL 20-100 MCG/ACT IN AERS
1.0000 | INHALATION_SPRAY | Freq: Four times a day (QID) | RESPIRATORY_TRACT | Status: DC
  Administered 2019-02-05 (×4): 1 via RESPIRATORY_TRACT
  Filled 2019-02-04: qty 4

## 2019-02-04 MED ORDER — SODIUM CHLORIDE 0.9 % IV SOLN: 200.0000 mg | Freq: Once | INTRAVENOUS | Status: DC

## 2019-02-04 MED ORDER — METOPROLOL SUCCINATE ER 25 MG PO TB24
25.0000 mg | ORAL_TABLET | Freq: Every day | ORAL | Status: DC
  Administered 2019-02-05 – 2019-02-08 (×3): 25 mg via ORAL
  Filled 2019-02-04 (×4): qty 1

## 2019-02-04 MED ORDER — INSULIN ASPART 100 UNIT/ML ~~LOC~~ SOLN
0.0000 [IU] | Freq: Three times a day (TID) | SUBCUTANEOUS | Status: DC
  Administered 2019-02-05: 3 [IU] via SUBCUTANEOUS
  Administered 2019-02-05 (×2): 5 [IU] via SUBCUTANEOUS
  Administered 2019-02-06: 8 [IU] via SUBCUTANEOUS
  Administered 2019-02-06 (×2): 3 [IU] via SUBCUTANEOUS
  Administered 2019-02-07 (×2): 8 [IU] via SUBCUTANEOUS
  Administered 2019-02-07 – 2019-02-08 (×2): 11 [IU] via SUBCUTANEOUS
  Administered 2019-02-08: 5 [IU] via SUBCUTANEOUS

## 2019-02-04 MED ORDER — ENOXAPARIN SODIUM 40 MG/0.4ML ~~LOC~~ SOLN
40.0000 mg | Freq: Two times a day (BID) | SUBCUTANEOUS | Status: DC
  Administered 2019-02-05 – 2019-02-07 (×6): 40 mg via SUBCUTANEOUS
  Filled 2019-02-04 (×7): qty 0.4

## 2019-02-04 MED ORDER — VENLAFAXINE HCL ER 75 MG PO CP24
75.0000 mg | ORAL_CAPSULE | Freq: Every day | ORAL | Status: DC
  Administered 2019-02-05 – 2019-02-08 (×4): 75 mg via ORAL
  Filled 2019-02-04 (×5): qty 1

## 2019-02-04 MED ORDER — DEXAMETHASONE SODIUM PHOSPHATE 10 MG/ML IJ SOLN
6.0000 mg | Freq: Once | INTRAMUSCULAR | Status: AC
  Administered 2019-02-04: 17:00:00 6 mg via INTRAVENOUS
  Filled 2019-02-04: qty 1

## 2019-02-04 MED ORDER — SODIUM CHLORIDE 0.9 % IV BOLUS: 500.0000 mL | Freq: Once | INTRAVENOUS | Status: DC

## 2019-02-04 MED ORDER — ONDANSETRON 4 MG PO TBDP: 4.0000 mg | ORAL_TABLET | Freq: Three times a day (TID) | ORAL | 0 refills | Status: DC | PRN

## 2019-02-04 MED ORDER — ACETAMINOPHEN 325 MG PO TABS
650.0000 mg | ORAL_TABLET | Freq: Once | ORAL | Status: AC
  Administered 2019-02-04: 650 mg via ORAL
  Filled 2019-02-04: qty 2

## 2019-02-04 MED ORDER — SODIUM CHLORIDE 0.9 % IV SOLN
200.0000 mg | Freq: Once | INTRAVENOUS | Status: AC
  Administered 2019-02-04: 200 mg via INTRAVENOUS
  Filled 2019-02-04: qty 40

## 2019-02-04 MED ORDER — ACETAMINOPHEN 325 MG PO TABS: 650.0000 mg | ORAL_TABLET | Freq: Four times a day (QID) | ORAL | Status: DC | PRN

## 2019-02-04 MED ORDER — SODIUM CHLORIDE 0.9 % IV SOLN
200.0000 mg | Freq: Once | INTRAVENOUS | Status: DC
  Filled 2019-02-04: qty 40

## 2019-02-04 MED ORDER — ACETAMINOPHEN 500 MG PO TABS: 500.0000 mg | ORAL_TABLET | Freq: Four times a day (QID) | ORAL | 0 refills | Status: DC | PRN

## 2019-02-04 MED ORDER — GUAIFENESIN-DM 100-10 MG/5ML PO SYRP: 10.0000 mL | ORAL_SOLUTION | ORAL | Status: DC | PRN

## 2019-02-04 NOTE — ED Provider Notes (Addendum)
[facility] EMERGENCY DEPARTMENT Provider Note   CSN: [id number] Arrival date & time: 02/04/19  1572     History Chief Complaint  Patient presents with  . Fever  . Fatigue    [name] is a 59 y.o. female with PMH of HLD, factor V Leiden, SVT, DVT, migraines, and type II DM who presents to the ED via EMS with a 5-day history of low-grade fever, generalized body aches, inability to tolerate p.o., diminished appetite, fatigue, nonbloody emesis, nonbloody loose stools, and dyspnea on exertion.  Patient also endorses a loss of taste and smell.  She denies any headache or dizziness, rhinorrhea, sore throat, chest pain, abdominal pain, or urinary symptoms.  She denies any obvious COVID-19 exposures and reports that she takes precautions while at work.  She has been taking tylenol with some effect.   HPI     Past Medical History:  Diagnosis Date  . Allergy   . Anxiety   . Blood clot in vein    right leg  . Chest tightness   . Depression   . Factor V deficiency (HCC)   . Hyperlipidemia   . Migraine   . Morbid obesity with BMI of 40.0-44.9, adult (HCC)   . Varicose vein     Patient Active Problem List   Diagnosis Date Noted  . Chronic migraine 10/19/2017  . Sleep apnea 10/19/2017  . Neuralgia 07/30/2015  . Factor 5 Leiden mutation, heterozygous (HCC) 07/17/2014  . Diabetes (HCC) 07/17/2014  . Vitamin D deficiency 07/17/2014  . 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 Chronic venous insufficiency 05/23/2013  . Migraine, chronic, without aura, intractable 09/13/2012  . Atypical chest pain 08/24/2011  . Hypercholesterolemia 08/24/2011  . Family history of premature coronary artery disease 08/24/2011    Past Surgical History:  Procedure Laterality Date  . TUBAL LIGATION  2006  . VEIN LIGATION Bilateral      OB History   No obstetric history on file.     Family History  Problem Relation Age of Onset  . Diabetes Mother 43       type 2  . Diabetes  Father 74       type 2  . Dementia Father 61  . Hyperlipidemia Father   . Diabetes Sister   . Hyperlipidemia Sister   . Hypertension Sister   . Diabetes Brother   . Heart disease Brother   . Hyperlipidemia Brother   . Hypertension Brother   . Breast cancer Sister     Social History   Tobacco Use  . Smoking status: Never Smoker  . Smokeless tobacco: Never Used  Substance Use Topics  . Alcohol use: Yes    Alcohol/week: 1.0 standard drinks    Types: 1 Glasses of wine per week    Comment: OCC  . Drug use: No    Home Medications Prior to Admission medications   Medication Sig Start Date End Date Taking? Authorizing Provider  aspirin EC 81 MG tablet Take 1 tablet (81 mg total) by mouth daily. 01/03/19   Baldo Daub, MD  Biotin 62035 MCG TABS Take by mouth.    [provider]  Cholecalciferol (VITAMIN D3 PO) Take 5,000 Units by mouth daily.    [provider]  cimetidine (TAGAMET) 200 MG tablet Take 200 mg by mouth daily as needed.    [provider]  diclofenac (VOLTAREN) 75 MG EC tablet Take 1 tablet (75 mg total) by mouth 2 (two) times daily. 11/07/18  Wallene Huh, DPM  Garlic 1610 MG CAPS Take by mouth.    [provider]  ibuprofen (ADVIL,MOTRIN) 200 MG tablet Take 400 mg by mouth every 6 (six) hours as needed for mild pain.    [provider]  KRILL OIL PO Take 750 mg by mouth daily.    [provider]  MAGNESIUM PO Take 250 mg by mouth daily.    [provider]  metFORMIN (GLUCOPHAGE) 500 MG tablet Take by mouth 2 (two) times daily with a meal.    [provider]  metoprolol succinate (TOPROL XL) 25 MG 24 hr tablet Take 1 tablet (25 mg total) by mouth daily. 01/03/19   Richardo Priest, MD  SUMAtriptan (IMITREX STATDOSE SYSTEM) 6 MG/0.5ML SOAJ Inject 6 mg into the skin as needed. 10/25/18   Marcial Pacas, MD  SUMAtriptan (IMITREX) 100 MG tablet Take 1 tab at onset of migraine.  May repeat in 2 hrs,  if needed.  Max dose: 2 tabs/day. This is a 90 day prescription. 10/25/18   Marcial Pacas, MD  venlafaxine XR (EFFEXOR-XR) 75 MG 24 hr capsule Take 1 capsule (75 mg total) by mouth daily with breakfast. 10/25/18   Marcial Pacas, MD    Allergies    Statins and Codeine  Review of Systems   Review of Systems  All other systems reviewed and are negative.   Physical Exam Updated Vital Signs BP 122/81   Pulse 94   Temp 100.2 F (37.9 C) (Oral)   Resp (!) 22   Ht 5\' 5"  (1.651 m)   Wt 108.9 kg   LMP 05/31/2011   SpO2 93%   BMI 39.94 kg/m   Physical Exam Vitals and nursing note reviewed. Exam conducted with a chaperone present.  Constitutional:      Appearance: Normal appearance.  HENT:     Head: Normocephalic and atraumatic.  Eyes:     General: No scleral icterus.    Conjunctiva/sclera: Conjunctivae normal.  Cardiovascular:     Rate and Rhythm: Normal rate and regular rhythm.     Pulses: Normal pulses.     Heart sounds: Normal heart sounds.  Pulmonary:     Effort: Pulmonary effort is normal. No respiratory distress.     Breath sounds: Normal breath sounds. No wheezing or rales.     Comments: Breath sounds intact bilaterally.  No accessory muscle use. Abdominal:     General: Abdomen is flat. There is no distension.     Palpations: Abdomen is soft. There is no mass.     Tenderness: There is no abdominal tenderness. There is no guarding.  Musculoskeletal:     Cervical back: Normal range of motion and neck supple. No rigidity.  Skin:    General: Skin is dry.     Capillary Refill: Capillary refill takes less than 2 seconds.  Neurological:     General: No focal deficit present.     Mental Status: She is alert and oriented to person, place, and time.     GCS: GCS eye subscore is 4. GCS verbal subscore is 5. GCS motor subscore is 6.     Cranial Nerves: No cranial nerve deficit.     Sensory: No sensory deficit.     Motor: No weakness.     Gait: Gait normal.  Psychiatric:         Mood and Affect: Mood normal.        Behavior: Behavior normal.        Thought  Content: Thought content normal.     ED Results / Procedures / Treatments   Labs (all labs ordered are listed, but only abnormal results are displayed) Labs Reviewed  RESPIRATORY PANEL BY RT PCR (FLU A&B, COVID) - Abnormal; Notable for the following components:      Result Value   SARS Coronavirus 2 by RT PCR POSITIVE (*)    All other components within normal limits  COMPREHENSIVE METABOLIC PANEL - Abnormal; Notable for the following components:   CO2 18 (*)    Glucose, Bld 198 (*)    Creatinine, Ser 1.02 (*)    Calcium 8.6 (*)    Albumin 3.1 (*)    All other components within normal limits  LACTIC ACID, PLASMA  CBC WITH DIFFERENTIAL/PLATELET  LACTIC ACID, PLASMA    EKG None  Radiology DG Chest Port 1 View  Result Date: 02/04/2019 CLINICAL DATA:  Low-grade fever EXAM: PORTABLE CHEST 1 VIEW COMPARISON:  January 03, 2019 FINDINGS: The mediastinal contour and cardiac silhouette are stable. Heart size is mildly enlarged. Patchy consolidation of bilateral mid lung are identified. There is no pleural effusion or pulmonary edema. The bony structures are stable. IMPRESSION: Patchy consolidation of bilateral mid lung suspicious for pneumonia. Electronically Signed   By: Sherian Rein M.D.   On: 02/04/2019 15:13    Procedures Procedures (including critical care time)  Medications Ordered in ED Medications  sodium chloride flush (NS) 0.9 % injection 3 mL (has no administration in time range)  ondansetron (ZOFRAN) injection 4 mg (4 mg Intravenous Given 02/04/19 1540)  acetaminophen (TYLENOL) tablet 650 mg (650 mg Oral Given 02/04/19 1657)  dexamethasone (DECADRON) injection 6 mg (6 mg Intravenous Given 02/04/19 1715)    ED Course  I have reviewed the triage vital signs and the nursing notes.  Pertinent labs & imaging results that were available during my care of the patient were reviewed by me and  considered in my medical decision making (see chart for details).    MDM Rules/Calculators/A&P                      DG chest demonstrates patchy consolidation of bilateral midlung suspicious for pneumonia.  Despite patient's reported diminished p.o. intake, will limit fluid resuscitation given pneumonia and likely positive COVID-19.  While Factor V Leiden, no lower leg swelling concerning for DVT.  Her vital signs are within normal limits and at this time I have low suspicion for pulmonary embolism.  She denies any pleuritic chest discomfort or pleuritic cough.  Discussed this with Dr. Freida Busman who agrees with decision not to obtain CTA at this time given her reassuring vital signs and lack of clinical evidence suggestive of DVT.  She takes aspirin 81 mg for her coagulopathy.  Respiratory panel came back and she is as expected positive for COVID-19.  Will treat with Decadron 6 mg IV here in the ED before p.o. challenging patient.  If she can tolerate food and liquid by mouth, she is likely stable for discharge at this time.  CMP revealed no electrolyte abnormalities.  CBC reassuring.  She has maintained oxygen saturation in the 90s throughout her stay in the ED, albeit with tachypnea approaching 30s.  While she endorses recent episodes of SVT, her EKG today was reassuring.  Isolation precautions provided to patient.  Patient to follow-up with her primary care provider, Cherylann Ratel NP, virtually for ongoing evaluation management.  I have also put in a referral for ongoing assistance for her  COVID-19 pneumonia, via Hospital-to-Home program.   Discussed very strict return precautions with the patient.  All of the evaluation and work-up results were discussed with the patient and any family at bedside. They were provided opportunity to ask any additional questions and have none at this time. They have expressed understanding of verbal discharge instructions as well as return precautions and are agreeable to  the plan.   UPDATE: Ambulated patient while on pulse oximetry prior to discharge and she desaturated to 89%.  She also became increasingly tachypneic to 36 respirations per minute while also tachycardic.  Will consult hospitalist for admission due to her new onset hypoxia and oxygen requirement in the setting of COVID-19 pneumonia.   Spoke with Dr. Luberta Robertson at 6:25pm who will now admit patient.  [name] was evaluated in Emergency Department on 02/04/2019 for the symptoms described in the history of present illness. She was evaluated in the context of the global COVID-19 pandemic, which necessitated consideration that the patient might be at risk for infection with the SARS-CoV-2 virus that causes COVID-19. Institutional protocols and algorithms that pertain to the evaluation of patients at risk for COVID-19 are in a state of rapid change based on information released by regulatory bodies including the CDC and federal and state organizations. These policies and algorithms were followed during the patient's care in the ED.   Final Clinical Impression(s) / ED Diagnoses Final diagnoses:  COVID-19    Rx / DC Orders ED Discharge Orders         Ordered    acetaminophen (TYLENOL) 500 MG tablet  Every 6 hours PRN,   Status:  Discontinued     02/04/19 1706    naproxen (NAPROSYN) 500 MG tablet  2 times daily,   Status:  Discontinued     02/04/19 1706    ondansetron (ZOFRAN ODT) 4 MG disintegrating tablet  Every 8 hours PRN,   Status:  Discontinued     02/04/19 1706           Lorelee New, PA-C 02/04/19 1732    Lorelee New, PA-C 02/04/19 1826    Little, Ambrose Finland, MD 02/05/19 [phone]

## 2019-02-04 NOTE — ED Notes (Signed)
Critical lab result. COVID 19 POSITIVE RESULT per microbiology.

## 2019-02-04 NOTE — ED Notes (Signed)
SpO2 between 94-99% while ambulating on pulse ox

## 2019-02-04 NOTE — H&P (Signed)
History and Physical:    Senaida Chilcote [facility]   ALP:[id number] DOB: 29-Mar-1960 DOA: 02/04/2019  Referring MD/provider: PA Chilton Si PCP: Elizabeth Palau, FNP   Patient coming from: Home  Chief Complaint: Fevers, body aches, shortness of breath  History of Present Illness:   [name] is an 59 y.o. female with PMH significant for factor V Leiden heterozygous state with history of PE/DVT in the past, DM 2, paroxysmal SVT and chronic migraines who was in her usual state of good health until 6 days ago when she woke up with body aches "like I got hit with a bat" and malaise.  She subsequently developed dyspnea on exertion and 3 days ago developed diarrhea 4-5 times a day, watery no blood.  Patient spoke with her PCP who told her to wait until today because she might have a false negative if she got Covid tested.  Patient stated she could barely get out of the house today so came to the ED.  Note she has not eaten very much in the past 6 days.  She feels that she is a little bit dizzy when she stands up but mostly is just fatigue.  No syncope.  Patient also notes an episode of SVT 3 days ago which resolved spontaneously and with Valsalva maneuver.  ED Course:  The patient was noted to have bilateral patchy infiltrates on chest x-ray.  Covid test is positive.  She was initially noted to have no oxygen requirement either at rest or with ambulation however subsequently she dropped down to 89% with ambulation but remained at 95% at rest.  Inflammatory markers have not been drawn and are now pending.  Electrolytes and CBC are within normal limits except for an elevated glucose at 198.  ROS:   ROS   Review of Systems: Eyes: Denies recent change in vision, no discharge, redness, pain noted GU: Denies dysuria, frequency or hematuria CNS: Denies HA, dizziness, confusion, new weakness or clumsiness. Musculoskeletal: Denies new back or joint pain Blood/lymphatics: Denies easy bruising or  bleeding Mood/affect: Denies anxiety/depression    Past Medical History:   Past Medical History:  Diagnosis Date  . Allergy   . Anxiety   . Blood clot in vein    right leg  . Chest tightness   . Depression   . Factor V deficiency (HCC)   . Hyperlipidemia   . Migraine   . Morbid obesity with BMI of 40.0-44.9, adult (HCC)   . Varicose vein     Past Surgical History:   Past Surgical History:  Procedure Laterality Date  . TUBAL LIGATION  2006  . VEIN LIGATION Bilateral     Social History:   Social History   Socioeconomic History  . Marital status: Divorced    Spouse name: Not on file  . Number of children: 0  . Years of education: 75  . Highest education level: Not on file  Occupational History  . Occupation: Engineering geologist: [employer]  Tobacco Use  . Smoking status: Never Smoker  . Smokeless tobacco: Never Used  Substance and Sexual Activity  . Alcohol use: Yes    Alcohol/week: 1.0 standard drinks    Types: 1 Glasses of wine per week    Comment: OCC  . Drug use: No  . Sexual activity: Not on file  Other Topics Concern  . Not on file  Social History Narrative   Patient is divorced. Patient has a high school education  and works for [employer].    Right handed.   Caffeine- two daily            Social Determinants of Health   Financial Resource Strain:   . Difficulty of Paying Living Expenses: Not on file  Food Insecurity:   . Worried About Programme researcher, broadcasting/film/video in the Last Year: Not on file  . Ran Out of Food in the Last Year: Not on file  Transportation Needs:   . Lack of Transportation (Medical): Not on file  . Lack of Transportation (Non-Medical): Not on file  Physical Activity:   . Days of Exercise per Week: Not on file  . Minutes of Exercise per Session: Not on file  Stress:   . Feeling of Stress : Not on file  Social Connections:   . Frequency of Communication with Friends and Family: Not on file  . Frequency  of Social Gatherings with Friends and Family: Not on file  . Attends Religious Services: Not on file  . Active Member of Clubs or Organizations: Not on file  . Attends Banker Meetings: Not on file  . Marital Status: Not on file  Intimate Partner Violence:   . Fear of Current or Ex-Partner: Not on file  . Emotionally Abused: Not on file  . Physically Abused: Not on file  . Sexually Abused: Not on file    Allergies   Statins and Codeine  Family history:   Family History  Problem Relation Age of Onset  . Diabetes Mother 40       type 2  . Diabetes Father 21       type 2  . Dementia Father 43  . Hyperlipidemia Father   . Diabetes Sister   . Hyperlipidemia Sister   . Hypertension Sister   . Diabetes Brother   . Heart disease Brother   . Hyperlipidemia Brother   . Hypertension Brother   . Breast cancer Sister     Current Medications:   Prior to Admission medications   Medication Sig Start Date End Date Taking? Authorizing Provider  aspirin EC 81 MG tablet Take 1 tablet (81 mg total) by mouth daily. 01/03/19   Baldo Daub, MD  Biotin 70017 MCG TABS Take by mouth.    [provider]  Cholecalciferol (VITAMIN D3 PO) Take 5,000 Units by mouth daily.    [provider]  cimetidine (TAGAMET) 200 MG tablet Take 200 mg by mouth daily as needed.    [provider]  diclofenac (VOLTAREN) 75 MG EC tablet Take 1 tablet (75 mg total) by mouth 2 (two) times daily. 11/07/18   Lenn Sink, DPM  Garlic 1000 MG CAPS Take by mouth.    [provider]  ibuprofen (ADVIL,MOTRIN) 200 MG tablet Take 400 mg by mouth every 6 (six) hours as needed for mild pain.    [provider]  KRILL OIL PO Take 750 mg by mouth daily.    [provider]  MAGNESIUM PO Take 250 mg by mouth daily.    [provider]  metFORMIN (GLUCOPHAGE) 500 MG tablet Take by mouth 2 (two) times daily with a meal.    [provider]   metoprolol succinate (TOPROL XL) 25 MG 24 hr tablet Take 1 tablet (25 mg total) by mouth daily. 01/03/19   Baldo Daub, MD  SUMAtriptan (IMITREX STATDOSE SYSTEM) 6 MG/0.5ML SOAJ Inject 6 mg into the skin as needed. 10/25/18   Levert Feinstein,  MD  SUMAtriptan (IMITREX) 100 MG tablet Take 1 tab at onset of migraine.  May repeat in 2 hrs, if needed.  Max dose: 2 tabs/day. This is a 90 day prescription. 10/25/18   Levert Feinstein, MD  venlafaxine XR (EFFEXOR-XR) 75 MG 24 hr capsule Take 1 capsule (75 mg total) by mouth daily with breakfast. 10/25/18   Levert Feinstein, MD    Physical Exam:   Vitals:   02/04/19 1457 02/04/19 1530 02/04/19 1730 02/04/19 1815  BP: 135/82 122/81    Pulse: 88 95 (!) 108 94  Resp: 16 (!) 25 19 (!) 22  Temp: 100.2 F (37.9 C)     TempSrc: Oral     SpO2: 97% 98% 96% 93%  Weight:      Height:         Physical Exam: Blood pressure 122/81, pulse 94, temperature 100.2 F (37.9 C), temperature source Oral, resp. rate (!) 22, height 5\' 5"  (1.651 m), weight 108.9 kg, last menstrual period 05/31/2011, SpO2 93 %. Gen: Unwell appearing female lying at 20 degrees with tachypnea but no labored breathing able to speak in full sentences without difficulty. Eyes: sclera anicteric, conjuctiva clear, no lid lag, no exophthalmos, EOMI CVS: S1-S2 clear, no murmur rubs or gallops, no LE edema, normal pedal pulses  Respiratory:  normal effort, symmetrical excursion, CTA without adventitious sounds.  GI: NABS, soft, NT, ND, no palpable masses.  LE: No edema. No cyanosis Neuro: A/O x 3, Moving all extremities equally with normal strength, CN 3-12 intact, grossly nonfocal.  Psych: patient is logical and coherent, judgement and insight appear normal, mood and affect appropriate to situation. Skin: no rashes or lesions or ulcers,    Data Review:    Labs: Basic Metabolic Panel: Recent Labs  Lab 02/04/19 0938  NA 137  K 4.0  CL 105  CO2 18*  GLUCOSE 198*  BUN 9  CREATININE 1.02*   CALCIUM 8.6*   Liver Function Tests: Recent Labs  Lab 02/04/19 0938  AST 34  ALT 26  ALKPHOS 79  BILITOT 0.6  PROT 7.1  ALBUMIN 3.1*   No results for input(s): LIPASE, AMYLASE in the last 168 hours. No results for input(s): AMMONIA in the last 168 hours. CBC: Recent Labs  Lab 02/04/19 0938  WBC 5.3  NEUTROABS 4.0  HGB 12.9  HCT 40.4  MCV 84.7  PLT 203   Cardiac Enzymes: No results for input(s): CKTOTAL, CKMB, CKMBINDEX, TROPONINI in the last 168 hours.  BNP (last 3 results) No results for input(s): PROBNP in the last 8760 hours. CBG: No results for input(s): GLUCAP in the last 168 hours.  Urinalysis    Component Value Date/Time   BILIRUBINUR neg 12/25/2012 1319   PROTEINUR neg 12/25/2012 1319   UROBILINOGEN 0.2 12/25/2012 1319   NITRITE neg 12/25/2012 1319   LEUKOCYTESUR moderate (2+) 12/25/2012 1319      Radiographic Studies: DG Chest Port 1 View  Result Date: 02/04/2019 CLINICAL DATA:  Low-grade fever EXAM: PORTABLE CHEST 1 VIEW COMPARISON:  January 03, 2019 FINDINGS: The mediastinal contour and cardiac silhouette are stable. Heart size is mildly enlarged. Patchy consolidation of bilateral mid lung are identified. There is no pleural effusion or pulmonary edema. The bony structures are stable. IMPRESSION: Patchy consolidation of bilateral mid lung suspicious for pneumonia. Electronically Signed   By: January 05, 2019 M.D.   On: 02/04/2019 15:13    EKG: Independently reviewed.  Sinus rhythm at 90.  Normal intervals.  Normal axis.  Isolated  Q waves with T wave inversion in 3.  Has minimal T wave inversions V1 through V3.   Assessment/Plan:   Principal Problem:   Pneumonia due to COVID-19 virus Active Problems:   Factor 5 Leiden mutation, heterozygous (HCC)   Diabetes ([place])   Chronic migraine   Paroxysmal SVT (supraventricular tachycardia) (HCC)   History of adverse effect of venous thromboembolism (VTE) prophylaxis  59 year old female with known  history of VTE/factor V Leiden heterozygosity is admitted for Covid pneumonia.  At present she has no oxygen requirement at rest however does desat to 89% on ambulation.   COVID PNEUMONIA At present patient has no hypoxia at rest however did drop down to 89% with ambulation. She looks and feels quite miserable. We will treat with remdesivir day #1 of 5  Dexamethasone day #1 of 10 Inhaled bronchodilators Combivent 4 times daily standing Oxygen as needed CRP is ordered and pending, will follow daily  DIARRHEA Patient has had 4-5 episodes of diarrhea for the past 3 days. He is quite thirsty and a little bit dizzy when getting up. We will give her a small bolus of NS 250 cc X 1 now.   H/O VTE WITH HETEROZYGOUS FACTOR V LEIDEN We will place patient on moderate dose VTE prophylaxis with Lovenox 40 twice daily D-dimer is ordered and pending, will follow daily  DM Hold Metformin Sliding scale insulin moderate dose AC at bedtime Would expect a fair elevation in blood sugars given steroid use Carb controlled diet  CHRONIC MIGRAINES Continue venlafaxine for chronic migraines I have not continued her Imitrex, these can be ordered as warranted in house  PAROXYSMAL SVT Continue metoprolol for paroxysmal SVT Telemetry monitoring    Other information:   DVT prophylaxis: Lovenox 40 twice daily, moderate dose ordered. Code Status: Full Family Communication: Patient states it is not necessary to call anyone Disposition Plan: Home Consults called: None Admission status: Inpatient  [facility]  If 7PM-7AM, please contact night-coverage [email or web address] Password [facility] 02/04/2019, 6:44 PM

## 2019-02-04 NOTE — ED Triage Notes (Signed)
Pt reports not feeling well since Wednesday. Has been having low grade fever, tmax 100.5, denies recent cough, sick contacts, smoking, drug use. Denies abdominal pain, dysuria. Reports some SOB with exertion. Pt having nausea/vomiting/diarrhea, loss of taste and smell. cbg 189. VSS. Pt alert, oriented x4, NAD at present.

## 2019-02-04 NOTE — ED Notes (Signed)
Pt desat to 89% RA with ambulation. Provider notified.

## 2019-02-05 ENCOUNTER — Inpatient Hospital Stay (HOSPITAL_COMMUNITY): Payer: 59

## 2019-02-05 LAB — COMPREHENSIVE METABOLIC PANEL
ALT: 21 U/L (ref 0–44)
AST: 25 U/L (ref 15–41)
Albumin: 2.6 g/dL — ABNORMAL LOW (ref 3.5–5.0)
Alkaline Phosphatase: 70 U/L (ref 38–126)
Anion gap: 11 (ref 5–15)
BUN: 13 mg/dL (ref 6–20)
CO2: 20 mmol/L — ABNORMAL LOW (ref 22–32)
Calcium: 8.3 mg/dL — ABNORMAL LOW (ref 8.9–10.3)
Chloride: 109 mmol/L (ref 98–111)
Creatinine, Ser: 0.98 mg/dL (ref 0.44–1.00)
GFR calc Af Amer: >60 mL/min (ref >60)
GFR calc non Af Amer: >60 mL/min (ref >60)
Glucose, Bld: 213 mg/dL — ABNORMAL HIGH (ref 70–99)
Potassium: 4.4 mmol/L (ref 3.5–5.1)
Sodium: 140 mmol/L (ref 135–145)
Total Bilirubin: 0.4 mg/dL (ref 0.3–1.2)
Total Protein: 6.3 g/dL — ABNORMAL LOW (ref 6.5–8.1)

## 2019-02-05 LAB — GLUCOSE, CAPILLARY
Glucose-Capillary: 193 mg/dL — ABNORMAL HIGH (ref 70–99)
Glucose-Capillary: 169 mg/dL — ABNORMAL HIGH (ref 70–99)
Glucose-Capillary: 212 mg/dL — ABNORMAL HIGH (ref 70–99)
Glucose-Capillary: 224 mg/dL — ABNORMAL HIGH (ref 70–99)
Glucose-Capillary: 227 mg/dL — ABNORMAL HIGH (ref 70–99)

## 2019-02-05 LAB — CBC WITH DIFFERENTIAL/PLATELET
Abs Immature Granulocytes: 0.02 10*3/uL (ref 0.00–0.07)
Basophils Absolute: 0 10*3/uL (ref 0.0–0.1)
Basophils Relative: 0 %
Eosinophils Absolute: 0 10*3/uL (ref 0.0–0.5)
Eosinophils Relative: 0 %
HCT: 37.7 % (ref 36.0–46.0)
Hemoglobin: 12.1 g/dL (ref 12.0–15.0)
Immature Granulocytes: 0 %
Lymphocytes Relative: 15 %
Lymphs Abs: 0.8 10*3/uL (ref 0.7–4.0)
MCH: 27.3 pg (ref 26.0–34.0)
MCHC: 32.1 g/dL (ref 30.0–36.0)
MCV: 85.1 fL (ref 80.0–100.0)
Monocytes Absolute: 0.4 10*3/uL (ref 0.1–1.0)
Monocytes Relative: 8 %
Neutro Abs: 3.9 10*3/uL (ref 1.7–7.7)
Neutrophils Relative %: 77 %
Platelets: 187 10*3/uL (ref 150–400)
RBC: 4.43 MIL/uL (ref 3.87–5.11)
RDW: 14.5 % (ref 11.5–15.5)
WBC: 5.1 10*3/uL (ref 4.0–10.5)
nRBC: 0 % (ref 0.0–0.2)

## 2019-02-05 LAB — D-DIMER, QUANTITATIVE
D-Dimer, Quant: 1.93 ug/mL-FEU — ABNORMAL HIGH (ref 0.00–0.50)
D-Dimer, Quant: 2.19 ug/mL-FEU — ABNORMAL HIGH (ref 0.00–0.50)

## 2019-02-05 LAB — LACTIC ACID, PLASMA: Lactic Acid, Venous: 0.9 mmol/L (ref 0.5–1.9)

## 2019-02-05 LAB — PROCALCITONIN: Procalcitonin: <0.1 ng/mL

## 2019-02-05 LAB — CREATININE, SERUM
Creatinine, Ser: 1.02 mg/dL — ABNORMAL HIGH (ref 0.44–1.00)
GFR calc Af Amer: >60 mL/min (ref >60)
GFR calc non Af Amer: >60 mL/min (ref >60)

## 2019-02-05 LAB — HIV ANTIBODY (ROUTINE TESTING W REFLEX): HIV Screen 4th Generation wRfx: NONREACTIVE

## 2019-02-05 LAB — C-REACTIVE PROTEIN
CRP: 9.1 mg/dL — ABNORMAL HIGH (ref <1.0)
CRP: 8.3 mg/dL — ABNORMAL HIGH (ref <1.0)

## 2019-02-05 LAB — ABO/RH: ABO/RH(D): A POS

## 2019-02-05 MED ORDER — IOHEXOL 350 MG/ML SOLN
75.0000 mL | Freq: Once | INTRAVENOUS | Status: AC | PRN
  Administered 2019-02-05: 75 mL via INTRAVENOUS

## 2019-02-05 MED ORDER — INSULIN GLARGINE 100 UNIT/ML ~~LOC~~ SOLN
10.0000 [IU] | Freq: Every day | SUBCUTANEOUS | Status: DC
  Administered 2019-02-05 – 2019-02-07 (×3): 10 [IU] via SUBCUTANEOUS
  Filled 2019-02-05 (×5): qty 0.1

## 2019-02-05 NOTE — Progress Notes (Signed)
PROGRESS NOTE    Limon  PPI:[id number] DOB: 05/03/1960 DOA: 02/04/2019 PCP: Vicenta Aly, FNP   Brief Narrative:  [name] is an 59 y.o. female with PMH significant for factor V Leiden heterozygous state with history of PE/DVT in the past, DM 2, paroxysmal SVT and chronic migraines who was in her usual state of good health until 6 days ago when she woke up with body aches "like I got hit with a bat" and malaise.  She subsequently developed dyspnea on exertion and 3 days ago developed diarrhea 4-5 times a day, watery no blood. She was Covid positive with chest x-ray showing bilateral patchy infiltrate.  Subjective: Patient was feeling better when seen this morning.  She does become hypoxic while talking.  At rest saturating in the low 90s on room air.  Assessment & Plan:   Principal Problem:   Pneumonia due to COVID-19 virus Active Problems:   Factor 5 Leiden mutation, heterozygous ([place])   Diabetes ([place])   Chronic migraine   Paroxysmal SVT (supraventricular tachycardia) (HCC)   History of adverse effect of venous thromboembolism (VTE) prophylaxis  COVID PNEUMONIA.  COVID-19 Labs  Recent Labs    02/04/19 2249 02/05/19 0634  DDIMER 1.93* 2.19*  CRP 9.1* 8.3*    Lab Results  Component Value Date   SARSCOV2NAA POSITIVE (A) 02/04/2019   Patient was desaturating to high 80s while talking-placed on 2L. Clinically feeling better. -Inflammatory markers elevated.PCT normal limit. -Continue with remdesivir and dexamethasone. -Continue Inhaled bronchodilators Combivent 4 times daily standing Oxygen as needed.  Diarrhea.  Improved today.  H/O VTE WITH HETEROZYGOUS FACTOR V LEIDEN Patient was placed on on moderate dose VTE prophylaxis with Lovenox 40 twice daily.  D-dimer elevated at 2.19 today. -We will check CTA due to worsening hypoxia and D-dimer.  Diabetes.  No recent A1c. CBG trending up as patient is on steroid. -Check A1c. -Add Lantus 10 units at  bedtime. -Continue with SSI  CHRONIC MIGRAINES.  No acute concern. Continue venlafaxine for chronic migraines.  PAROXYSMAL SVT Continue metoprolol for paroxysmal SVT Telemetry monitoring.  Objective: Vitals:   02/04/19 2230 02/05/19 0000 02/05/19 0543 02/05/19 1354  BP: 117/63 114/77 94/66 (!) 108/97  Pulse: 69  63 70  Resp: (!) 22 19 19 20   Temp: 98.3 F (36.8 C)  97.7 F (36.5 C) 98 F (36.7 C)  TempSrc: Oral  Oral Oral  SpO2: 93%  93% 92%  Weight: 108.6 kg     Height: 5\' 5"  (1.651 m)      No intake or output data in the 24 hours ending 02/05/19 1731 Filed Weights   02/04/19 0936 02/04/19 2230  Weight: 108.9 kg 108.6 kg    Examination:  General exam: Appears calm and comfortable  Respiratory system: Clear to auscultation. Respiratory effort normal. Cardiovascular system: S1 & S2 heard, RRR. No JVD, murmurs, rubs, gallops or clicks. Gastrointestinal system: Soft, nontender, nondistended, bowel sounds positive. Central nervous system: Alert and oriented. No focal neurological deficits.Symmetric 5 x 5 power. Extremities: No edema, no cyanosis, pulses intact and symmetrical. Skin: No rashes, lesions or ulcers Psychiatry: Judgement and insight appear normal. Mood & affect appropriate.    DVT prophylaxis: Lovenox Code Status: Full Family Communication: No family at bedside Disposition Plan: Pending improvement, most likely home after that.  Consultants:   None  Procedures:  Antimicrobials:  None  Data Reviewed: I have personally reviewed following labs and imaging studies  CBC: Recent Labs  Lab 02/04/19 0938 02/04/19  2249 02/05/19 0634  WBC 5.3 4.2 5.1  NEUTROABS 4.0  --  3.9  HGB 12.9 11.6* 12.1  HCT 40.4 35.8* 37.7  MCV 84.7 84.6 85.1  PLT 203 205 187   Basic Metabolic Panel: Recent Labs  Lab 02/04/19 0938 02/04/19 2249 02/05/19 0634  NA 137  --  140  K 4.0  --  4.4  CL 105  --  109  CO2 18*  --  20*  GLUCOSE 198*  --  213*  BUN 9  --   13  CREATININE 1.02* 1.02* 0.98  CALCIUM 8.6*  --  8.3*   GFR: Estimated Creatinine Clearance: 75.7 mL/min (by C-G formula based on SCr of 0.98 mg/dL). Liver Function Tests: Recent Labs  Lab 02/04/19 0938 02/05/19 0634  AST 34 25  ALT 26 21  ALKPHOS 79 70  BILITOT 0.6 0.4  PROT 7.1 6.3*  ALBUMIN 3.1* 2.6*   No results for input(s): LIPASE, AMYLASE in the last 168 hours. No results for input(s): AMMONIA in the last 168 hours. Coagulation Profile: No results for input(s): INR, PROTIME in the last 168 hours. Cardiac Enzymes: No results for input(s): CKTOTAL, CKMB, CKMBINDEX, TROPONINI in the last 168 hours. BNP (last 3 results) No results for input(s): PROBNP in the last 8760 hours. HbA1C: No results for input(s): HGBA1C in the last 72 hours. CBG: Recent Labs  Lab 02/05/19 0700 02/05/19 0749 02/05/19 1224 02/05/19 1642  GLUCAP 193* 169* 212* 224*   Lipid Profile: No results for input(s): CHOL, HDL, LDLCALC, TRIG, CHOLHDL, LDLDIRECT in the last 72 hours. Thyroid Function Tests: No results for input(s): TSH, T4TOTAL, FREET4, T3FREE, THYROIDAB in the last 72 hours. Anemia Panel: No results for input(s): VITAMINB12, FOLATE, FERRITIN, TIBC, IRON, RETICCTPCT in the last 72 hours. Sepsis Labs: Recent Labs  Lab 02/04/19 1005 02/04/19 2249  PROCALCITON  --  <0.10  LATICACIDVEN 1.1 0.9    Recent Results (from the past 240 hour(s))  Respiratory Panel by RT PCR (Flu A&B, Covid) - Nasopharyngeal Swab     Status: Abnormal   Collection Time: 02/04/19  2:54 PM   Specimen: Nasopharyngeal Swab  Result Value Ref Range Status   SARS Coronavirus 2 by RT PCR POSITIVE (A) NEGATIVE Final    Comment: RESULT CALLED TO, READ BACK BY AND VERIFIED WITH: Celedonio Savage RN 16:45 02/04/19 ([email or web address]) (NOTE) SARS-CoV-2 target nucleic acids are DETECTED. SARS-CoV-2 RNA is generally detectable in upper respiratory specimens  during the acute phase of infection. Positive results are indicative of the  presence of the identified virus, but do not rule out bacterial infection or co-infection with other pathogens not detected by the test. Clinical correlation with patient history and other diagnostic information is necessary to determine patient infection status. The expected result is Negative. Fact Sheet for Patients:  https://www.moore.com/ Fact Sheet for Healthcare Providers: https://www.young.biz/ This test is not yet approved or cleared by the [place] FDA and  has been authorized for detection and/or diagnosis of SARS-CoV-2 by FDA under an Emergency Use Authorization (EUA).  This EUA will remain in effect (meaning this test can be used) f or the duration of  the COVID-19 declaration under Section 564(b)(1) of the Act, 21 U.S.C. section 360bbb-3(b)(1), unless the authorization is terminated or revoked sooner.    Influenza A by PCR NEGATIVE NEGATIVE Final   Influenza B by PCR NEGATIVE NEGATIVE Final    Comment: (NOTE) The Xpert Xpress SARS-CoV-2/FLU/RSV assay is intended as an aid in  the diagnosis of influenza  from Nasopharyngeal swab specimens and  should not be used as a sole basis for treatment. Nasal washings and  aspirates are unacceptable for Xpert Xpress SARS-CoV-2/FLU/RSV  testing. Fact Sheet for Patients: https://www.moore.com/ Fact Sheet for Healthcare Providers: https://www.young.biz/ This test is not yet approved or cleared by the [place] FDA and  has been authorized for detection and/or diagnosis of SARS-CoV-2 by  FDA under an Emergency Use Authorization (EUA). This EUA will remain  in effect (meaning this test can be used) for the duration of the  Covid-19 declaration under Section 564(b)(1) of the Act, 21  U.S.C. section 360bbb-3(b)(1), unless the authorization is  terminated or revoked. Performed at [facility], [address] [place], [place] [address]       Radiology Studies: DG Chest Port 1 View  Result Date: 02/04/2019 CLINICAL DATA:  Low-grade fever EXAM: PORTABLE CHEST 1 VIEW COMPARISON:  January 03, 2019 FINDINGS: The mediastinal contour and cardiac silhouette are stable. Heart size is mildly enlarged. Patchy consolidation of bilateral mid lung are identified. There is no pleural effusion or pulmonary edema. The bony structures are stable. IMPRESSION: Patchy consolidation of bilateral mid lung suspicious for pneumonia. Electronically Signed   By: Sherian Rein M.D.   On: 02/04/2019 15:13    Scheduled Meds: . aspirin EC  81 mg Oral Daily  . dexamethasone (DECADRON) injection  6 mg Intravenous Q24H  . enoxaparin (LOVENOX) injection  40 mg Subcutaneous Q12H  . insulin aspart  0-15 Units Subcutaneous TID WC  . Ipratropium-Albuterol  1 puff Inhalation QID  . metoprolol succinate  25 mg Oral Daily  . venlafaxine XR  75 mg Oral Q breakfast   Continuous Infusions: . remdesivir 100 mg in NS 100 mL       LOS: 1 day   Time spent: 40 minutes  Arnetha Courser, MD [facility] Pager [phone]  If 7PM-7AM, please contact night-coverage [email or web address] 02/05/2019, 5:31 PM   This record has been created using Conservation officer, historic buildings. Errors have been sought and corrected,but may not always be located. Such creation errors do not reflect on the standard of care.

## 2019-02-06 LAB — COMPREHENSIVE METABOLIC PANEL
ALT: 17 U/L (ref 0–44)
AST: 21 U/L (ref 15–41)
Albumin: 2.5 g/dL — ABNORMAL LOW (ref 3.5–5.0)
Alkaline Phosphatase: 65 U/L (ref 38–126)
Anion gap: 12 (ref 5–15)
BUN: 20 mg/dL (ref 6–20)
CO2: 23 mmol/L (ref 22–32)
Calcium: 8.5 mg/dL — ABNORMAL LOW (ref 8.9–10.3)
Chloride: 105 mmol/L (ref 98–111)
Creatinine, Ser: 0.87 mg/dL (ref 0.44–1.00)
GFR calc Af Amer: >60 mL/min (ref >60)
GFR calc non Af Amer: >60 mL/min (ref >60)
Glucose, Bld: 196 mg/dL — ABNORMAL HIGH (ref 70–99)
Potassium: 4.4 mmol/L (ref 3.5–5.1)
Sodium: 140 mmol/L (ref 135–145)
Total Bilirubin: 0.4 mg/dL (ref 0.3–1.2)
Total Protein: 5.9 g/dL — ABNORMAL LOW (ref 6.5–8.1)

## 2019-02-06 LAB — HEMOGLOBIN A1C
Hgb A1c MFr Bld: 7.5 % — ABNORMAL HIGH (ref 4.8–5.6)
Mean Plasma Glucose: 168.55 mg/dL

## 2019-02-06 LAB — CBC WITH DIFFERENTIAL/PLATELET
Abs Immature Granulocytes: 0.04 10*3/uL (ref 0.00–0.07)
Basophils Absolute: 0 10*3/uL (ref 0.0–0.1)
Basophils Relative: 0 %
Eosinophils Absolute: 0 10*3/uL (ref 0.0–0.5)
Eosinophils Relative: 0 %
HCT: 37.7 % (ref 36.0–46.0)
Hemoglobin: 11.9 g/dL — ABNORMAL LOW (ref 12.0–15.0)
Immature Granulocytes: 1 %
Lymphocytes Relative: 20 %
Lymphs Abs: 1.2 10*3/uL (ref 0.7–4.0)
MCH: 27.1 pg (ref 26.0–34.0)
MCHC: 31.6 g/dL (ref 30.0–36.0)
MCV: 85.9 fL (ref 80.0–100.0)
Monocytes Absolute: 0.6 10*3/uL (ref 0.1–1.0)
Monocytes Relative: 10 %
Neutro Abs: 4.1 10*3/uL (ref 1.7–7.7)
Neutrophils Relative %: 69 %
Platelets: 262 10*3/uL (ref 150–400)
RBC: 4.39 MIL/uL (ref 3.87–5.11)
RDW: 14.6 % (ref 11.5–15.5)
WBC: 5.9 10*3/uL (ref 4.0–10.5)
nRBC: 0 % (ref 0.0–0.2)

## 2019-02-06 LAB — GLUCOSE, CAPILLARY
Glucose-Capillary: 159 mg/dL — ABNORMAL HIGH (ref 70–99)
Glucose-Capillary: 182 mg/dL — ABNORMAL HIGH (ref 70–99)
Glucose-Capillary: 261 mg/dL — ABNORMAL HIGH (ref 70–99)

## 2019-02-06 LAB — C-REACTIVE PROTEIN: CRP: 4.4 mg/dL — ABNORMAL HIGH (ref <1.0)

## 2019-02-06 LAB — D-DIMER, QUANTITATIVE: D-Dimer, Quant: 1.48 ug/mL-FEU — ABNORMAL HIGH (ref 0.00–0.50)

## 2019-02-06 MED ORDER — METHYLPREDNISOLONE SODIUM SUCC 40 MG IJ SOLR
40.0000 mg | Freq: Three times a day (TID) | INTRAMUSCULAR | Status: DC
  Administered 2019-02-06 – 2019-02-08 (×7): 40 mg via INTRAVENOUS
  Filled 2019-02-06 (×7): qty 1

## 2019-02-06 MED ORDER — ASPIRIN 325 MG PO TABS
325.0000 mg | ORAL_TABLET | Freq: Every day | ORAL | Status: DC
  Administered 2019-02-06 – 2019-02-08 (×3): 325 mg via ORAL
  Filled 2019-02-06 (×3): qty 1

## 2019-02-06 MED ORDER — IPRATROPIUM-ALBUTEROL 20-100 MCG/ACT IN AERS
1.0000 | INHALATION_SPRAY | Freq: Four times a day (QID) | RESPIRATORY_TRACT | Status: DC
  Administered 2019-02-06 – 2019-02-08 (×9): 1 via RESPIRATORY_TRACT
  Filled 2019-02-06: qty 4

## 2019-02-06 MED ORDER — ZINC SULFATE 220 (50 ZN) MG PO CAPS
220.0000 mg | ORAL_CAPSULE | Freq: Every day | ORAL | Status: DC
  Administered 2019-02-06 – 2019-02-08 (×3): 220 mg via ORAL
  Filled 2019-02-06 (×3): qty 1

## 2019-02-06 MED ORDER — SODIUM CHLORIDE 0.9 % IV SOLN
100.0000 mg | Freq: Every day | INTRAVENOUS | Status: DC
  Administered 2019-02-06 – 2019-02-08 (×3): 100 mg via INTRAVENOUS
  Filled 2019-02-06 (×3): qty 20

## 2019-02-06 MED ORDER — IPRATROPIUM-ALBUTEROL 20-100 MCG/ACT IN AERS: 1.0000 | INHALATION_SPRAY | Freq: Four times a day (QID) | RESPIRATORY_TRACT | Status: DC | PRN

## 2019-02-06 MED ORDER — PANTOPRAZOLE SODIUM 40 MG PO TBEC
40.0000 mg | DELAYED_RELEASE_TABLET | Freq: Every day | ORAL | Status: DC
  Administered 2019-02-07 – 2019-02-08 (×2): 40 mg via ORAL
  Filled 2019-02-06 (×2): qty 1

## 2019-02-06 MED ORDER — ASCORBIC ACID 500 MG PO TABS
500.0000 mg | ORAL_TABLET | Freq: Every day | ORAL | Status: DC
  Administered 2019-02-06 – 2019-02-08 (×3): 500 mg via ORAL
  Filled 2019-02-06 (×3): qty 1

## 2019-02-06 NOTE — Progress Notes (Signed)
PROGRESS NOTE    [name] [facility]  ZOX:[id number] DOB: April 07, 1960 DOA: 02/04/2019 PCP: Elizabeth Palau, FNP   Brief Narrative:  59 y.o.femalewith PMH significant for factor V Leiden heterozygous state with history of PE/DVT in the past, DM 2, paroxysmal SVT and chronic migraines who was in her usual state of good health until 6 days ago when she woke up with body aches "like I got hit with a bat" and malaise. She subsequently developed dyspnea on exertion and 3 days ago developed diarrhea 4-5 times a day, watery no blood. She was Covid positive with chest x-ray showing bilateral patchy infiltrate.   Assessment & Plan:   Principal Problem:   Pneumonia due to COVID-19 virus Active Problems:   Factor 5 Leiden mutation, heterozygous (HCC)   Diabetes (HCC)   Chronic migraine   Paroxysmal SVT (supraventricular tachycardia) (HCC)   History of adverse effect of venous thromboembolism (VTE) prophylaxis  Acute hypoxia with COVID-19 pneumonia -Oxygen levels-2-3 L nasal cannula -Remdesivir-day 3 -Solu-Medrol-day 3 -Routine: Labs have been reviewed including ferritin, LDH, CRP, d-dimer, fibrinogen.  Will need to trend this lab daily. -Vitamin C & Zinc. Prone >16hrs/day.  -procalcitonin-negative -Chest x-ray-bilateral infiltrates -Supportive care-antitussive, inhalers, I-S/flutter -CODE STATUS confirmed  History of heterozygous for factor V Leiden -On daily aspirin 325 mg. -Advised to be on daily PPI AC  Diabetes mellitus type 2, uncontrolled due to hyperglycemia from steroids -Lantus 10 units at bedtime.  Insulin sliding scale and Accu-Chek -A1c 7.5  Chronic migraines -Continue home meds  Paroxysmal SVT -Metoprolol    DVT prophylaxis: Lovenox every 12 hours Code Status: Full code Family Communication: None Disposition Plan: Maintain hospital stay until her hypoxia is improved.  She is not on any home oxygen.  Consultants:   None  Procedures:   None  Antimicrobials:    None   Subjective: Still having quite a bit of exertional dyspnea with minimal movement.  Tells me she is very afraid every time she gets short of breath.  During my visit her rash felt okay.  Review of Systems Otherwise negative except as per HPI, including: General: Denies fever, chills, night sweats or unintended weight loss. Resp: Denies cough, wheezing Cardiac: Denies chest pain, palpitations, orthopnea, paroxysmal nocturnal dyspnea. GI: Denies abdominal pain, nausea, vomiting, diarrhea or constipation GU: Denies dysuria, frequency, hesitancy or incontinence MS: Denies muscle aches, joint pain or swelling Neuro: Denies headache, neurologic deficits (focal weakness, numbness, tingling), abnormal gait Psych: Denies anxiety, depression, SI/HI/AVH Skin: Denies new rashes or lesions ID: Denies sick contacts, exotic exposures, travel  Objective: Vitals:   02/05/19 2000 02/06/19 0426 02/06/19 0828 02/06/19 0841  BP: 125/74 117/76 114/65 114/65  Pulse: 67  61 66  Resp: (!) 21  20 17   Temp: 98.1 F (36.7 C) 97.9 F (36.6 C) 98.3 F (36.8 C)   TempSrc: Oral Oral Oral   SpO2: 93%  95% 93%  Weight:      Height:        Intake/Output Summary (Last 24 hours) at 02/06/2019 1217 Last data filed at 02/06/2019 0400 Gross per 24 hour  Intake 100 ml  Output --  Net 100 ml   Filed Weights   02/04/19 0936 02/04/19 2230  Weight: 108.9 kg 108.6 kg    Examination:  General exam: Appears calm and comfortable  Respiratory system: Mild bilateral rhonchi Cardiovascular system: S1 & S2 heard, RRR. No JVD, murmurs, rubs, gallops or clicks. No pedal edema. Gastrointestinal system: Abdomen is nondistended, soft and nontender. No organomegaly  or masses felt. Normal bowel sounds heard. Central nervous system: Alert and oriented. No focal neurological deficits. Extremities: Symmetric 5 x 5 power. Skin: No rashes, lesions or ulcers Psychiatry: Judgement and insight appear normal. Mood &  affect appropriate.     Data Reviewed:   CBC: Recent Labs  Lab 02/04/19 0938 02/04/19 2249 02/05/19 0634 02/06/19 0500  WBC 5.3 4.2 5.1 5.9  NEUTROABS 4.0  --  3.9 4.1  HGB 12.9 11.6* 12.1 11.9*  HCT 40.4 35.8* 37.7 37.7  MCV 84.7 84.6 85.1 85.9  PLT 203 205 187 262   Basic Metabolic Panel: Recent Labs  Lab 02/04/19 0938 02/04/19 2249 02/05/19 0634 02/06/19 0500  NA 137  --  140 140  K 4.0  --  4.4 4.4  CL 105  --  109 105  CO2 18*  --  20* 23  GLUCOSE 198*  --  213* 196*  BUN 9  --  13 20  CREATININE 1.02* 1.02* 0.98 0.87  CALCIUM 8.6*  --  8.3* 8.5*   GFR: Estimated Creatinine Clearance: 85.3 mL/min (by C-G formula based on SCr of 0.87 mg/dL). Liver Function Tests: Recent Labs  Lab 02/04/19 0938 02/05/19 0634 02/06/19 0500  AST 34 25 21  ALT 26 21 17   ALKPHOS 79 70 65  BILITOT 0.6 0.4 0.4  PROT 7.1 6.3* 5.9*  ALBUMIN 3.1* 2.6* 2.5*   No results for input(s): LIPASE, AMYLASE in the last 168 hours. No results for input(s): AMMONIA in the last 168 hours. Coagulation Profile: No results for input(s): INR, PROTIME in the last 168 hours. Cardiac Enzymes: No results for input(s): CKTOTAL, CKMB, CKMBINDEX, TROPONINI in the last 168 hours. BNP (last 3 results) No results for input(s): PROBNP in the last 8760 hours. HbA1C: Recent Labs    02/06/19 0501  HGBA1C 7.5*   CBG: Recent Labs  Lab 02/05/19 1224 02/05/19 1642 02/05/19 2033 02/06/19 0756 02/06/19 1152  GLUCAP 212* 224* 227* 159* 182*   Lipid Profile: No results for input(s): CHOL, HDL, LDLCALC, TRIG, CHOLHDL, LDLDIRECT in the last 72 hours. Thyroid Function Tests: No results for input(s): TSH, T4TOTAL, FREET4, T3FREE, THYROIDAB in the last 72 hours. Anemia Panel: No results for input(s): VITAMINB12, FOLATE, FERRITIN, TIBC, IRON, RETICCTPCT in the last 72 hours. Sepsis Labs: Recent Labs  Lab 02/04/19 1005 02/04/19 2249  PROCALCITON  --  <0.10  LATICACIDVEN 1.1 0.9    Recent Results  (from the past 240 hour(s))  Respiratory Panel by RT PCR (Flu A&B, Covid) - Nasopharyngeal Swab     Status: Abnormal   Collection Time: 02/04/19  2:54 PM   Specimen: Nasopharyngeal Swab  Result Value Ref Range Status   SARS Coronavirus 2 by RT PCR POSITIVE (A) NEGATIVE Final    Comment: RESULT CALLED TO, READ BACK BY AND VERIFIED WITH: 02/06/19 RN 16:45 02/04/19 ([email or web address]) (NOTE) SARS-CoV-2 target nucleic acids are DETECTED. SARS-CoV-2 RNA is generally detectable in upper respiratory specimens  during the acute phase of infection. Positive results are indicative of the presence of the identified virus, but do not rule out bacterial infection or co-infection with other pathogens not detected by the test. Clinical correlation with patient history and other diagnostic information is necessary to determine patient infection status. The expected result is Negative. Fact Sheet for Patients:  02/06/19 Fact Sheet for Healthcare Providers: https://www.moore.com/ This test is not yet approved or cleared by the https://www.young.biz/ FDA and  has been authorized for detection and/or diagnosis of SARS-CoV-2 by FDA  under an Emergency Use Authorization (EUA).  This EUA will remain in effect (meaning this test can be used) f or the duration of  the COVID-19 declaration under Section 564(b)(1) of the Act, 21 U.S.C. section 360bbb-3(b)(1), unless the authorization is terminated or revoked sooner.    Influenza A by PCR NEGATIVE NEGATIVE Final   Influenza B by PCR NEGATIVE NEGATIVE Final    Comment: (NOTE) The Xpert Xpress SARS-CoV-2/FLU/RSV assay is intended as an aid in  the diagnosis of influenza from Nasopharyngeal swab specimens and  should not be used as a sole basis for treatment. Nasal washings and  aspirates are unacceptable for Xpert Xpress SARS-CoV-2/FLU/RSV  testing. Fact Sheet for Patients: https://www.moore.com/ Fact Sheet  for Healthcare Providers: https://www.young.biz/ This test is not yet approved or cleared by the [place] FDA and  has been authorized for detection and/or diagnosis of SARS-CoV-2 by  FDA under an Emergency Use Authorization (EUA). This EUA will remain  in effect (meaning this test can be used) for the duration of the  Covid-19 declaration under Section 564(b)(1) of the Act, 21  U.S.C. section 360bbb-3(b)(1), unless the authorization is  terminated or revoked. Performed at [facility], [address] [place], [place] [address]          Radiology Studies: CT ANGIO CHEST PE W OR WO CONTRAST  Result Date: 02/05/2019 CLINICAL DATA:  Hypoxemia. Chest tightness. EXAM: CT ANGIOGRAPHY CHEST WITH CONTRAST TECHNIQUE: Multidetector CT imaging of the chest was performed using the standard protocol during bolus administration of intravenous contrast. Multiplanar CT image reconstructions and MIPs were obtained to evaluate the vascular anatomy. CONTRAST:  5mL OMNIPAQUE IOHEXOL 350 MG/ML SOLN COMPARISON:  None. FINDINGS: Cardiovascular: Contrast injection is sufficient to demonstrate satisfactory opacification of the pulmonary arteries to the segmental level. There is no pulmonary embolus. The main pulmonary artery is within normal limits for size. There is no CT evidence of acute right heart strain. The visualized aorta is normal. Heart size is normal, without pericardial effusion. Mediastinum/Nodes: --there are enlarged mediastinal hilar lymph nodes, likely reactive. --No axillary lymphadenopathy. --No supraclavicular lymphadenopathy. --Normal thyroid gland. --The esophagus is unremarkable Lungs/Pleura: There are diffuse bilateral ground-glass airspace opacities. There is no pneumothorax. No large pleural effusion. Upper Abdomen: There is hepatic steatosis. Musculoskeletal: No chest wall abnormality. No acute or significant osseous findings. Review of the MIP images confirms  the above findings. IMPRESSION: 1. No evidence of acute pulmonary embolus. 2. Diffuse bilateral ground-glass airspace opacities, concerning for an atypical process such as viral pneumonia. 3. Enlarged mediastinal and hilar lymph nodes, likely reactive. 4. Hepatic steatosis. Electronically Signed   By: Katherine Mantle M.D.   On: 02/05/2019 22:55   DG Chest Port 1 View  Result Date: 02/04/2019 CLINICAL DATA:  Low-grade fever EXAM: PORTABLE CHEST 1 VIEW COMPARISON:  January 03, 2019 FINDINGS: The mediastinal contour and cardiac silhouette are stable. Heart size is mildly enlarged. Patchy consolidation of bilateral mid lung are identified. There is no pleural effusion or pulmonary edema. The bony structures are stable. IMPRESSION: Patchy consolidation of bilateral mid lung suspicious for pneumonia. Electronically Signed   By: Sherian Rein M.D.   On: 02/04/2019 15:13        Scheduled Meds: . vitamin C  500 mg Oral Daily  . aspirin EC  81 mg Oral Daily  . enoxaparin (LOVENOX) injection  40 mg Subcutaneous Q12H  . insulin aspart  0-15 Units Subcutaneous TID WC  . insulin glargine  10 Units Subcutaneous  QHS  . Ipratropium-Albuterol  1 puff Inhalation Q6H  . methylPREDNISolone (SOLU-MEDROL) injection  40 mg Intravenous Q8H  . metoprolol succinate  25 mg Oral Daily  . venlafaxine XR  75 mg Oral Q breakfast  . zinc sulfate  220 mg Oral Daily   Continuous Infusions: . remdesivir 100 mg in NS 100 mL 100 mg (02/05/19 2004)     LOS: 2 days   Time spent= 35 mins    Zacharee Gaddie Arsenio Loader, MD [facility]  If 7PM-7AM, please contact night-coverage  02/06/2019, 12:17 PM

## 2019-02-07 LAB — MAGNESIUM: Magnesium: 2.3 mg/dL (ref 1.7–2.4)

## 2019-02-07 LAB — COMPREHENSIVE METABOLIC PANEL
ALT: 17 U/L (ref 0–44)
AST: 18 U/L (ref 15–41)
Albumin: 2.6 g/dL — ABNORMAL LOW (ref 3.5–5.0)
Alkaline Phosphatase: 63 U/L (ref 38–126)
Anion gap: 10 (ref 5–15)
BUN: 24 mg/dL — ABNORMAL HIGH (ref 6–20)
CO2: 23 mmol/L (ref 22–32)
Calcium: 8.8 mg/dL — ABNORMAL LOW (ref 8.9–10.3)
Chloride: 106 mmol/L (ref 98–111)
Creatinine, Ser: 0.94 mg/dL (ref 0.44–1.00)
GFR calc Af Amer: >60 mL/min (ref >60)
GFR calc non Af Amer: >60 mL/min (ref >60)
Glucose, Bld: 281 mg/dL — ABNORMAL HIGH (ref 70–99)
Potassium: 4.7 mmol/L (ref 3.5–5.1)
Sodium: 139 mmol/L (ref 135–145)
Total Bilirubin: 0.3 mg/dL (ref 0.3–1.2)
Total Protein: 6.2 g/dL — ABNORMAL LOW (ref 6.5–8.1)

## 2019-02-07 LAB — GLUCOSE, CAPILLARY
Glucose-Capillary: 268 mg/dL — ABNORMAL HIGH (ref 70–99)
Glucose-Capillary: 272 mg/dL — ABNORMAL HIGH (ref 70–99)
Glucose-Capillary: 302 mg/dL — ABNORMAL HIGH (ref 70–99)

## 2019-02-07 LAB — CBC WITH DIFFERENTIAL/PLATELET
Abs Immature Granulocytes: 0.12 10*3/uL — ABNORMAL HIGH (ref 0.00–0.07)
Basophils Absolute: 0 10*3/uL (ref 0.0–0.1)
Basophils Relative: 1 %
Eosinophils Absolute: 0 10*3/uL (ref 0.0–0.5)
Eosinophils Relative: 0 %
HCT: 39.5 % (ref 36.0–46.0)
Hemoglobin: 12.9 g/dL (ref 12.0–15.0)
Immature Granulocytes: 3 %
Lymphocytes Relative: 22 %
Lymphs Abs: 1.1 10*3/uL (ref 0.7–4.0)
MCH: 27.3 pg (ref 26.0–34.0)
MCHC: 32.7 g/dL (ref 30.0–36.0)
MCV: 83.5 fL (ref 80.0–100.0)
Monocytes Absolute: 0.4 10*3/uL (ref 0.1–1.0)
Monocytes Relative: 8 %
Neutro Abs: 3.2 10*3/uL (ref 1.7–7.7)
Neutrophils Relative %: 66 %
Platelets: 242 10*3/uL (ref 150–400)
RBC: 4.73 MIL/uL (ref 3.87–5.11)
RDW: 14.2 % (ref 11.5–15.5)
WBC: 4.9 10*3/uL (ref 4.0–10.5)
nRBC: 0 % (ref 0.0–0.2)

## 2019-02-07 LAB — BRAIN NATRIURETIC PEPTIDE: B Natriuretic Peptide: 44.6 pg/mL (ref 0.0–100.0)

## 2019-02-07 LAB — LACTATE DEHYDROGENASE: LDH: 255 U/L — ABNORMAL HIGH (ref 98–192)

## 2019-02-07 LAB — D-DIMER, QUANTITATIVE: D-Dimer, Quant: 5.38 ug/mL-FEU — ABNORMAL HIGH (ref 0.00–0.50)

## 2019-02-07 LAB — C-REACTIVE PROTEIN: CRP: 2.1 mg/dL — ABNORMAL HIGH (ref <1.0)

## 2019-02-07 MED ORDER — INSULIN ASPART 100 UNIT/ML ~~LOC~~ SOLN
5.0000 [IU] | Freq: Three times a day (TID) | SUBCUTANEOUS | Status: DC
  Administered 2019-02-07 – 2019-02-08 (×3): 5 [IU] via SUBCUTANEOUS

## 2019-02-07 NOTE — Progress Notes (Signed)
Patient educated significantly throughout the shift on incentive spirometry and flutter valve. Patient sat up in the chair for 10 hours and ambulated throughout room a few times during shift. Patient educated on proning/side laying tonight while she sleeps.

## 2019-02-07 NOTE — Progress Notes (Addendum)
Inpatient Diabetes Program Recommendations  AACE/ADA: New Consensus Statement on Inpatient Glycemic Control (2015)  Target Ranges:  Prepandial:   less than 140 mg/dL      Peak postprandial:   less than 180 mg/dL (1-2 hours)      Critically ill patients:  140 - 180 mg/dL   Lab Results  Component Value Date   GLUCAP 272 (H) 02/07/2019   HGBA1C 7.5 (H) 02/06/2019    Review of Glycemic Control Results for [name], [name] (MRN [id number]) as of 02/07/2019 15:05  Ref. Range 02/06/2019 07:56 02/06/2019 11:52 02/06/2019 17:00 02/07/2019 08:04 02/07/2019 11:41  Glucose-Capillary Latest Ref Range: 70 - 99 mg/dL 037 (H) 048 (H) 889 (H) 268 (H) 272 (H)   Diabetes history: DM2 Outpatient Diabetes medications: Metformin 500 mg BID  Current orders for Inpatient glycemic control: Lantus 10 units q hs + Novolog moderate correction tid   Inpatient Diabetes Program Recommendations:   -Add Novolog correction 0-5 units qd -Add Novolog 3 units tid meal coverage if eats 50% -Tradjenta 5 mg qd  Thank you, Billy Fischer. Annelle Behrendt, RN, MSN, CDE  Diabetes Coordinator Inpatient Glycemic Control Team Team Pager [phone] (8am-5pm) 02/07/2019 3:07 PM

## 2019-02-07 NOTE — Progress Notes (Signed)
PROGRESS NOTE    [name] [facility]  WCB:[id number] DOB: [birth year] DOA: 02/04/2019 PCP: Elizabeth Palau, FNP   Brief Narrative:  59 y.o.femalewith PMH significant for factor V Leiden heterozygous state with history of PE/DVT in the past, DM 2, paroxysmal SVT and chronic migraines who was in her usual state of good health until 6 days ago when she woke up with body aches "like I got hit with a bat" and malaise. She subsequently developed dyspnea on exertion and 3 days ago developed diarrhea 4-5 times a day, watery no blood. She was Covid positive with chest x-ray showing bilateral patchy infiltrate.   Assessment & Plan:   Principal Problem:   Pneumonia due to COVID-19 virus Active Problems:   Factor 5 Leiden mutation, heterozygous (HCC)   Diabetes (HCC)   Chronic migraine   Paroxysmal SVT (supraventricular tachycardia) (HCC)   History of adverse effect of venous thromboembolism (VTE) prophylaxis  Acute hypoxia with COVID-19 pneumonia -Oxygen levels-still needing 2-3 L nasal cannula -Remdesivir-day 4 -Solu-Medrol-day 4 -Routine: Labs have been reviewed including ferritin, LDH, CRP, d-dimer, fibrinogen.  Will need to trend this lab daily. -Vitamin C & Zinc. Prone >16hrs/day.  -procalcitonin-negative -Chest x-ray-bilateral infiltrates -Supportive care-antitussive, inhalers, I-S/flutter -CODE STATUS confirmed  History of heterozygous for factor V Leiden -On daily aspirin 325 mg. -Advised to be on daily PPI AC  Diabetes mellitus type 2, uncontrolled due to hyperglycemia from steroids -Lantus 10 units at bedtime.  Insulin sliding scale and Accu-Chek -A1c 7.5  Chronic migraines -Continue home meds  Paroxysmal SVT -Metoprolol    DVT prophylaxis: Lovenox every 12 hours. Code Status: Full code Family Communication: None Disposition Plan: Maintain hospital stay until hypoxia has improved.  Hopefully be able to wean her off by tomorrow.  Consultants:   None  Procedures:    None  Antimicrobials:   None   Subjective: Feels somewhat better than yesterday.  Still has some coughing and sore throat but improved.  Does not feel back to baseline yet therefore she does not feel safe to go home.  Review of Systems Otherwise negative except as per HPI, including: General = no fevers, chills, dizziness, malaise, fatigue HEENT/EYES = negative for pain, redness, loss of vision, double vision, blurred vision, loss of hearing,  hoarseness, dysphagia Cardiovascular= negative for chest pain, palpitation, murmurs, lower extremity swelling Respiratory/lungs= negative for hemoptysis, wheezing, mucus production Gastrointestinal= negative for nausea, vomiting,, abdominal pain, melena, hematemesis Genitourinary= negative for Dysuria, Hematuria, Change in Urinary Frequency MSK = Negative for arthralgia, myalgias, Back Pain, Joint swelling  Neurology= Negative for headache, seizures, numbness, tingling  Psychiatry= Negative for anxiety, depression, suicidal and homocidal ideation Allergy/Immunology= Medication/Food allergy as listed  Skin= Negative for Rash, lesions, ulcers, itching   Objective: Vitals:   02/07/19 0621 02/07/19 0747 02/07/19 0837 02/07/19 1120  BP: 125/67     Pulse: (!) 49 (!) 46 (!) 49 78  Resp: 18 20 (!) 22 19  Temp: 97.9 F (36.6 C)     TempSrc: Oral     SpO2: 95% 91% 93% 90%  Weight:      Height:        Intake/Output Summary (Last 24 hours) at 02/07/2019 1150 Last data filed at 02/07/2019 1049 Gross per 24 hour  Intake 720 ml  Output --  Net 720 ml   Filed Weights   02/04/19 0936 02/04/19 2230  Weight: 108.9 kg 108.6 kg    Examination:  Constitutional: Not in acute distress Respiratory: Clear to auscultation bilaterally Cardiovascular: Normal  sinus rhythm, no rubs Abdomen: Nontender nondistended good bowel sounds Musculoskeletal: No edema noted Skin: No rashes seen Neurologic: CN 2-12 grossly intact.  And nonfocal Psychiatric:  Normal judgment and insight. Alert and oriented x 3. Normal mood.    Data Reviewed:   CBC: Recent Labs  Lab 02/04/19 0938 02/04/19 2249 02/05/19 0634 02/06/19 0500 02/07/19 0735  WBC 5.3 4.2 5.1 5.9 4.9  NEUTROABS 4.0  --  3.9 4.1 3.2  HGB 12.9 11.6* 12.1 11.9* 12.9  HCT 40.4 35.8* 37.7 37.7 39.5  MCV 84.7 84.6 85.1 85.9 83.5  PLT 203 205 187 262 242   Basic Metabolic Panel: Recent Labs  Lab 02/04/19 0938 02/04/19 2249 02/05/19 0634 02/06/19 0500 02/07/19 0735  NA 137  --  140 140 139  K 4.0  --  4.4 4.4 4.7  CL 105  --  109 105 106  CO2 18*  --  20* 23 23  GLUCOSE 198*  --  213* 196* 281*  BUN 9  --  13 20 24*  CREATININE 1.02* 1.02* 0.98 0.87 0.94  CALCIUM 8.6*  --  8.3* 8.5* 8.8*  MG  --   --   --   --  2.3   GFR: Estimated Creatinine Clearance: 78.9 mL/min (by C-G formula based on SCr of 0.94 mg/dL). Liver Function Tests: Recent Labs  Lab 02/04/19 0938 02/05/19 0634 02/06/19 0500 02/07/19 0735  AST 34 25 21 18   ALT 26 21 17 17   ALKPHOS 79 70 65 63  BILITOT 0.6 0.4 0.4 0.3  PROT 7.1 6.3* 5.9* 6.2*  ALBUMIN 3.1* 2.6* 2.5* 2.6*   No results for input(s): LIPASE, AMYLASE in the last 168 hours. No results for input(s): AMMONIA in the last 168 hours. Coagulation Profile: No results for input(s): INR, PROTIME in the last 168 hours. Cardiac Enzymes: No results for input(s): CKTOTAL, CKMB, CKMBINDEX, TROPONINI in the last 168 hours. BNP (last 3 results) No results for input(s): PROBNP in the last 8760 hours. HbA1C: Recent Labs    02/06/19 0501  HGBA1C 7.5*   CBG: Recent Labs  Lab 02/06/19 0756 02/06/19 1152 02/06/19 1700 02/07/19 0804 02/07/19 1141  GLUCAP 159* 182* 261* 268* 272*   Lipid Profile: No results for input(s): CHOL, HDL, LDLCALC, TRIG, CHOLHDL, LDLDIRECT in the last 72 hours. Thyroid Function Tests: No results for input(s): TSH, T4TOTAL, FREET4, T3FREE, THYROIDAB in the last 72 hours. Anemia Panel: No results for input(s):  VITAMINB12, FOLATE, FERRITIN, TIBC, IRON, RETICCTPCT in the last 72 hours. Sepsis Labs: Recent Labs  Lab 02/04/19 1005 02/04/19 2249  PROCALCITON  --  <0.10  LATICACIDVEN 1.1 0.9    Recent Results (from the past 240 hour(s))  Respiratory Panel by RT PCR (Flu A&B, Covid) - Nasopharyngeal Swab     Status: Abnormal   Collection Time: 02/04/19  2:54 PM   Specimen: Nasopharyngeal Swab  Result Value Ref Range Status   SARS Coronavirus 2 by RT PCR POSITIVE (A) NEGATIVE Final    Comment: RESULT CALLED TO, READ BACK BY AND VERIFIED WITH: 02/06/19 RN 16:45 02/04/19 ([email or web address]) (NOTE) SARS-CoV-2 target nucleic acids are DETECTED. SARS-CoV-2 RNA is generally detectable in upper respiratory specimens  during the acute phase of infection. Positive results are indicative of the presence of the identified virus, but do not rule out bacterial infection or co-infection with other pathogens not detected by the test. Clinical correlation with patient history and other diagnostic information is necessary to determine patient infection status. The expected result is  Negative. Fact Sheet for Patients:  PinkCheek.be Fact Sheet for Healthcare Providers: GravelBags.it This test is not yet approved or cleared by the [place] FDA and  has been authorized for detection and/or diagnosis of SARS-CoV-2 by FDA under an Emergency Use Authorization (EUA).  This EUA will remain in effect (meaning this test can be used) f or the duration of  the COVID-19 declaration under Section 564(b)(1) of the Act, 21 U.S.C. section 360bbb-3(b)(1), unless the authorization is terminated or revoked sooner.    Influenza A by PCR NEGATIVE NEGATIVE Final   Influenza B by PCR NEGATIVE NEGATIVE Final    Comment: (NOTE) The Xpert Xpress SARS-CoV-2/FLU/RSV assay is intended as an aid in  the diagnosis of influenza from Nasopharyngeal swab specimens and  should not be used  as a sole basis for treatment. Nasal washings and  aspirates are unacceptable for Xpert Xpress SARS-CoV-2/FLU/RSV  testing. Fact Sheet for Patients: PinkCheek.be Fact Sheet for Healthcare Providers: GravelBags.it This test is not yet approved or cleared by the [place] FDA and  has been authorized for detection and/or diagnosis of SARS-CoV-2 by  FDA under an Emergency Use Authorization (EUA). This EUA will remain  in effect (meaning this test can be used) for the duration of the  Covid-19 declaration under Section 564(b)(1) of the Act, 21  U.S.C. section 360bbb-3(b)(1), unless the authorization is  terminated or revoked. Performed at [facility], [place] [address], [place], [place] [address]          Radiology Studies: CT ANGIO CHEST PE W OR WO CONTRAST  Result Date: 02/05/2019 CLINICAL DATA:  Hypoxemia. Chest tightness. EXAM: CT ANGIOGRAPHY CHEST WITH CONTRAST TECHNIQUE: Multidetector CT imaging of the chest was performed using the standard protocol during bolus administration of intravenous contrast. Multiplanar CT image reconstructions and MIPs were obtained to evaluate the vascular anatomy. CONTRAST:  19mL OMNIPAQUE IOHEXOL 350 MG/ML SOLN COMPARISON:  None. FINDINGS: Cardiovascular: Contrast injection is sufficient to demonstrate satisfactory opacification of the pulmonary arteries to the segmental level. There is no pulmonary embolus. The main pulmonary artery is within normal limits for size. There is no CT evidence of acute right heart strain. The visualized aorta is normal. Heart size is normal, without pericardial effusion. Mediastinum/Nodes: --there are enlarged mediastinal hilar lymph nodes, likely reactive. --No axillary lymphadenopathy. --No supraclavicular lymphadenopathy. --Normal thyroid gland. --The esophagus is unremarkable Lungs/Pleura: There are diffuse bilateral ground-glass airspace opacities. There is  no pneumothorax. No large pleural effusion. Upper Abdomen: There is hepatic steatosis. Musculoskeletal: No chest wall abnormality. No acute or significant osseous findings. Review of the MIP images confirms the above findings. IMPRESSION: 1. No evidence of acute pulmonary embolus. 2. Diffuse bilateral ground-glass airspace opacities, concerning for an atypical process such as viral pneumonia. 3. Enlarged mediastinal and hilar lymph nodes, likely reactive. 4. Hepatic steatosis. Electronically Signed   By: Constance Holster M.D.   On: 02/05/2019 22:55        Scheduled Meds: . vitamin C  500 mg Oral Daily  . aspirin  325 mg Oral Daily  . enoxaparin (LOVENOX) injection  40 mg Subcutaneous Q12H  . insulin aspart  0-15 Units Subcutaneous TID WC  . insulin glargine  10 Units Subcutaneous QHS  . Ipratropium-Albuterol  1 puff Inhalation Q6H  . methylPREDNISolone (SOLU-MEDROL) injection  40 mg Intravenous Q8H  . metoprolol succinate  25 mg Oral Daily  . pantoprazole  40 mg Oral QAC breakfast  . venlafaxine XR  75 mg Oral Q breakfast  .  zinc sulfate  220 mg Oral Daily   Continuous Infusions: . remdesivir 100 mg in NS 100 mL 100 mg (02/07/19 0855)     LOS: 3 days   Time spent=    Abel Hageman Joline Maxcy, MD [facility]  If 7PM-7AM, please contact night-coverage  02/07/2019, 11:50 AM

## 2019-02-07 NOTE — Progress Notes (Signed)
1000 metoprolol held due to heart rate in 40's-50's.

## 2019-02-08 LAB — CBC WITH DIFFERENTIAL/PLATELET
Abs Immature Granulocytes: 0 10*3/uL (ref 0.00–0.07)
Basophils Absolute: 0 10*3/uL (ref 0.0–0.1)
Basophils Relative: 0 %
Eosinophils Absolute: 0 10*3/uL (ref 0.0–0.5)
Eosinophils Relative: 0 %
HCT: 38 % (ref 36.0–46.0)
Hemoglobin: 12.2 g/dL (ref 12.0–15.0)
Lymphocytes Relative: 9 %
Lymphs Abs: 0.6 10*3/uL — ABNORMAL LOW (ref 0.7–4.0)
MCH: 27.4 pg (ref 26.0–34.0)
MCHC: 32.1 g/dL (ref 30.0–36.0)
MCV: 85.4 fL (ref 80.0–100.0)
Monocytes Absolute: 0.4 10*3/uL (ref 0.1–1.0)
Monocytes Relative: 6 %
Neutro Abs: 5.9 10*3/uL (ref 1.7–7.7)
Neutrophils Relative %: 85 %
Platelets: 319 10*3/uL (ref 150–400)
RBC: 4.45 MIL/uL (ref 3.87–5.11)
RDW: 14.3 % (ref 11.5–15.5)
WBC: 6.9 10*3/uL (ref 4.0–10.5)
nRBC: 0 % (ref 0.0–0.2)
nRBC: 0 /100 WBC

## 2019-02-08 LAB — COMPREHENSIVE METABOLIC PANEL
ALT: 18 U/L (ref 0–44)
AST: 17 U/L (ref 15–41)
Albumin: 2.6 g/dL — ABNORMAL LOW (ref 3.5–5.0)
Alkaline Phosphatase: 59 U/L (ref 38–126)
Anion gap: 10 (ref 5–15)
BUN: 28 mg/dL — ABNORMAL HIGH (ref 6–20)
CO2: 20 mmol/L — ABNORMAL LOW (ref 22–32)
Calcium: 8.6 mg/dL — ABNORMAL LOW (ref 8.9–10.3)
Chloride: 106 mmol/L (ref 98–111)
Creatinine, Ser: 0.83 mg/dL (ref 0.44–1.00)
GFR calc Af Amer: >60 mL/min (ref >60)
GFR calc non Af Amer: >60 mL/min (ref >60)
Glucose, Bld: 273 mg/dL — ABNORMAL HIGH (ref 70–99)
Potassium: 5 mmol/L (ref 3.5–5.1)
Sodium: 136 mmol/L (ref 135–145)
Total Bilirubin: 0.5 mg/dL (ref 0.3–1.2)
Total Protein: 5.8 g/dL — ABNORMAL LOW (ref 6.5–8.1)

## 2019-02-08 LAB — GLUCOSE, CAPILLARY
Glucose-Capillary: 242 mg/dL — ABNORMAL HIGH (ref 70–99)
Glucose-Capillary: 242 mg/dL — ABNORMAL HIGH (ref 70–99)
Glucose-Capillary: 347 mg/dL — ABNORMAL HIGH (ref 70–99)

## 2019-02-08 LAB — LACTATE DEHYDROGENASE: LDH: 243 U/L — ABNORMAL HIGH (ref 98–192)

## 2019-02-08 LAB — D-DIMER, QUANTITATIVE: D-Dimer, Quant: 1.08 ug/mL-FEU — ABNORMAL HIGH (ref 0.00–0.50)

## 2019-02-08 LAB — MAGNESIUM: Magnesium: 2.3 mg/dL (ref 1.7–2.4)

## 2019-02-08 LAB — C-REACTIVE PROTEIN: CRP: 1 mg/dL — ABNORMAL HIGH (ref <1.0)

## 2019-02-08 MED ORDER — IPRATROPIUM BROMIDE HFA 17 MCG/ACT IN AERS: 2.0000 | INHALATION_SPRAY | Freq: Four times a day (QID) | RESPIRATORY_TRACT | 0 refills | Status: DC

## 2019-02-08 MED ORDER — DEXAMETHASONE 6 MG PO TABS: 6.0000 mg | ORAL_TABLET | Freq: Every day | ORAL | 0 refills | Status: AC

## 2019-02-08 MED ORDER — ALBUTEROL SULFATE HFA 108 (90 BASE) MCG/ACT IN AERS: 2.0000 | INHALATION_SPRAY | Freq: Four times a day (QID) | RESPIRATORY_TRACT | 0 refills | Status: DC | PRN

## 2019-02-08 MED ORDER — PANTOPRAZOLE SODIUM 40 MG PO TBEC: 40.0000 mg | DELAYED_RELEASE_TABLET | Freq: Every day | ORAL | 0 refills | Status: DC

## 2019-02-08 MED FILL — VENTOLIN HFA 90 MCG INHALER: 108 (90 BAS | 25 days supply | Qty: 18 | Fill #0

## 2019-02-08 MED FILL — PANTOPRAZOLE SOD DR 40 MG T: 40 | 30 days supply | Qty: 30 | Fill #0

## 2019-02-08 MED FILL — DEXAMETHASONE 2 MG TABLET: 2 | 15 days supply | Qty: 15 | Fill #0

## 2019-02-08 NOTE — Discharge Summary (Signed)
Physician Discharge Summary  [place] IPJ:[id number] DOB: 1960/11/16 DOA: 02/04/2019  PCP: Vicenta Aly, FNP  Admit date: 02/04/2019 Discharge date: 02/08/2019  Admitted From: Home Disposition: Home  Recommendations for Outpatient Follow-up:  1. Follow up with PCP in 1-2 weeks 2. Please obtain BMP/CBC in one week your next doctors visit.  3. Decadron and bronchodilators prescribed 4. Added daily PPI to be taken before breakfast.  Home Health: None Equipment/Devices: 2 L home oxygen Discharge Condition: Stable CODE STATUS: Full code Diet recommendation: Diabetic  Brief/Interim Summary: 59 y.o.femalewith PMH significant for factor V Leiden heterozygous state with history of PE/DVT in the past, DM 2, paroxysmal SVT and chronic migraines who was in her usual state of good health until 6 days ago when she woke up with body aches "like I got hit with a bat" and malaise. She subsequently developed dyspnea on exertion and 3 days ago developed diarrhea 4-5 times a day, watery no blood. She was Covid positive with chest x-ray showing bilateral patchy infiltrate.  Initially she was on 4 L nasal cannula and slowly transition to 2 L nasal cannula especially with ambulation.  Completed 5-day treatment with IV remdesivir, Solu-Medrol was transitioned to Decadron.  Inflammatory markers improved.  Symptomatically she started feeling much better.  Because she is on full dose daily aspirin at home for factor V Leiden, have added daily PPI before breakfast.  She is ambulating only with mild hypoxia down to 85% on room air but with quick recovery 95% on 2 L nasal cannula.  She has reached maximum benefit from hospital stay and stable for discharge with outpatient follow-up recommendations as stated above.   Acute hypoxia with COVID-19 pneumonia -Much improved.  Still requiring 2 L nasal cannula therefore will make home arrangements -Completed remdesivir 5-day course on 1/22 -Transition to oral  Decadron for 5 more days -Inflammatory markers better.  History of heterozygous for factor V Leiden -On daily aspirin 325 mg. -Advised to be on daily PPI AC, prescription given  Diabetes mellitus type 2, uncontrolled due to hyperglycemia from steroids -Resume home Metformin. -A1c 7.5  Chronic migraines -Continue home meds  Paroxysmal SVT -Metoprolol   Discharge Diagnoses:  Principal Problem:   Pneumonia due to COVID-19 virus Active Problems:   Factor 5 Leiden mutation, heterozygous ([place])   Diabetes ([place])   Chronic migraine   Paroxysmal SVT (supraventricular tachycardia) (H[facility])   History of adverse effect of venous thromboembolism (VTE) prophylaxis    Consultations:  None  Subjective: Feels much better this morning overall compared to when she came to the hospital.  Still having mild exertional dyspnea and cough.  Discharge Exam: Vitals:   02/08/19 0029 02/08/19 0629  BP: 115/71 135/74  Pulse: (!) 49   Resp: 19   Temp: 97.9 F (36.6 C) 98.1 F (36.7 C)  SpO2: 94%    Vitals:   02/07/19 1120 02/07/19 1240 02/08/19 0029 02/08/19 0629  BP:  113/70 115/71 135/74  Pulse: 78 64 (!) 49   Resp: 19 17 19    Temp:  98.4 F (36.9 C) 97.9 F (36.6 C) 98.1 F (36.7 C)  TempSrc:  Oral Oral Oral  SpO2: 90% 90% 94%   Weight:      Height:        General: Pt is alert, awake, not in acute distress Cardiovascular: RRR, S1/S2 +, no rubs, no gallops Respiratory: Very minimal bilateral bibasilar rhonchi Abdominal: Soft, NT, ND, bowel sounds + Extremities: no edema, no cyanosis  Discharge Instructions  Discharge Instructions    Call MD for:  difficulty breathing, headache or visual disturbances   Complete by: As directed    Diet - low sodium heart healthy   Complete by: As directed    Discharge instructions   Complete by: As directed    You were cared for by a hospitalist during your hospital stay. If you have any questions about your discharge medications or  the care you received while you were in the hospital after you are discharged, you can call the unit and asked to speak with the hospitalist on call if the hospitalist that took care of you is not available. Once you are discharged, your primary care physician will handle any further medical issues. Please note that NO REFILLS for any discharge medications will be authorized once you are discharged, as it is imperative that you return to your primary care physician (or establish a relationship with a primary care physician if you do not have one) for your aftercare needs so that they can reassess your need for medications and monitor your lab values.  Please request your Prim.MD to go over all Hospital Tests and Procedure/Radiological results at the follow up, please get all Hospital records sent to your Prim MD by signing hospital release before you go home.  Get CBC, CMP, 2 view Chest X ray checked  by Primary MD during your next visit or SNF MD in 5-7 days ( we routinely change or add medications that can affect your baseline labs and fluid status, therefore we recommend that you get the mentioned basic workup next visit with your PCP, your PCP may decide not to get them or add new tests based on their clinical decision)  On your next visit with your primary care physician please Get Medicines reviewed and adjusted.  If you experience worsening of your admission symptoms, develop shortness of breath, life threatening emergency, suicidal or homicidal thoughts you must seek medical attention immediately by calling 911 or calling your MD immediately  if symptoms less severe.  You Must read complete instructions/literature along with all the possible adverse reactions/side effects for all the Medicines you take and that have been prescribed to you. Take any new Medicines after you have completely understood and accpet all the possible adverse reactions/side effects.   Do not drive, operate heavy  machinery, perform activities at heights, swimming or participation in water activities or provide baby sitting services if your were admitted for syncope or siezures until you have seen by Primary MD or a Neurologist and advised to do so again.  Do not drive when taking Pain medications.   Increase activity slowly   Complete by: As directed      Allergies as of 02/08/2019      Reactions   Statins Other (See Comments)   Pt states she has severe joint and body aches.   Codeine Hives   rash      Medication List    TAKE these medications   albuterol 108 (90 Base) MCG/ACT inhaler Commonly known as: VENTOLIN HFA Inhale 2 puffs into the lungs every 6 (six) hours as needed for wheezing or shortness of breath.   aspirin EC 325 MG tablet Take 325 mg by mouth daily. What changed: Another medication with the same name was removed. Continue taking this medication, and follow the directions you see here.   Biotin 97353 MCG Tabs Take 10,000 mcg by mouth daily.   cimetidine 200 MG tablet Commonly known as: TAGAMET Take  200 mg by mouth daily as needed.   dexamethasone 6 MG tablet Commonly known as: DECADRON Take 1 tablet (6 mg total) by mouth daily for 5 days.   diclofenac 75 MG EC tablet Commonly known as: VOLTAREN Take 1 tablet (75 mg total) by mouth 2 (two) times daily.   Garlic 1000 MG Caps Take 1,000 mg by mouth daily.   ibuprofen 200 MG tablet Commonly known as: ADVIL Take 400 mg by mouth every 6 (six) hours as needed for mild pain.   ipratropium 17 MCG/ACT inhaler Commonly known as: ATROVENT HFA Inhale 2 puffs into the lungs 4 (four) times daily.   KRILL OIL PO Take 750 mg by mouth daily.   MAGNESIUM PO Take 250 mg by mouth daily.   metFORMIN 500 MG tablet Commonly known as: GLUCOPHAGE Take 500 mg by mouth 2 (two) times daily with a meal.   metoprolol succinate 25 MG 24 hr tablet Commonly known as: Toprol XL Take 1 tablet (25 mg total) by mouth daily.    pantoprazole 40 MG tablet Commonly known as: PROTONIX Take 1 tablet (40 mg total) by mouth daily before breakfast.   SUMAtriptan 6 MG/0.5ML Soaj Commonly known as: Imitrex STATdose System Inject 6 mg into the skin as needed.   SUMAtriptan 100 MG tablet Commonly known as: IMITREX Take 1 tab at onset of migraine.  May repeat in 2 hrs, if needed.  Max dose: 2 tabs/day. This is a 90 day prescription.   venlafaxine XR 75 MG 24 hr capsule Commonly known as: EFFEXOR-XR Take 1 capsule (75 mg total) by mouth daily with breakfast. What changed: when to take this   VITAMIN D3 PO Take 5,000 Units by mouth daily.            Durable Medical Equipment  (From admission, onward)         Start     Ordered   02/08/19 1140  DME Oxygen  Once    Comments: Diagnosis acute hypoxic respiratory failure due to COVID-19 pneumonia  Question Answer Comment  Length of Need 6 Months   Mode or (Route) Mask   Liters per Minute 2   Frequency Continuous (stationary and portable oxygen unit needed)   Oxygen delivery system Gas      02/08/19 1140         Follow-up Information    Elizabeth Palau, FNP. Schedule an appointment as soon as possible for a visit in 1 week(s).   Specialty: Nurse Practitioner Why: Regarding today's encounter Contact information: [address] [place] [address] [phone]          Allergies  Allergen Reactions  . Statins Other (See Comments)    Pt states she has severe joint and body aches.  . Codeine Hives    rash    You were cared for by a hospitalist during your hospital stay. If you have any questions about your discharge medications or the care you received while you were in the hospital after you are discharged, you can call the unit and asked to speak with the hospitalist on call if the hospitalist that took care of you is not available. Once you are discharged, your primary care physician will handle any further medical issues.  Please note that no refills for any discharge medications will be authorized once you are discharged, as it is imperative that you return to your primary care physician (or establish a relationship with a primary care physician if you do  not have one) for your aftercare needs so that they can reassess your need for medications and monitor your lab values.   Procedures/Studies: CT ANGIO CHEST PE W OR WO CONTRAST  Result Date: 02/05/2019 CLINICAL DATA:  Hypoxemia. Chest tightness. EXAM: CT ANGIOGRAPHY CHEST WITH CONTRAST TECHNIQUE: Multidetector CT imaging of the chest was performed using the standard protocol during bolus administration of intravenous contrast. Multiplanar CT image reconstructions and MIPs were obtained to evaluate the vascular anatomy. CONTRAST:  61mL OMNIPAQUE IOHEXOL 350 MG/ML SOLN COMPARISON:  None. FINDINGS: Cardiovascular: Contrast injection is sufficient to demonstrate satisfactory opacification of the pulmonary arteries to the segmental level. There is no pulmonary embolus. The main pulmonary artery is within normal limits for size. There is no CT evidence of acute right heart strain. The visualized aorta is normal. Heart size is normal, without pericardial effusion. Mediastinum/Nodes: --there are enlarged mediastinal hilar lymph nodes, likely reactive. --No axillary lymphadenopathy. --No supraclavicular lymphadenopathy. --Normal thyroid gland. --The esophagus is unremarkable Lungs/Pleura: There are diffuse bilateral ground-glass airspace opacities. There is no pneumothorax. No large pleural effusion. Upper Abdomen: There is hepatic steatosis. Musculoskeletal: No chest wall abnormality. No acute or significant osseous findings. Review of the MIP images confirms the above findings. IMPRESSION: 1. No evidence of acute pulmonary embolus. 2. Diffuse bilateral ground-glass airspace opacities, concerning for an atypical process such as viral pneumonia. 3. Enlarged mediastinal and hilar lymph  nodes, likely reactive. 4. Hepatic steatosis. Electronically Signed   By: Katherine Mantle M.D.   On: 02/05/2019 22:55   DG Chest Port 1 View  Result Date: 02/04/2019 CLINICAL DATA:  Low-grade fever EXAM: PORTABLE CHEST 1 VIEW COMPARISON:  January 03, 2019 FINDINGS: The mediastinal contour and cardiac silhouette are stable. Heart size is mildly enlarged. Patchy consolidation of bilateral mid lung are identified. There is no pleural effusion or pulmonary edema. The bony structures are stable. IMPRESSION: Patchy consolidation of bilateral mid lung suspicious for pneumonia. Electronically Signed   By: Sherian Rein M.D.   On: 02/04/2019 15:13      The results of significant diagnostics from this hospitalization (including imaging, microbiology, ancillary and laboratory) are listed below for reference.     Microbiology: Recent Results (from the past 240 hour(s))  Respiratory Panel by RT PCR (Flu A&B, Covid) - Nasopharyngeal Swab     Status: Abnormal   Collection Time: 02/04/19  2:54 PM   Specimen: Nasopharyngeal Swab  Result Value Ref Range Status   SARS Coronavirus 2 by RT PCR POSITIVE (A) NEGATIVE Final    Comment: RESULT CALLED TO, READ BACK BY AND VERIFIED WITH: Celedonio Savage RN 16:45 02/04/19 ([email or web address]) (NOTE) SARS-CoV-2 target nucleic acids are DETECTED. SARS-CoV-2 RNA is generally detectable in upper respiratory specimens  during the acute phase of infection. Positive results are indicative of the presence of the identified virus, but do not rule out bacterial infection or co-infection with other pathogens not detected by the test. Clinical correlation with patient history and other diagnostic information is necessary to determine patient infection status. The expected result is Negative. Fact Sheet for Patients:  https://www.moore.com/ Fact Sheet for Healthcare Providers: https://www.young.biz/ This test is not yet approved or cleared by the  [place] FDA and  has been authorized for detection and/or diagnosis of SARS-CoV-2 by FDA under an Emergency Use Authorization (EUA).  This EUA will remain in effect (meaning this test can be used) f or the duration of  the COVID-19 declaration under Section 564(b)(1) of the Act, 21 U.S.C. section 360bbb-3(b)(1), unless  the authorization is terminated or revoked sooner.    Influenza A by PCR NEGATIVE NEGATIVE Final   Influenza B by PCR NEGATIVE NEGATIVE Final    Comment: (NOTE) The Xpert Xpress SARS-CoV-2/FLU/RSV assay is intended as an aid in  the diagnosis of influenza from Nasopharyngeal swab specimens and  should not be used as a sole basis for treatment. Nasal washings and  aspirates are unacceptable for Xpert Xpress SARS-CoV-2/FLU/RSV  testing. Fact Sheet for Patients: https://www.moore.com/https://www.fda.gov/media/142436/download Fact Sheet for Healthcare Providers: https://www.young.biz/https://www.fda.gov/media/142435/download This test is not yet approved or cleared by the [place] FDA and  has been authorized for detection and/or diagnosis of SARS-CoV-2 by  FDA under an Emergency Use Authorization (EUA). This EUA will remain  in effect (meaning this test can be used) for the duration of the  Covid-19 declaration under Section 564(b)(1) of the Act, 21  U.S.C. section 360bbb-3(b)(1), unless the authorization is  terminated or revoked. Performed at [facility], [address] [place], [place] [address]      Labs: BNP (last 3 results) Recent Labs    02/07/19 0735  BNP 44.6   Basic Metabolic Panel: Recent Labs  Lab 02/04/19 0938 02/04/19 0938 02/04/19 2249 02/05/19 0634 02/06/19 0500 02/07/19 0735 02/08/19 0315  NA 137  --   --  140 140 139 136  K 4.0  --   --  4.4 4.4 4.7 5.0  CL 105  --   --  109 105 106 106  CO2 18*  --   --  20* 23 23 20*  GLUCOSE 198*  --   --  213* 196* 281* 273*  BUN 9  --   --  13 20 24* 28*  CREATININE 1.02*   < > 1.02* 0.98 0.87 0.94 0.83  CALCIUM 8.6*   --   --  8.3* 8.5* 8.8* 8.6*  MG  --   --   --   --   --  2.3 2.3   < > = values in this interval not displayed.   Liver Function Tests: Recent Labs  Lab 02/04/19 0938 02/05/19 0634 02/06/19 0500 02/07/19 0735 02/08/19 0315  AST 34 25 21 18 17   ALT 26 21 17 17 18   ALKPHOS 79 70 65 63 59  BILITOT 0.6 0.4 0.4 0.3 0.5  PROT 7.1 6.3* 5.9* 6.2* 5.8*  ALBUMIN 3.1* 2.6* 2.5* 2.6* 2.6*   No results for input(s): LIPASE, AMYLASE in the last 168 hours. No results for input(s): AMMONIA in the last 168 hours. CBC: Recent Labs  Lab 02/04/19 0938 02/04/19 0938 02/04/19 2249 02/05/19 0634 02/06/19 0500 02/07/19 0735 02/08/19 0315  WBC 5.3   < > 4.2 5.1 5.9 4.9 6.9  NEUTROABS 4.0  --   --  3.9 4.1 3.2 5.9  HGB 12.9   < > 11.6* 12.1 11.9* 12.9 12.2  HCT 40.4   < > 35.8* 37.7 37.7 39.5 38.0  MCV 84.7   < > 84.6 85.1 85.9 83.5 85.4  PLT 203   < > 205 187 262 242 319   < > = values in this interval not displayed.   Cardiac Enzymes: No results for input(s): CKTOTAL, CKMB, CKMBINDEX, TROPONINI in the last 168 hours. BNP: Invalid input(s): POCBNP CBG: Recent Labs  Lab 02/07/19 1141 02/07/19 1630 02/08/19 0026 02/08/19 0746 02/08/19 1139  GLUCAP 272* 302* 242* 242* 347*   D-Dimer Recent Labs    02/07/19 0525 02/08/19 0315  DDIMER 5.38* 1.08*   Hgb A1c  Recent Labs    02/06/19 0501  HGBA1C 7.5*   Lipid Profile No results for input(s): CHOL, HDL, LDLCALC, TRIG, CHOLHDL, LDLDIRECT in the last 72 hours. Thyroid function studies No results for input(s): TSH, T4TOTAL, T3FREE, THYROIDAB in the last 72 hours.  Invalid input(s): FREET3 Anemia work up No results for input(s): VITAMINB12, FOLATE, FERRITIN, TIBC, IRON, RETICCTPCT in the last 72 hours. Urinalysis    Component Value Date/Time   BILIRUBINUR neg 12/25/2012 1319   PROTEINUR neg 12/25/2012 1319   UROBILINOGEN 0.2 12/25/2012 1319   NITRITE neg 12/25/2012 1319   LEUKOCYTESUR moderate (2+) 12/25/2012 1319    Sepsis Labs Invalid input(s): PROCALCITONIN,  WBC,  LACTICIDVEN Microbiology Recent Results (from the past 240 hour(s))  Respiratory Panel by RT PCR (Flu A&B, Covid) - Nasopharyngeal Swab     Status: Abnormal   Collection Time: 02/04/19  2:54 PM   Specimen: Nasopharyngeal Swab  Result Value Ref Range Status   SARS Coronavirus 2 by RT PCR POSITIVE (A) NEGATIVE Final    Comment: RESULT CALLED TO, READ BACK BY AND VERIFIED WITH: Celedonio SavageE. Davis RN 16:45 02/04/19 ([email or web address]) (NOTE) SARS-CoV-2 target nucleic acids are DETECTED. SARS-CoV-2 RNA is generally detectable in upper respiratory specimens  during the acute phase of infection. Positive results are indicative of the presence of the identified virus, but do not rule out bacterial infection or co-infection with other pathogens not detected by the test. Clinical correlation with patient history and other diagnostic information is necessary to determine patient infection status. The expected result is Negative. Fact Sheet for Patients:  https://www.moore.com/https://www.fda.gov/media/142436/download Fact Sheet for Healthcare Providers: https://www.young.biz/https://www.fda.gov/media/142435/download This test is not yet approved or cleared by the [place] FDA and  has been authorized for detection and/or diagnosis of SARS-CoV-2 by FDA under an Emergency Use Authorization (EUA).  This EUA will remain in effect (meaning this test can be used) f or the duration of  the COVID-19 declaration under Section 564(b)(1) of the Act, 21 U.S.C. section 360bbb-3(b)(1), unless the authorization is terminated or revoked sooner.    Influenza A by PCR NEGATIVE NEGATIVE Final   Influenza B by PCR NEGATIVE NEGATIVE Final    Comment: (NOTE) The Xpert Xpress SARS-CoV-2/FLU/RSV assay is intended as an aid in  the diagnosis of influenza from Nasopharyngeal swab specimens and  should not be used as a sole basis for treatment. Nasal washings and  aspirates are unacceptable for Xpert Xpress  SARS-CoV-2/FLU/RSV  testing. Fact Sheet for Patients: https://www.moore.com/https://www.fda.gov/media/142436/download Fact Sheet for Healthcare Providers: https://www.young.biz/https://www.fda.gov/media/142435/download This test is not yet approved or cleared by the [place] FDA and  has been authorized for detection and/or diagnosis of SARS-CoV-2 by  FDA under an Emergency Use Authorization (EUA). This EUA will remain  in effect (meaning this test can be used) for the duration of the  Covid-19 declaration under Section 564(b)(1) of the Act, 21  U.S.C. section 360bbb-3(b)(1), unless the authorization is  terminated or revoked. Performed at [facility], [address] [place], [place] [address]      Time coordinating discharge:  I have spent 35 minutes face to face with the patient and on the ward discussing the patients care, assessment, plan and disposition with other care givers. >50% of the time was devoted counseling the patient about the risks and benefits of treatment/Discharge disposition and coordinating care.   SIGNED:   Dimple NanasAnkit Chirag Arelis Neumeier, MD  [facility] 02/08/2019, 11:41 AM   If 7PM-7AM, please contact night-coverage

## 2019-02-08 NOTE — Discharge Instructions (Signed)
I have enrolled you in a Hospital-to-Home program so that you may receive ongoing assistance for your COVID-19 pneumonia.   I have also prescribed you extra strength Tylenol and naproxen that I would like you to take for your fevers, chills, and body aches.  Please do not combine naproxen with ibuprofen or other NSAIDs.  I have also given you Zofran ODT, for your nausea symptoms.  Please take as needed.  It is imperative that you continue to eat regular meals despite your lack of appetite and loss of taste.  I also cannot emphasize the importance of oral hydration.  Please be sure that you are drinking plenty of fluids.  Please return to the ED or seek medical attention immediately for any new or worsening symptoms.     Person Under Monitoring Name: [name] [facility]  Location: [address] [place] [address]   Infection Prevention Recommendations for Individuals Confirmed to have, or Being Evaluated for, 2019 Novel Coronavirus (COVID-19) Infection Who Receive Care at Home  Individuals who are confirmed to have, or are being evaluated for, COVID-19 should follow the prevention steps below until a healthcare provider or local or state health department says they can return to normal activities.  Stay home except to get medical care You should restrict activities outside your home, except for getting medical care. Do not go to work, school, or public areas, and do not use public transportation or taxis.  Call ahead before visiting your doctor Before your medical appointment, call the healthcare provider and tell them that you have, or are being evaluated for, COVID-19 infection. This will help the healthcare provider's office take steps to keep other people from getting infected. Ask your healthcare provider to call the local or state health department.  Monitor your symptoms Seek prompt medical attention if your illness is worsening (e.g., difficulty breathing). Before going to  your medical appointment, call the healthcare provider and tell them that you have, or are being evaluated for, COVID-19 infection. Ask your healthcare provider to call the local or state health department.  Wear a facemask You should wear a facemask that covers your nose and mouth when you are in the same room with other people and when you visit a healthcare provider. People who live with or visit you should also wear a facemask while they are in the same room with you.  Separate yourself from other people in your home As much as possible, you should stay in a different room from other people in your home. Also, you should use a separate bathroom, if available.  Avoid sharing household items You should not share dishes, drinking glasses, cups, eating utensils, towels, bedding, or other items with other people in your home. After using these items, you should wash them thoroughly with soap and water.  Cover your coughs and sneezes Cover your mouth and nose with a tissue when you cough or sneeze, or you can cough or sneeze into your sleeve. Throw used tissues in a lined trash can, and immediately wash your hands with soap and water for at least 20 seconds or use an alcohol-based hand rub.  Wash your [employer] your hands often and thoroughly with soap and water for at least 20 seconds. You can use an alcohol-based hand sanitizer if soap and water are not available and if your hands are not visibly dirty. Avoid touching your eyes, nose, and mouth with unwashed hands.   Prevention Steps for Caregivers and Household  Members of Individuals Confirmed to have, or Being Evaluated for, COVID-19 Infection Being Cared for in the Home  If you live with, or provide care at home for, a person confirmed to have, or being evaluated for, COVID-19 infection please follow these guidelines to prevent infection:  Follow healthcare provider's instructions Make sure that you understand and can help the  patient follow any healthcare provider instructions for all care.  Provide for the patient's basic needs You should help the patient with basic needs in the home and provide support for getting groceries, prescriptions, and other personal needs.  Monitor the patient's symptoms If they are getting sicker, call his or her medical provider and tell them that the patient has, or is being evaluated for, COVID-19 infection. This will help the healthcare provider's office take steps to keep other people from getting infected. Ask the healthcare provider to call the local or state health department.  Limit the number of people who have contact with the patient  If possible, have only one caregiver for the patient.  Other household members should stay in another home or place of residence. If this is not possible, they should stay  in another room, or be separated from the patient as much as possible. Use a separate bathroom, if available.  Restrict visitors who do not have an essential need to be in the home.  Keep older adults, very young children, and other sick people away from the patient Keep older adults, very young children, and those who have compromised immune systems or chronic health conditions away from the patient. This includes people with chronic heart, lung, or kidney conditions, diabetes, and cancer.  Ensure good ventilation Make sure that shared spaces in the home have good air flow, such as from an air conditioner or an opened window, weather permitting.  Wash your hands often  Wash your hands often and thoroughly with soap and water for at least 20 seconds. You can use an alcohol based hand sanitizer if soap and water are not available and if your hands are not visibly dirty.  Avoid touching your eyes, nose, and mouth with unwashed hands.  Use disposable paper towels to dry your hands. If not available, use dedicated cloth towels and replace them when they become  wet.  Wear a facemask and gloves  Wear a disposable facemask at all times in the room and gloves when you touch or have contact with the patient's blood, body fluids, and/or secretions or excretions, such as sweat, saliva, sputum, nasal mucus, vomit, urine, or feces.  Ensure the mask fits over your nose and mouth tightly, and do not touch it during use.  Throw out disposable facemasks and gloves after using them. Do not reuse.  Wash your hands immediately after removing your facemask and gloves.  If your personal clothing becomes contaminated, carefully remove clothing and launder. Wash your hands after handling contaminated clothing.  Place all used disposable facemasks, gloves, and other waste in a lined container before disposing them with other household waste.  Remove gloves and wash your hands immediately after handling these items.  Do not share dishes, glasses, or other household items with the patient  Avoid sharing household items. You should not share dishes, drinking glasses, cups, eating utensils, towels, bedding, or other items with a patient who is confirmed to have, or being evaluated for, COVID-19 infection.  After the person uses these items, you should wash them thoroughly with soap and water.  [place] thoroughly  Immediately remove and wash clothes or bedding that have blood, body fluids, and/or secretions or excretions, such as sweat, saliva, sputum, nasal mucus, vomit, urine, or feces, on them.  Wear gloves when handling laundry from the patient.  Read and follow directions on labels of laundry or clothing items and detergent. In general, wash and dry with the warmest temperatures recommended on the label.  Clean all areas the individual has used often  Clean all touchable surfaces, such as counters, tabletops, doorknobs, bathroom fixtures, toilets, phones, keyboards, tablets, and bedside tables, every day. Also, clean any surfaces that may have blood, body  fluids, and/or secretions or excretions on them.  Wear gloves when cleaning surfaces the patient has come in contact with.  Use a diluted bleach solution (e.g., dilute bleach with 1 part bleach and 10 parts water) or a household disinfectant with a label that says EPA-registered for coronaviruses. To make a bleach solution at home, add 1 tablespoon of bleach to 1 quart (4 cups) of water. For a larger supply, add  cup of bleach to 1 gallon (16 cups) of water.  Read labels of cleaning products and follow recommendations provided on product labels. Labels contain instructions for safe and effective use of the cleaning product including precautions you should take when applying the product, such as wearing gloves or eye protection and making sure you have good ventilation during use of the product.  Remove gloves and wash hands immediately after cleaning.  Monitor yourself for signs and symptoms of illness Caregivers and household members are considered close contacts, should monitor their health, and will be asked to limit movement outside of the home to the extent possible. Follow the monitoring steps for close contacts listed on the symptom monitoring form.   ? If you have additional questions, contact your local health department or call the epidemiologist on call at [phone] (available 24/7). ? This guidance is subject to change. For the most up-to-date guidance from [facility], please refer to their website: [email or web address]

## 2019-02-08 NOTE — TOC Transition Note (Signed)
Transition of Care [facility]) - CM/SW Discharge Note   Patient Details  Name: [name] MRN: [id number] Date of Birth: January 01, 1961  Transition of Care [facility]) CM/SW Contact:  Cherylann Parr, RN Phone Number: 02/08/2019, 11:49 AM   Clinical Narrative:  PTA completely independent from home alone.  Pt informed CM that her sister will transport her home.  Pt will contact her PCP directly and request a post discharge televisit follow up appt .  Pt is in agreement with home oxygen - CM offered choice - pt chose [employer] - referral accepted.  Pt will receive portable oxygen concentrator prior to discharge and [employer] will follow up with pt directly to arrange home equipment delivery.  Pt can discharge home prior to home equipment being delivered - phone number verified in Epic    Final next level of care: Home/Self Care Barriers to Discharge: Barriers Resolved   Patient Goals and CMS Choice Patient states their goals for this hospitalization and ongoing recovery are:: Pt infomred CM that she would CMS Medicare.gov Compare Post Acute Care list provided to:: Patient Choice offered to / list presented to : Patient  Discharge Placement                       Discharge Plan and Services                DME Arranged: Oxygen DME Agency: [facility] Date DME Agency Contacted: 02/08/19 Time DME Agency Contacted: 1147 Representative spoke with at DME Agency: Zack            Social Determinants of Health (SDOH) Interventions     Readmission Risk Interventions No flowsheet data found.

## 2019-02-08 NOTE — Progress Notes (Signed)
SATURATION QUALIFICATIONS: (This note is used to comply with regulatory documentation for home oxygen)  Patient Saturations on Room Air at Rest = 90-93%  Patient Saturations on Room Air while Ambulating = 85%  Patient Saturations on 2Liters of oxygen while Ambulating = 95%  Please briefly explain why patient needs home oxygen:  Pt sats began to drop about 100 feet into walk.  Gradually went down to 85%, then I applied 2LNC of Oxygen and Sats went up 95% while ambulating.

## 2019-02-08 NOTE — Progress Notes (Signed)
Inpatient Diabetes Program Recommendations  AACE/ADA: New Consensus Statement on Inpatient Glycemic Control (2015)  Target Ranges:  Prepandial:   less than 140 mg/dL      Peak postprandial:   less than 180 mg/dL (1-2 hours)      Critically ill patients:  140 - 180 mg/dL   Lab Results  Component Value Date   GLUCAP 242 (H) 02/08/2019   HGBA1C 7.5 (H) 02/06/2019    Review of Glycemic Control Results for [name], [name] (MRN [id number]) as of 02/08/2019 10:11  Ref. Range 02/07/2019 08:04 02/07/2019 11:41 02/07/2019 16:30 02/08/2019 00:26 02/08/2019 07:46  Glucose-Capillary Latest Ref Range: 70 - 99 mg/dL 149 (H) 702 (H) 637 (H) 242 (H) 242 (H)   Diabetes history: DM2 Outpatient Diabetes medications: Metformin 500 mg BID  Current orders for Inpatient glycemic control: Lantus 10 units q hs + Novolog moderate correction tid, Novolog 5 units tid meal coverage  Solumedrol 40 Q8 hours  Inpatient Diabetes Program Recommendations:    -Add Novolog bedtime scale -Consider increasing Lantus to 15 units -Tradjenta 5 mg qd (New research decreases mortality with COVID in DM 2, can reference article in COVID order set)  Thank you, Christena Deem RN, MSN, BC-ADM Inpatient Diabetes Coordinator Team Pager [phone] (8a-5p)

## 2019-02-08 NOTE — Progress Notes (Signed)
Pt given discharge instructions, prescriptions, and care notes. Pt verbalized understanding AEB no further questions or concerns at this time. IV was discontinued, no redness, pain, or swelling noted at this time. Telemetry discontinued and Centralized Telemetry was notified. Pt left the floor via wheelchair with staff in stable condition. 

## 2019-02-14 DIAGNOSIS — Z8616 Personal history of COVID-19: Secondary | ICD-10-CM

## 2019-02-14 HISTORY — DX: Personal history of COVID-19: Z86.16

## 2019-03-14 ENCOUNTER — Encounter: Payer: Self-pay | Admitting: Family Medicine

## 2019-03-14 ENCOUNTER — Other Ambulatory Visit: Payer: Self-pay

## 2019-03-14 ENCOUNTER — Ambulatory Visit (INDEPENDENT_AMBULATORY_CARE_PROVIDER_SITE_OTHER): Payer: 59 | Admitting: Family Medicine

## 2019-03-14 VITALS — BP 137/85 | HR 82 | Temp 97.4°F | Ht 65.0 in | Wt 239.0 lb

## 2019-03-14 DIAGNOSIS — G43709 Chronic migraine without aura, not intractable, without status migrainosus: Secondary | ICD-10-CM | POA: Diagnosis not present

## 2019-03-14 DIAGNOSIS — Z9989 Dependence on other enabling machines and devices: Secondary | ICD-10-CM

## 2019-03-14 DIAGNOSIS — G4733 Obstructive sleep apnea (adult) (pediatric): Secondary | ICD-10-CM | POA: Diagnosis not present

## 2019-03-14 DIAGNOSIS — IMO0002 Reserved for concepts with insufficient information to code with codable children: Secondary | ICD-10-CM

## 2019-03-14 NOTE — Progress Notes (Signed)
PATIENT: Jodi Laton [name] DOB: 03-22-1960  REASON FOR VISIT: follow up HISTORY FROM: patient  Chief Complaint  Patient presents with  . Follow-up    RM6. Alone. 6 month f/u. States that her CPAP has a leak. States her migraines have been okay, noticed when taking the imitrex it makes her body stiff, but helps the migraines.     HISTORY OF PRESENT ILLNESS: Today 03/14/19 [name] is a 59 y.o. female here today for follow up of OSA on CPAP and migraines. She is doing very well.  She reports near daily compliance with CPAP therapy.  She denies any difficulty with the machine with the exception of noting a leak.  She is using a DreamWear full facemask.  Over the past few weeks she has noted that the air is leaking around the mask.  She replaces her supplies regularly.  She has not reached out to the DME company.  She does feel that fatigue levels have significantly improved.  She is recently recovering from Covid pneumonia.  She was hospitalized for 5 days in January.  She reports that at baseline.  Migraines are well managed.  She continues Effexor for preventative care.  She has approximately 2-3 migraines per month.  She is using Imitrex as needed for abortive therapy.  She has both tablet and injection forms of Imitrex.  Both work very well for abortive therapy.  She does feel that Imitrex tablets make her have more joint stiffness for about 24 hours.  She does not feel that this is worrisome.  No other side effects.  No chest pain, trouble breathing or dizziness.  Compliance report dated 02/12/2019 through 03/13/2019 reveals that she used CPAP 27 of the last 30 days for compliance of 90%.  She used CPAP greater than 4 hours 26 of 30 days for compliance of 87%.  Average usage was 6 hours and 48 minutes.  Residual AHI was 0.2 on 7 to 13 cm of water and an EPR of 1.  There was a significant leak noted in the 95th percentile of 29.3 L/min.    HISTORY: (copied from my note on  09/11/2018)  [name] is a 59 y.o. female here today for follow up OSA on CPAP. She continues to have concerns of excessive daytime sleepiness and fatigue. She is compliant with therapy. She feels that she was feeling better until about 2 weeks ago. She states that she has fallen asleep twice at work. She has never done this before. She reports that once, she was dreaming. She is caring for a dog with seizures. She is up and down throughout the night. She continues Effexor 59m for migraine prevention. She is taking magnesium with Melatonin at night as well. She does not exercise. She was recently started on metformin [id number] daily. 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 She has gained about 30 pounds in the past year. She is working with [facility] to help with weight management. She is also caring for her elderly mother.   Compliance report dated 08/12/2018 through 09/10/2018 reveals that she is using CPAP nightly and greater than 4 hours each night for compliance of 100%.  Average usage is 6 hours and 56 minutes.  AHI was 0.5 on 7 to 13 cm of water and an EPR of 1.  There was no significant leak.    HISTORY: (copied from Dr AtGuadelupe Sabinote on 03/26/2018)  Interim history:  [name] is a 59 year old right-handed woman with an underlying medical history of migraine headaches, hyperlipidemia, factor V deficiency, depression, anxiety, history of DVT, history of varicose veins, and obesity, who presents for follow-up consultation of her obstructive sleep apnea, after a recent onset sleep testing and starting AutoPap therapy. The patient is unaccompanied today. I first met her on 01/22/2018 at the request of Dr. Krista Blue, at which time she reported snoring and daytime somnolence as well as witnessed apneas. Her Epworth sleepiness score was 17 at the time.  Today, 03/26/2018: I reviewed her AutoPap compliance data from 02/24/2018 through 03/25/2018 which  is a total of 30 days, during which time she used her AutoPap every night with percent used days greater than 4 hours at 100%, indicating superb compliance with an average usage of 6 hours and 46 minutes, residual AHI at goal at 0.5 per hour, 95thpercentile pressure at 11.7 cm, leak on the high side with the 95thpercentile at 19.8 L/m on a pressure range of 7 cm to 13 cm. She reports that she is still sleepy. She even feels that she is more sleepy as she has started using the AutoPap. Her Epworth sleepiness score is 19 out of 24 today. Looking back at her medication history, she restarted Effexor long-acting in September 2019, she had stopped it in or around May 2018. When she saw Dr. Krista Blue in October 2019 the Effexor was increased to 75 mg daily. She takes it at night. She does admit that it could be correlated. She also has a dog that has to be up early, she tries to keep a scheduled for this. She tries to keep a schedule on the weekends 2. She does report that her headaches are better all month of February since she has been consistent with her AutoPap. In fact, her headaches are much improved. Previously, she had been on Topamax which caused side effects and she also tried talks from migraines. She is motivated to continue with treatment. She has gained weight over time. She has a more sedentary job for the past year.   The patient's allergies, current medications, family history, past medical history, past social history, past surgical history and problem list were reviewed and updated as appropriate.  Previously:   01/22/2018: She reports snoring and excessive daytime somnolence. I reviewed your office note from 10/19/2017. She has had recurrent headaches. Her Epworth sleepiness score is 17 out of 24, fatigue score is 41 out of 63. She is divorced and lives alone, no children. She is a nonsmoker and drinks alcohol infrequently, caffeine not daily, 1-2 cups 3 or 4 times a week on average. She has a  40 minute commute and has become sleepy while driving. She works as an Lawyer. She tries to be in bed before 10 and arise time is currently around 5. She has a dog that needs to get out around that time. She has a TV in her bedroom, typically on a sleep timer, she has had some morning headaches. She has nocturia about once per average night, is not aware of any family history of OSA. She gained weight starting in 2006.Her sister has noted her loud snoring but also pauses in her breathing and patient has woken herself up with a sense of gasping for air at times.   REVIEW OF SYSTEMS: Out of a complete 14 system review of symptoms, the patient complains only of the following symptoms, joint stiffness and all other reviewed systems are negative.  Epworth sleepiness  scale: 5 Fatigue severity scale: 16  ALLERGIES: Allergies  Allergen Reactions  . Statins Other (See Comments)    Pt states she has severe joint and body aches.  . Codeine Hives    rash    HOME MEDICATIONS: Outpatient Medications Prior to Visit  Medication Sig Dispense Refill  . aspirin EC 325 MG tablet Take 325 mg by mouth daily.    . Biotin 10000 MCG TABS Take 10,000 mcg by mouth daily.     . Cholecalciferol (VITAMIN D3 PO) Take 5,000 Units by mouth daily.    . diclofenac (VOLTAREN) 75 MG EC tablet Take 1 tablet (75 mg total) by mouth 2 (two) times daily. 50 tablet 2  . Garlic 7371 MG CAPS Take 1,000 mg by mouth daily.     Marland Kitchen ibuprofen (ADVIL,MOTRIN) 200 MG tablet Take 400 mg by mouth every 6 (six) hours as needed for mild pain.    Marland Kitchen KRILL OIL PO Take 750 mg by mouth daily.    Marland Kitchen MAGNESIUM PO Take 250 mg by mouth daily.    . metFORMIN (GLUCOPHAGE) 500 MG tablet Take 500 mg by mouth 2 (two) times daily with a meal.     . metoprolol succinate (TOPROL XL) 25 MG 24 hr tablet Take 1 tablet (25 mg total) by mouth daily. 30 tablet 3  . pantoprazole (PROTONIX) 40 MG tablet Take 1 tablet (40 mg total) by mouth daily before  breakfast. 90 tablet 0  . SUMAtriptan (IMITREX STATDOSE SYSTEM) 6 MG/0.5ML SOAJ Inject 6 mg into the skin as needed. 5 mL 11  . SUMAtriptan (IMITREX) 100 MG tablet Take 1 tab at onset of migraine.  May repeat in 2 hrs, if needed.  Max dose: 2 tabs/day. This is a 90 day prescription. 27 tablet 4  . venlafaxine XR (EFFEXOR-XR) 75 MG 24 hr capsule Take 1 capsule (75 mg total) by mouth daily with breakfast. (Patient taking differently: Take 75 mg by mouth at bedtime. ) 90 capsule 4  . albuterol (VENTOLIN HFA) 108 (90 Base) MCG/ACT inhaler Inhale 2 puffs into the lungs every 6 (six) hours as needed for wheezing or shortness of breath. 18 g 0  . cimetidine (TAGAMET) 200 MG tablet Take 200 mg by mouth daily as needed.    Marland Kitchen ipratropium (ATROVENT HFA) 17 MCG/ACT inhaler Inhale 2 puffs into the lungs 4 (four) times daily. 1 Inhaler 0   No facility-administered medications prior to visit.    PAST MEDICAL HISTORY: Past Medical History:  Diagnosis Date  . Allergy   . Anxiety   . Blood clot in vein    right leg  . Chest tightness   . Depression   . Factor V deficiency ([place])   . Hyperlipidemia   . Migraine   . Morbid obesity with BMI of 40.0-44.9, adult ([place])   . Varicose vein     PAST SURGICAL HISTORY: Past Surgical History:  Procedure Laterality Date  . TUBAL LIGATION  2006  . VEIN LIGATION Bilateral     FAMILY HISTORY: Family History  Problem Relation Age of Onset  . Diabetes Mother 50       type 2  . Diabetes Father 68       type 2  . Dementia Father 29  . Hyperlipidemia Father   . Diabetes Sister   . Hyperlipidemia Sister   . Hypertension Sister   . Diabetes Brother   . Heart disease Brother   . Hyperlipidemia Brother   . Hypertension Brother   .  Breast cancer Sister     SOCIAL HISTORY: Social History   Socioeconomic History  . Marital status: Divorced    Spouse name: Not on file  . Number of children: 0  . Years of education: 66  . Highest education level: Not on  file  Occupational History  . Occupation: Higher education careers adviser: [employer]  Tobacco Use  . Smoking status: Never Smoker  . Smokeless tobacco: Never Used  Substance and Sexual Activity  . Alcohol use: Yes    Alcohol/week: 1.0 standard drinks    Types: 1 Glasses of wine per week    Comment: OCC  . Drug use: No  . Sexual activity: Not on file  Other Topics Concern  . Not on file  Social History Narrative   Patient is divorced. Patient has a high school education and works for [employer].    Right handed.   Caffeine- two daily            Social Determinants of Health   Financial Resource Strain:   . Difficulty of Paying Living Expenses: Not on file  Food Insecurity:   . Worried About Charity fundraiser in the Last Year: Not on file  . Ran Out of Food in the Last Year: Not on file  Transportation Needs:   . Lack of Transportation (Medical): Not on file  . Lack of Transportation (Non-Medical): Not on file  Physical Activity:   . Days of Exercise per Week: Not on file  . Minutes of Exercise per Session: Not on file  Stress:   . Feeling of Stress : Not on file  Social Connections:   . Frequency of Communication with Friends and Family: Not on file  . Frequency of Social Gatherings with Friends and Family: Not on file  . Attends Religious Services: Not on file  . Active Member of Clubs or Organizations: Not on file  . Attends Archivist Meetings: Not on file  . Marital Status: Not on file  Intimate Partner Violence:   . Fear of Current or Ex-Partner: Not on file  . Emotionally Abused: Not on file  . Physically Abused: Not on file  . Sexually Abused: Not on file      PHYSICAL EXAM  Vitals:   03/14/19 0753  BP: 137/85  Pulse: 82  Temp: (!) 97.4 F (36.3 C)  Weight: 239 lb (108.4 kg)  Height: 5' 5" (1.651 m)   Body mass index is 39.77 kg/m.  Generalized: Well developed, in no acute distress  Cardiology: normal rate and  rhythm, no murmur noted Respiratory: Clear to auscultation bilaterally Neurological examination  Mentation: Alert oriented to time, place, history taking. Follows all commands speech and language fluent Cranial nerve II-XII: Pupils were equal round reactive to light. Extraocular movements were full, visual field were full  Motor: The motor testing reveals 5 over 5 strength of all 4 extremities. Good symmetric motor tone is noted throughout.  Gait and station: Gait is normal.   DIAGNOSTIC DATA (LABS, IMAGING, TESTING) - I reviewed patient records, labs, notes, testing and imaging myself where available.  No flowsheet data found.   Lab Results  Component Value Date   WBC 6.9 02/08/2019   HGB 12.2 02/08/2019   HCT 38.0 02/08/2019   MCV 85.4 02/08/2019   PLT 319 02/08/2019      Component Value Date/Time   NA 136 02/08/2019 0315   NA 141 01/13/2015 0853   K 5.0  02/08/2019 0315   K 4.3 01/13/2015 0853   CL 106 02/08/2019 0315   CO2 20 (L) 02/08/2019 0315   CO2 25 01/13/2015 0853   GLUCOSE 273 (H) 02/08/2019 0315   GLUCOSE 130 01/13/2015 0853   BUN 28 (H) 02/08/2019 0315   BUN 14.3 01/13/2015 0853   CREATININE 0.83 02/08/2019 0315   CREATININE 0.9 01/13/2015 0853   CALCIUM 8.6 (L) 02/08/2019 0315   CALCIUM 9.5 01/13/2015 0853   PROT 5.8 (L) 02/08/2019 0315   PROT 7.4 01/13/2015 0853   ALBUMIN 2.6 (L) 02/08/2019 0315   ALBUMIN 3.7 01/13/2015 0853   AST 17 02/08/2019 0315   AST 12 01/13/2015 0853   ALT 18 02/08/2019 0315   ALT 14 01/13/2015 0853   ALKPHOS 59 02/08/2019 0315   ALKPHOS 102 01/13/2015 0853   BILITOT 0.5 02/08/2019 0315   BILITOT 0.48 01/13/2015 0853   GFRNONAA >60 02/08/2019 0315   GFRNONAA 83 12/25/2012 0814   GFRAA >60 02/08/2019 0315   GFRAA >89 12/25/2012 0814   Lab Results  Component Value Date   CHOL 217 (H) 12/25/2012   HDL 52 12/25/2012   LDLCALC 140 (H) 12/25/2012   TRIG 126 12/25/2012   CHOLHDL 4.2 12/25/2012   Lab Results  Component  Value Date   HGBA1C 7.5 (H) 02/06/2019   Lab Results  Component Value Date   VITAMINB12 440 01/13/2015   Lab Results  Component Value Date   TSH 3.140 12/25/2012     ASSESSMENT AND PLAN 59 y.o. year old female  has a past medical history of Allergy, Anxiety, Blood clot in vein, Chest tightness, Depression, Factor V deficiency ([place]), Hyperlipidemia, Migraine, Morbid obesity with BMI of 40.0-44.9, adult ([place]), and Varicose vein. here with     ICD-10-CM   1. OSA on CPAP  G47.33 For home use only DME continuous positive airway pressure (CPAP)   Z99.89   2. Chronic migraine  G43.709     Seaira is doing very well.  She is tolerating CPAP therapy and feels that this has helped with her daytime fatigue.  Compliance report reveals optimal compliance.  She was encouraged to use CPAP every night and for greater than 4 hours each night.  She will continue Effexor 75 mg daily for migraine prevention and Imitrex injections and tablets as needed for abortive therapy.  She will continue to monitor any concerns of side effects with Imitrex.  She does note increased joint stiffness for about 24 hours after use but no other worrisome symptoms.  We will continue to monitor.  She was encouraged to stay well-hydrated.  She will work on healthy lifestyle habits with well-balanced diet and regular exercise.  She will follow-up to see me in 6 months, sooner if needed.  She verbalizes understanding and agreement with this plan.   Orders Placed This Encounter  Procedures  . For home use only DME continuous positive airway pressure (CPAP)    Mask refitting please    Order Specific Question:   Length of Need    Answer:   Lifetime    Order Specific Question:   Patient has OSA or probable OSA    Answer:   Yes    Order Specific Question:   Is the patient currently using CPAP in the home    Answer:   Yes    Order Specific Question:   Settings    Answer:   Other see comments    Order Specific Question:   CPAP  supplies needed  Answer:   Mask, headgear, cushions, filters, heated tubing and water chamber     No orders of the defined types were placed in this encounter.     I spent 20 minutes with the patient. 50% of this time was spent counseling and educating patient on plan of care and medications.    Debbora Presto, FNP-C 03/14/2019, 8:28 AM [facility] [address], [place], [place] [address] [phone]

## 2019-03-14 NOTE — Patient Instructions (Signed)
Please continue CPAP nightly and greater than 4 hours each night. We will send mask refit orders to DME.   Continue Effexor for migraine prevention. Continue Imitrex if beneficial. Monitor side effects. Please let us know if any new or worsening side effects occur.   Stay well hydrated. Eat a well balanced diet and try to exercise regularly.   Follow up in 6 months   Sleep Apnea Sleep apnea affects breathing during sleep. It causes breathing to stop for a short time or to become shallow. It can also increase the risk of:  Heart attack.  Stroke.  Being very overweight (obese).  Diabetes.  Heart failure.  Irregular heartbeat. The goal of treatment is to help you breathe normally again. What are the causes? There are three kinds of sleep apnea:  Obstructive sleep apnea. This is caused by a blocked or collapsed airway.  Central sleep apnea. This happens when the brain does not send the right signals to the muscles that control breathing.  Mixed sleep apnea. This is a combination of obstructive and central sleep apnea. The most common cause of this condition is a collapsed or blocked airway. This can happen if:  Your throat muscles are too relaxed.  Your tongue and tonsils are too large.  You are overweight.  Your airway is too small. What increases the risk?  Being overweight.  Smoking.  Having a small airway.  Being older.  Being female.  Drinking alcohol.  Taking medicines to calm yourself (sedatives or tranquilizers).  Having family members with the condition. What are the signs or symptoms?  Trouble staying asleep.  Being sleepy or tired during the day.  Getting angry a lot.  Loud snoring.  Headaches in the morning.  Not being able to focus your mind (concentrate).  Forgetting things.  Less interest in sex.  Mood swings.  Personality changes.  Feelings of sadness (depression).  Waking up a lot during the night to pee (urinate).  Dry  mouth.  Sore throat. How is this diagnosed?  Your medical history.  A physical exam.  A test that is done when you are sleeping (sleep study). The test is most often done in a sleep lab but may also be done at home. How is this treated?   Sleeping on your side.  Using a medicine to get rid of mucus in your nose (decongestant).  Avoiding the use of alcohol, medicines to help you relax, or certain pain medicines (narcotics).  Losing weight, if needed.  Changing your diet.  Not smoking.  Using a machine to open your airway while you sleep, such as: ? An oral appliance. This is a mouthpiece that shifts your lower jaw forward. ? A CPAP device. This device blows air through a mask when you breathe out (exhale). ? An EPAP device. This has valves that you put in each nostril. ? A BPAP device. This device blows air through a mask when you breathe in (inhale) and breathe out.  Having surgery if other treatments do not work. It is important to get treatment for sleep apnea. Without treatment, it can lead to:  High blood pressure.  Coronary artery disease.  In men, not being able to have an erection (impotence).  Reduced thinking ability. Follow these instructions at home: Lifestyle  Make changes that your doctor recommends.  Eat a healthy diet.  Lose weight if needed.  Avoid alcohol, medicines to help you relax, and some pain medicines.  Do not use any products that contain  nicotine or tobacco, such as cigarettes, e-cigarettes, and chewing tobacco. If you need help quitting, ask your doctor. General instructions  Take over-the-counter and prescription medicines only as told by your doctor.  If you were given a machine to use while you sleep, use it only as told by your doctor.  If you are having surgery, make sure to tell your doctor you have sleep apnea. You may need to bring your device with you.  Keep all follow-up visits as told by your doctor. This is  important. Contact a doctor if:  The machine that you were given to use during sleep bothers you or does not seem to be working.  You do not get better.  You get worse. Get help right away if:  Your chest hurts.  You have trouble breathing in enough air.  You have an uncomfortable feeling in your back, arms, or stomach.  You have trouble talking.  One side of your body feels weak.  A part of your face is hanging down. These symptoms may be an emergency. Do not wait to see if the symptoms will go away. Get medical help right away. Call your local emergency services (911 in the [place] Do not drive yourself to the hospital. Summary  This condition affects breathing during sleep.  The most common cause is a collapsed or blocked airway.  The goal of treatment is to help you breathe normally while you sleep. This information is not intended to replace advice given to you by your health care provider. Make sure you discuss any questions you have with your health care provider. Document Revised: 10/20/2017 Document Reviewed: 08/29/2017 Elsevier Patient Education  2020 [employer].

## 2019-03-20 NOTE — Progress Notes (Signed)
I have reviewed and agreed above plan. 

## 2019-03-29 ENCOUNTER — Ambulatory Visit (INDEPENDENT_AMBULATORY_CARE_PROVIDER_SITE_OTHER): Payer: 59 | Admitting: Cardiology

## 2019-03-29 ENCOUNTER — Ambulatory Visit: Payer: 59 | Admitting: Cardiology

## 2019-03-29 ENCOUNTER — Other Ambulatory Visit: Payer: Self-pay

## 2019-03-29 ENCOUNTER — Encounter: Payer: Self-pay | Admitting: Cardiology

## 2019-03-29 VITALS — BP 130/90 | HR 89 | Temp 97.2°F | Ht 65.0 in | Wt 242.0 lb

## 2019-03-29 DIAGNOSIS — I471 Supraventricular tachycardia: Secondary | ICD-10-CM | POA: Diagnosis not present

## 2019-03-29 DIAGNOSIS — J1282 Pneumonia due to coronavirus disease 2019: Secondary | ICD-10-CM

## 2019-03-29 DIAGNOSIS — U071 COVID-19: Secondary | ICD-10-CM | POA: Diagnosis not present

## 2019-03-29 NOTE — Patient Instructions (Addendum)
Medication Instructions:  Your physician recommends that you continue on your current medications as directed. Please refer to the Current Medication list given to you today.  *If you need a refill on your cardiac medications before your next appointment, please call your pharmacy*   Lab Work: None ordered  If you have labs (blood work) drawn today and your tests are completely normal, you will receive your results only by: Marland Kitchen MyChart Message (if you have MyChart) OR . A paper copy in the mail If you have any lab test that is abnormal or we need to change your treatment, we will call you to review the results.   Testing/Procedures: None ordered   Follow-Up: At [facility], you and your health needs are our priority.  As part of our continuing mission to provide you with exceptional heart care, we have created designated Provider Care Teams.  These Care Teams include your primary Cardiologist (physician) and Advanced Practice Providers (APPs -  Physician Assistants and Nurse Practitioners) who all work together to provide you with the care you need, when you need it.  We recommend signing up for the patient portal called "MyChart".  Sign up information is provided on this After Visit Summary.  MyChart is used to connect with patients for Virtual Visits (Telemedicine).  Patients are able to view lab/test results, encounter notes, upcoming appointments, etc.  Non-urgent messages can be sent to your provider as well.   To learn more about what you can do with MyChart, go to [email or web address].    Your next appointment:   As needed  The format for your next appointment:   In Person  Provider:   Norman Herrlich, MD   Other Instructions

## 2019-03-29 NOTE — Progress Notes (Signed)
Cardiology Office Note:    Date:  03/29/2019   ID:  [name], DOB April 07, 1960, MRN [id number]  PCP:  Elizabeth Palau, FNP  Cardiologist:  Norman Herrlich, MD    Referring MD: Elizabeth Palau, FNP    ASSESSMENT:    1. Pneumonia due to COVID-19 virus   2. Paroxysmal SVT (supraventricular tachycardia) (HCC)    PLAN:    In order of problems listed above:  1. SVT well controlled continue beta-blocker.  I do not think she will require other therapy or EP intervention.  She has a good relationship with her primary care physician and what I will do to see her back in the office as needed.  He is fully recovered from COVID-19   Next appointment: As needed   Medication Adjustments/Labs and Tests Ordered: Current medicines are reviewed at length with the patient today.  Concerns regarding medicines are outlined above.  No orders of the defined types were placed in this encounter.  No orders of the defined types were placed in this encounter.   Chief Complaint  Patient presents with  . Follow-up    For SVT, recent COVID-19 hospitalization for pneumonia January 2021    History of Present Illness:    [name] is a 59 y.o. female with a hx of SVT last seen 01/03/2019.  She has initiated on beta-blocker sustained release metoprolol succinate 25 mg daily. Compliance with diet, lifestyle and medications: Yes  She was hospitalized 02/04/2019 to 02/08/2019 with COVID-19 pneumonia.  CT angio of the chest showed no evidence of pulmonary emboli but she had diffuse bilateral groundglass airspace opacities consistent with COVID-19 pneumonia.  EKG independently reviewed 02/04/2019 showed sinus tachycardia otherwise normal.  Her D-dimer was significantly elevated peaking at 5.38 as well as her CRP 8.3 she was treated with remdesivir 5-day course as well as Decadron.  A1c was elevated at 7.5 and she was continued on Metformin.  Fortunately she is fully recovered.  No shortness of breath  and during the entire time when she was acutely ill no episodes of rapid heart rhythm.  She tolerates her beta-blocker we will continue the same and I will plan to see back in the office as needed in the future. Past Medical History:  Diagnosis Date  . Allergy   . Anxiety   . Blood clot in vein    right leg  . Chest tightness   . Depression   . Factor V deficiency (HCC)   . Hyperlipidemia   . Migraine   . Morbid obesity with BMI of 40.0-44.9, adult (HCC)   . Varicose vein     Past Surgical History:  Procedure Laterality Date  . TUBAL LIGATION  2006  . VEIN LIGATION Bilateral     Current Medications: Current Meds  Medication Sig  . aspirin EC 325 MG tablet Take 325 mg by mouth daily.  . diclofenac (VOLTAREN) 75 MG EC tablet Take 1 tablet (75 mg total) by mouth 2 (two) times daily.  . Garlic 1000 MG CAPS Take 1,000 mg by mouth daily.   Marland Kitchen ibuprofen (ADVIL,MOTRIN) 200 MG tablet Take 400 mg by mouth every 6 (six) hours as needed for mild pain.  Marland Kitchen KRILL OIL PO Take 750 mg by mouth daily.  Marland Kitchen MAGNESIUM PO Take 250 mg by mouth daily.  . metFORMIN (GLUCOPHAGE) 500 MG tablet Take 500 mg by mouth 2 (two) times daily with a meal.   . SUMAtriptan (IMITREX STATDOSE SYSTEM) 6 MG/0.5ML SOAJ Inject 6  mg into the skin as needed.  . SUMAtriptan (IMITREX) 100 MG tablet Take 1 tab at onset of migraine.  May repeat in 2 hrs, if needed.  Max dose: 2 tabs/day. This is a 90 day prescription.  Marland Kitchen venlafaxine XR (EFFEXOR-XR) 75 MG 24 hr capsule Take 1 capsule (75 mg total) by mouth daily with breakfast. (Patient taking differently: Take 75 mg by mouth at bedtime. )     Allergies:   Statins and Codeine   Social History   Socioeconomic History  . Marital status: Divorced    Spouse name: Not on file  . Number of children: 0  . Years of education: 62  . Highest education level: Not on file  Occupational History  . Occupation: Engineering geologist: [employer]  Tobacco Use  .  Smoking status: Never Smoker  . Smokeless tobacco: Never Used  Substance and Sexual Activity  . Alcohol use: Yes    Alcohol/week: 1.0 standard drinks    Types: 1 Glasses of wine per week    Comment: OCC  . Drug use: No  . Sexual activity: Not on file  Other Topics Concern  . Not on file  Social History Narrative   Patient is divorced. Patient has a high school education and works for [employer].    Right handed.   Caffeine- two daily            Social Determinants of Health   Financial Resource Strain:   . Difficulty of Paying Living Expenses:   Food Insecurity:   . Worried About Programme researcher, broadcasting/film/video in the Last Year:   . Barista in the Last Year:   Transportation Needs:   . Freight forwarder (Medical):   Marland Kitchen Lack of Transportation (Non-Medical):   Physical Activity:   . Days of Exercise per Week:   . Minutes of Exercise per Session:   Stress:   . Feeling of Stress :   Social Connections:   . Frequency of Communication with Friends and Family:   . Frequency of Social Gatherings with Friends and Family:   . Attends Religious Services:   . Active Member of Clubs or Organizations:   . Attends Banker Meetings:   Marland Kitchen Marital Status:      Family History: The patient's family history includes Breast cancer in her sister; Dementia (age of onset: 29) in her father; Diabetes in her brother and sister; Diabetes (age of onset: 19) in her father; Diabetes (age of onset: 26) in her mother; Heart disease in her brother; Hyperlipidemia in her brother, father, and sister; Hypertension in her brother and sister. ROS:   Please see the history of present illness.    All other systems reviewed and are negative.  EKGs/Labs/Other Studies Reviewed:    The following studies were reviewed today:    Recent Labs: 02/07/2019: B Natriuretic Peptide 44.6 02/08/2019: ALT 18; BUN 28; Creatinine, Ser 0.83; Hemoglobin 12.2; Magnesium 2.3; Platelets 319; Potassium 5.0;  Sodium 136  Recent Lipid Panel    Component Value Date/Time   CHOL 217 (H) 12/25/2012 0814   TRIG 126 12/25/2012 0814   HDL 52 12/25/2012 0814   CHOLHDL 4.2 12/25/2012 0814   VLDL 25 12/25/2012 0814   LDLCALC 140 (H) 12/25/2012 0814    Physical Exam:    VS:  BP 130/90   Pulse 89   Temp (!) 97.2 F (36.2 C)   Ht 5\' 5"  (1.651 m)  Wt 242 lb (109.8 kg)   LMP 05/31/2011   SpO2 98%   BMI 40.27 kg/m     Wt Readings from Last 3 Encounters:  03/29/19 242 lb (109.8 kg)  03/14/19 239 lb (108.4 kg)  02/04/19 239 lb 6.7 oz (108.6 kg)     GEN:  Well nourished, well developed in no acute distress HEENT: Normal NECK: No JVD; No carotid bruits LYMPHATICS: No lymphadenopathy CARDIAC: RRR, no murmurs, rubs, gallops RESPIRATORY:  Clear to auscultation without rales, wheezing or rhonchi  ABDOMEN: Soft, non-tender, non-distended MUSCULOSKELETAL:  No edema; No deformity  SKIN: Warm and dry NEUROLOGIC:  Alert and oriented x 3 PSYCHIATRIC:  Normal affect    Signed, Shirlee More, MD  03/29/2019 4:36 PM     [facility]

## 2019-04-03 ENCOUNTER — Telehealth: Payer: Self-pay | Admitting: *Deleted

## 2019-04-03 NOTE — Telephone Encounter (Signed)
Patient is having pain (tendinitis,ankle) for the past 2 weeks and the medicine prescribed (diclofenac-75 mg) is not helping,wants to know if she should come in for an injection or get a change in medication.  Returned call to patient and informed that she should continue to take medication,ice 3-4 times a day and to scheduled a f/u appointment w/ Dr. Charlsie Merles as soon as possible.

## 2019-04-18 ENCOUNTER — Ambulatory Visit: Payer: 59 | Admitting: Podiatry

## 2019-04-22 ENCOUNTER — Ambulatory Visit: Payer: 59 | Admitting: Neurology

## 2019-04-29 ENCOUNTER — Ambulatory Visit (INDEPENDENT_AMBULATORY_CARE_PROVIDER_SITE_OTHER): Payer: 59

## 2019-04-29 ENCOUNTER — Other Ambulatory Visit: Payer: Self-pay | Admitting: Podiatry

## 2019-04-29 ENCOUNTER — Other Ambulatory Visit: Payer: Self-pay

## 2019-04-29 ENCOUNTER — Ambulatory Visit: Payer: 59 | Admitting: Podiatry

## 2019-04-29 ENCOUNTER — Encounter: Payer: Self-pay | Admitting: Podiatry

## 2019-04-29 VITALS — Temp 97.3°F

## 2019-04-29 DIAGNOSIS — M76821 Posterior tibial tendinitis, right leg: Secondary | ICD-10-CM | POA: Diagnosis not present

## 2019-04-29 DIAGNOSIS — M25571 Pain in right ankle and joints of right foot: Secondary | ICD-10-CM

## 2019-04-29 DIAGNOSIS — T148XXA Other injury of unspecified body region, initial encounter: Secondary | ICD-10-CM

## 2019-04-30 NOTE — Progress Notes (Signed)
Subjective:   Patient ID: [name], female   DOB: 59 y.o.   MRN: [id number]   HPI Patient presents stating she is getting a lot of pain on the inside of her right ankle again and it seemed to get better for period of time it is gotten better over the last couple months.  Patient states she is having trouble walking and being comfortable with this.   ROS      Objective:  Physical Exam  Neurovascular status intact with inflammation pain of the posterior tibial tendon as it comes underneath the medial malleolus with inflammation that is still present and with the possibility that there may be a tear of this tendon.  Patient feels like it is not stable     Assessment:  Acute posterior tibial tendinitis with possibility for interstitial tear of the posterior tibial tendon right     Plan:  H&P reviewed condition today I went ahead I did do a careful sheath injection to try to reduce the inflammation short-term and applied air fracture walker to completely immobilize and I am sending for MRI to rule out a tear of the posterior tibial tendon.  Patient will be seen back when we get results

## 2019-05-01 ENCOUNTER — Telehealth: Payer: Self-pay

## 2019-05-01 DIAGNOSIS — M76821 Posterior tibial tendinitis, right leg: Secondary | ICD-10-CM

## 2019-05-01 DIAGNOSIS — T148XXA Other injury of unspecified body region, initial encounter: Secondary | ICD-10-CM

## 2019-05-01 DIAGNOSIS — M25571 Pain in right ankle and joints of right foot: Secondary | ICD-10-CM

## 2019-05-01 NOTE — Telephone Encounter (Signed)
Heather:  Thank you,  Olegario Messier

## 2019-05-01 NOTE — Telephone Encounter (Signed)
Prior Authorization for MR Ankle Right W/O contrast initiated.  Case # [id number] Order, insurance, and clinical documentation faxed to [facility] @ [phone]

## 2019-05-08 NOTE — Telephone Encounter (Signed)
I informed pt our office had received a denial letter with a phone number to Appeal and I would inform Dr. Charlsie Merles of the phone number to perform the appeal for the MRI [id number] right ankle.

## 2019-05-08 NOTE — Telephone Encounter (Signed)
Pt states she received a letter from the insurance denying the MRI, has paper work for Dr. Charlsie Merles to complete.

## 2019-05-08 NOTE — Telephone Encounter (Signed)
Lets try for foot mri extending as far back as possible

## 2019-05-09 NOTE — Addendum Note (Signed)
Addended by: Alphia Kava D on: 05/09/2019 10:48 AM   Modules accepted: Orders

## 2019-05-09 NOTE — Telephone Encounter (Signed)
Orders to Hannah Beat, CMA for pre-cert and faxed to [facility].

## 2019-05-13 ENCOUNTER — Other Ambulatory Visit: Payer: Self-pay | Admitting: Cardiology

## 2019-05-14 NOTE — Telephone Encounter (Signed)
Valery:  I called to get a prior authorization.   Reference case #[id number]  This is a pending status. We have to wait for a clinical review. This usually takes 2-3 business days.  Thank you,  Hannah Beat, CMA (AAMA)

## 2019-05-16 NOTE — Telephone Encounter (Signed)
Valery:  Received a fax stating that patient was eligible for MRI.  Thank you,  Hannah Beat, CMA (AAMA)

## 2019-05-17 NOTE — Telephone Encounter (Signed)
I informed pt Dr. Charlsie Merles would like the foot and had ordered the ankle which we had hoped would cover into the foot, but I would have to put ankle to include as much of the foot as possible, but since the ankle had been denied he wanted the foot because he was looking at the insertion site.

## 2019-05-17 NOTE — Telephone Encounter (Signed)
I spoke with [facility] and she transferred to [facility] and I informed that Dr. Charlsie Merles wanted the foot.

## 2019-05-17 NOTE — Telephone Encounter (Signed)
Pt called states she needs to know if the MRI is to be of the foot or the ankle and to inform [facility].

## 2019-05-30 ENCOUNTER — Ambulatory Visit
Admission: RE | Admit: 2019-05-30 | Discharge: 2019-05-30 | Disposition: A | Discharge status: Home / Self Care | Payer: 59 | Source: Ambulatory Visit | Attending: Podiatry | Admitting: Podiatry

## 2019-05-30 ENCOUNTER — Other Ambulatory Visit: Payer: Self-pay

## 2019-05-30 ENCOUNTER — Other Ambulatory Visit: Payer: 59

## 2019-05-30 DIAGNOSIS — M76821 Posterior tibial tendinitis, right leg: Secondary | ICD-10-CM

## 2019-05-30 DIAGNOSIS — T148XXA Other injury of unspecified body region, initial encounter: Secondary | ICD-10-CM

## 2019-06-10 NOTE — Progress Notes (Signed)
Patient does have tear of the tendon and should be seen

## 2019-06-14 ENCOUNTER — Telehealth: Payer: Self-pay | Admitting: *Deleted

## 2019-06-14 NOTE — Telephone Encounter (Signed)
-----   Message from Lenn Sink, DPM sent at 06/10/2019  1:28 PM EDT ----- Patient does have tear of the tendon and should be seen

## 2019-06-14 NOTE — Telephone Encounter (Signed)
Left message for pt to call to schedule an appt to discuss MRI results and treatment.

## 2019-06-27 ENCOUNTER — Encounter: Payer: Self-pay | Admitting: Podiatry

## 2019-06-27 ENCOUNTER — Other Ambulatory Visit: Payer: Self-pay

## 2019-06-27 ENCOUNTER — Ambulatory Visit (INDEPENDENT_AMBULATORY_CARE_PROVIDER_SITE_OTHER): Payer: 59 | Admitting: Podiatry

## 2019-06-27 VITALS — Temp 97.4°F

## 2019-06-27 DIAGNOSIS — Q742 Other congenital malformations of lower limb(s), including pelvic girdle: Secondary | ICD-10-CM

## 2019-06-27 DIAGNOSIS — M76821 Posterior tibial tendinitis, right leg: Secondary | ICD-10-CM | POA: Diagnosis not present

## 2019-06-27 DIAGNOSIS — T148XXA Other injury of unspecified body region, initial encounter: Secondary | ICD-10-CM

## 2019-06-27 NOTE — Patient Instructions (Signed)
Pre-Operative Instructions  Congratulations, you have decided to take an important step towards improving your quality of life.  You can be assured that the doctors and staff at [facility] will be with you every step of the way.  Here are some important things you should know:  1. Plan to be at the surgery center/hospital at least 1 (one) hour prior to your scheduled time, unless otherwise directed by the surgical center/hospital staff.  You must have a responsible adult accompany you, remain during the surgery and drive you home.  Make sure you have directions to the surgical center/hospital to ensure you arrive on time. 2. If you are having surgery at [facility] or [facility], you will need a copy of your medical history and physical form from your family physician within one month prior to the date of surgery. We will give you a form for your primary physician to complete.  3. We make every effort to accommodate the date you request for surgery.  However, there are times where surgery dates or times have to be moved.  We will contact you as soon as possible if a change in schedule is required.   4. No aspirin/ibuprofen for one week before surgery.  If you are on aspirin, any non-steroidal anti-inflammatory medications (Mobic, Aleve, Ibuprofen) should not be taken seven (7) days prior to your surgery.  You make take Tylenol for pain prior to surgery.  5. Medications - If you are taking daily heart and blood pressure medications, seizure, reflux, allergy, asthma, anxiety, pain or diabetes medications, make sure you notify the surgery center/hospital before the day of surgery so they can tell you which medications you should take or avoid the day of surgery. 6. No food or drink after midnight the night before surgery unless directed otherwise by surgical center/hospital staff. 7. No alcoholic beverages 24-hours prior to surgery.  No smoking 24-hours prior or 24-hours after  surgery. 8. Wear loose pants or shorts. They should be loose enough to fit over bandages, boots, and casts. 9. Don't wear slip-on shoes. Sneakers are preferred. 10. Bring your boot with you to the surgery center/hospital.  Also bring crutches or a walker if your physician has prescribed it for you.  If you do not have this equipment, it will be provided for you after surgery. 11. If you have not been contacted by the surgery center/hospital by the day before your surgery, call to confirm the date and time of your surgery. 12. Leave-time from work may vary depending on the type of surgery you have.  Appropriate arrangements should be made prior to surgery with your employer. 13. Prescriptions will be provided immediately following surgery by your doctor.  Fill these as soon as possible after surgery and take the medication as directed. Pain medications will not be refilled on weekends and must be approved by the doctor. 14. Remove nail polish on the operative foot and avoid getting pedicures prior to surgery. 15. Wash the night before surgery.  The night before surgery wash the foot and leg well with water and the antibacterial soap provided. Be sure to pay special attention to beneath the toenails and in between the toes.  Wash for at least three (3) minutes. Rinse thoroughly with water and dry well with a towel.  Perform this wash unless told not to do so by your physician.  Enclosed: 1 Ice pack (please put in freezer the night before surgery)   1 Hibiclens skin cleaner     Pre-op instructions  If you have any questions regarding the instructions, please do not hesitate to call our office.  [place]: [address], [place], [place] [address] -- [phone]  [place]: [address] , [place] [address] -- [phone]  [place]: [address], [place], [place] [address] -- [phone]   Website: [email or web address] 

## 2019-06-30 NOTE — Progress Notes (Signed)
Subjective:   Patient ID: [name], female   DOB: 59 y.o.   MRN: [id number]   HPI Patient states her right ankle is still hurting her a lot and it is worse near its insertion into the navicular bone.  States that it is making it hard for her to be active and if she has any.  Of weightbearing he starts to get achy and throbbing and at times swells neuro   ROS      Objective:  Physical Exam  Vascular status intact with patient found to have exquisite discomfort of the posterior tibial tendon as it comes under the medial malleolus and then inserts into the navicular.  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  I explained this to her and the fact is been present for over a year and at this point it would probably be best corrected and she agrees with me and wants this done.  I allowed her to read consent form understanding all possible alternative treatments complications and the fact that this is related to probable injury weight and foot structure.  I recommended inspecting and probably tightening up the posterior tibial tendon with the possibility that we will excise a accessory navicular and possibly put a corkscrew anchor and in order to tighten it to the navicular bone I reviewed this is a difficult surgery until recovery can take 6 months to 1 year and there is absolutely no long-term guarantees this will solve the problem.  She is related except this risk want surgery signed consent form to schedule for surgery in the next few weeks

## 2019-09-11 ENCOUNTER — Encounter: Payer: Self-pay | Admitting: Family Medicine

## 2019-09-11 ENCOUNTER — Other Ambulatory Visit: Payer: Self-pay

## 2019-09-11 ENCOUNTER — Ambulatory Visit (INDEPENDENT_AMBULATORY_CARE_PROVIDER_SITE_OTHER): Payer: 59 | Admitting: Family Medicine

## 2019-09-11 VITALS — BP 128/86 | HR 72 | Ht 65.0 in | Wt 241.8 lb

## 2019-09-11 DIAGNOSIS — G4733 Obstructive sleep apnea (adult) (pediatric): Secondary | ICD-10-CM

## 2019-09-11 DIAGNOSIS — G43719 Chronic migraine without aura, intractable, without status migrainosus: Secondary | ICD-10-CM | POA: Diagnosis not present

## 2019-09-11 DIAGNOSIS — Z9989 Dependence on other enabling machines and devices: Secondary | ICD-10-CM

## 2019-09-11 MED ORDER — SUMATRIPTAN SUCCINATE 100 MG PO TABS: ORAL_TABLET | ORAL | 4 refills | Status: DC

## 2019-09-11 MED ORDER — VENLAFAXINE HCL ER 75 MG PO CP24: 75.0000 mg | ORAL_CAPSULE | Freq: Every day | ORAL | 4 refills | Status: DC

## 2019-09-11 MED ORDER — SUMATRIPTAN SUCCINATE 6 MG/0.5ML ~~LOC~~ SOAJ: 6.0000 mg | SUBCUTANEOUS | 11 refills | Status: DC | PRN

## 2019-09-11 NOTE — Patient Instructions (Signed)
Please continue using your CPAP regularly. While your insurance requires that you use CPAP at least 4 hours each night on 70% of the nights, I recommend, that you not skip any nights and use it throughout the night if you can. Getting used to CPAP and staying with the treatment long term does take time and patience and discipline. Untreated obstructive sleep apnea when it is moderate to severe can have an adverse impact on cardiovascular health and raise her risk for heart disease, arrhythmias, hypertension, congestive heart failure, stroke and diabetes. Untreated obstructive sleep apnea causes sleep disruption, nonrestorative sleep, and sleep deprivation. This can have an impact on your day to day functioning and cause daytime sleepiness and impairment of cognitive function, memory loss, mood disturbance, and problems focussing. Using CPAP regularly can improve these symptoms.   I will send orders for a mask refitting today.   Continue working on healthy lifestyle habits.   Follow up in 1 year    Sleep Apnea Sleep apnea affects breathing during sleep. It causes breathing to stop for a short time or to become shallow. It can also increase the risk of:  Heart attack.  Stroke.  Being very overweight (obese).  Diabetes.  Heart failure.  Irregular heartbeat. The goal of treatment is to help you breathe normally again. What are the causes? There are three kinds of sleep apnea:  Obstructive sleep apnea. This is caused by a blocked or collapsed airway.  Central sleep apnea. This happens when the brain does not send the right signals to the muscles that control breathing.  Mixed sleep apnea. This is a combination of obstructive and central sleep apnea. The most common cause of this condition is a collapsed or blocked airway. This can happen if:  Your throat muscles are too relaxed.  Your tongue and tonsils are too large.  You are overweight.  Your airway is too small. What  increases the risk?  Being overweight.  Smoking.  Having a small airway.  Being older.  Being female.  Drinking alcohol.  Taking medicines to calm yourself (sedatives or tranquilizers).  Having family members with the condition. What are the signs or symptoms?  Trouble staying asleep.  Being sleepy or tired during the day.  Getting angry a lot.  Loud snoring.  Headaches in the morning.  Not being able to focus your mind (concentrate).  Forgetting things.  Less interest in sex.  Mood swings.  Personality changes.  Feelings of sadness (depression).  Waking up a lot during the night to pee (urinate).  Dry mouth.  Sore throat. How is this diagnosed?  Your medical history.  A physical exam.  A test that is done when you are sleeping (sleep study). The test is most often done in a sleep lab but may also be done at home. How is this treated?   Sleeping on your side.  Using a medicine to get rid of mucus in your nose (decongestant).  Avoiding the use of alcohol, medicines to help you relax, or certain pain medicines (narcotics).  Losing weight, if needed.  Changing your diet.  Not smoking.  Using a machine to open your airway while you sleep, such as: ? An oral appliance. This is a mouthpiece that shifts your lower jaw forward. ? A CPAP device. This device blows air through a mask when you breathe out (exhale). ? An EPAP device. This has valves that you put in each nostril. ? A BPAP device. This device blows air through a  mask when you breathe in (inhale) and breathe out.  Having surgery if other treatments do not work. It is important to get treatment for sleep apnea. Without treatment, it can lead to:  High blood pressure.  Coronary artery disease.  In men, not being able to have an erection (impotence).  Reduced thinking ability. Follow these instructions at home: Lifestyle  Make changes that your doctor recommends.  Eat a healthy  diet.  Lose weight if needed.  Avoid alcohol, medicines to help you relax, and some pain medicines.  Do not use any products that contain nicotine or tobacco, such as cigarettes, e-cigarettes, and chewing tobacco. If you need help quitting, ask your doctor. General instructions  Take over-the-counter and prescription medicines only as told by your doctor.  If you were given a machine to use while you sleep, use it only as told by your doctor.  If you are having surgery, make sure to tell your doctor you have sleep apnea. You may need to bring your device with you.  Keep all follow-up visits as told by your doctor. This is important. Contact a doctor if:  The machine that you were given to use during sleep bothers you or does not seem to be working.  You do not get better.  You get worse. Get help right away if:  Your chest hurts.  You have trouble breathing in enough air.  You have an uncomfortable feeling in your back, arms, or stomach.  You have trouble talking.  One side of your body feels weak.  A part of your face is hanging down. These symptoms may be an emergency. Do not wait to see if the symptoms will go away. Get medical help right away. Call your local emergency services (911 in the [place] Do not drive yourself to the hospital. Summary  This condition affects breathing during sleep.  The most common cause is a collapsed or blocked airway.  The goal of treatment is to help you breathe normally while you sleep. This information is not intended to replace advice given to you by your health care provider. Make sure you discuss any questions you have with your health care provider. Document Revised: 10/20/2017 Document Reviewed: 08/29/2017 Elsevier Patient Education  [place].

## 2019-09-11 NOTE — Progress Notes (Addendum)
PATIENT: [name] [facility] DOB: [birth year]  REASON FOR VISIT: follow up HISTORY FROM: patient  Chief Complaint  Patient presents with  . Follow-up    6 month f/u for OSA and migraines. States she [name] still getting tired during the day at random times. Denies any issues with cpap.   . room 1    alone      HISTORY OF PRESENT ILLNESS: Today 09/11/19 [name] a 59 y.o. female here today for follow up for migraines and OSA on CPAP. She [name] doing well. Migraines are well managed with sumatriptan for abortive therapy. She [name] also taking venlafaxine 70m at bedtime. She [name] using CPAP nightly. She continues to note a leak with her Dreamwear FFM. She has days where she feels really tired. She feels this [name] related to her sleep patterns. She [name] working on getting to bed around 9pm consistently. She wakes around 5.   Compliance report dated 08/11/2019 through 09/09/2019 reveals that she used CPAP 30 of the past 30 days for compliance of 100%.  She used CPAP greater than 4 hours 29 of the last 30 days for compliance of 97%.  Average usage was 6 hours and 42 minutes.  Residual AHI was 0.8 on 7 to 13 cm of water and an EPR of 1.  There was a leak noted in the 95th percentile of 32.2.  HISTORY: (copied from my note on 03/14/2019)  [name] a 59y.o. female here today for follow up of OSA on CPAP and migraines. She [name] doing very well.  She reports near daily compliance with CPAP therapy.  She denies any difficulty with the machine with the exception of noting a leak.  She [name] using a DreamWear full facemask.  Over the past few weeks she has noted that the air [name] leaking around the mask.  She replaces her supplies regularly.  She has not reached out to the DME company.  She does feel that fatigue levels have significantly improved.  She [name] recently recovering from Covid pneumonia.  She was hospitalized for 5 days in January.  She reports that at baseline.  Migraines are well managed.  She  continues Effexor for preventative care.  She has approximately 2-3 migraines per month.  She [name] using Imitrex as needed for abortive therapy.  She has both tablet and injection forms of Imitrex.  Both work very well for abortive therapy.  She does feel that Imitrex tablets make her have more joint stiffness for about 24 hours.  She does not feel that this [name] worrisome.  No other side effects.  No chest pain, trouble breathing or dizziness.  Compliance report dated 02/12/2019 through 03/13/2019 reveals that she used CPAP 27 of the last 30 days for compliance of 90%.  She used CPAP greater than 4 hours 26 of 30 days for compliance of 87%.  Average usage was 6 hours and 48 minutes.  Residual AHI was 0.2 on 7 to 13 cm of water and an EPR of 1.  There was a significant leak noted in the 95th percentile of 29.3 L/min.    HISTORY: (copied from my note on 09/11/2018)  [name] a 59y.o.femalehere today for follow up OSA on CPAP.She continues to have concerns of excessive daytime sleepiness and fatigue. She [name] compliant with therapy. She feels that she was feeling better until about 2 weeks ago. She states that she has fallen asleep twice at work. She has never  done this before. She reports that once, she was dreaming. She [name] caring for a dog with seizures. She [name] up and down throughout the night. She continues Effexor 46m for migraine prevention. She [name] taking magnesium with Melatonin at night as well. She does not exercise. She was recently started on metformin 10044mtwice daily. 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 She has gained about 30 pounds in the past year. She [name] working with [facility] to help with weight management. She [name] also caring for her elderly mother.  Compliance report dated 08/12/2018 through 09/10/2018 reveals that she [name] using CPAP nightly and greater than 4 hours each night for compliance of 100%. Average usage [name] 6 hours and  56 minutes. AHI was 0.5 on 7 to 13 cm of water and an EPR of 1. There was no significant leak.   HISTORY: (copied fromDr Athar'snote on 03/26/2018)  Interim history:  [name] a [age]ear old right-handed woman with an underlying medical history of migraine headaches, hyperlipidemia, factor V deficiency, depression, anxiety, history of DVT, history of varicose veins, and obesity, who presents for follow-up consultation of her obstructive sleep apnea, after a recent onset sleep testing and starting AutoPap therapy. The patient [name] unaccompanied today. I first met her on 01/22/2018 at the request of Dr. YaKrista Blueat which time she reported snoring and daytime somnolence as well as witnessed apneas. Her Epworth sleepiness score was 17 at the time.  Today, 03/26/2018: I reviewed her AutoPap compliance data from 02/24/2018 through 03/25/2018 which [name] a total of 30 days, during which time she used her AutoPap every night with percent used days greater than 4 hours at 100%, indicating superb compliance with an average usage of 6 hours and 46 minutes, residual AHI at goal at 0.5 per hour, 95thpercentile pressure at 11.7 cm, leak on the high side with the 95thpercentile at 19.8 L/m on a pressure range of 7 cm to 13 cm. She reports that she [name] still sleepy. She even feels that she [name] more sleepy as she has started using the AutoPap. Her Epworth sleepiness score [name] 19 out of 24 today. Looking back at her medication history, she restarted Effexor long-acting in September 2019, she had stopped it in or around May 2018. When she saw Dr. YaKrista Bluen October 2019 the Effexor was increased to 75 mg daily. She takes it at night. She does admit that it could be correlated. She also has a dog that has to be up early, she tries to keep a scheduled for this. She tries to keep a schedule on the weekends 2. She does report that her headaches are better all month of February since she has been consistent with her AutoPap. In  fact, her headaches are much improved. Previously, she had been on Topamax which caused side effects and she also tried talks from migraines. She [name] motivated to continue with treatment. She has gained weight over time. She has a more sedentary job for the past year.   The patient's allergies, current medications, family history, past medical history, past social history, past surgical history and problem list were reviewed and updated as appropriate.  Previously:   01/22/2018: She reports snoring and excessive daytime somnolence. I reviewed your office note from 10/19/2017. She has had recurrent headaches. Her Epworth sleepiness score [name] 17 out of 24, fatigue score [name] 41 out of 63. She [name] divorced and lives alone, no children. She [name]  a nonsmoker and drinks alcohol infrequently, caffeine not daily, 1-2 cups 3 or 4 times a week on average. She has a 40 minute commute and has become sleepy while driving. She works as an Lawyer. She tries to be in bed before 10 and arise time [name] currently around 5. She has a dog that needs to get out around that time. She has a TV in her bedroom, typically on a sleep timer, she has had some morning headaches. She has nocturia about once per average night, [name] not aware of any family history of OSA. She gained weight starting in 2006.Her sister has noted her loud snoring but also pauses in her breathing and patient has woken herself up with a sense of gasping for air at times.    REVIEW OF SYSTEMS: Out of a complete 14 system review of symptoms, the patient complains only of the following symptoms, headaches, fatigue, joint and muscle aches and all other reviewed systems are negative.  ESS: 9 FSS: 40  ALLERGIES: Allergies  Allergen Reactions  . Statins Other (See Comments)    Pt states she has severe joint and body aches.  . Codeine Hives    rash    HOME MEDICATIONS: Outpatient Medications Prior to Visit  Medication Sig Dispense Refill  .  aspirin EC 325 MG tablet Take 325 mg by mouth daily.    . Cholecalciferol (VITAMIN D3 PO) Take 5,000 Units by mouth daily.    . furosemide (LASIX) 20 MG tablet Take 20 mg by mouth daily.    . Garlic 2876 MG CAPS Take 1,000 mg by mouth daily.     Marland Kitchen ibuprofen (ADVIL,MOTRIN) 200 MG tablet Take 400 mg by mouth every 6 (six) hours as needed for mild pain.    Marland Kitchen KRILL OIL PO Take 750 mg by mouth daily.    . metFORMIN (GLUCOPHAGE) 1000 MG tablet Take 1,000 mg by mouth 2 (two) times daily.    . metoprolol succinate (TOPROL-XL) 25 MG 24 hr tablet TAKE ONE TABLET BY MOUTH ONE TIME DAILY  30 tablet 4  . SUMAtriptan (IMITREX STATDOSE SYSTEM) 6 MG/0.5ML SOAJ Inject 6 mg into the skin as needed. 5 mL 11  . SUMAtriptan (IMITREX) 100 MG tablet Take 1 tab at onset of migraine.  May repeat in 2 hrs, if needed.  Max dose: 2 tabs/day. This [name] a 90 day prescription. 27 tablet 4  . venlafaxine XR (EFFEXOR-XR) 75 MG 24 hr capsule Take 1 capsule (75 mg total) by mouth daily with breakfast. (Patient taking differently: Take 75 mg by mouth at bedtime. ) 90 capsule 4  . Biotin 10000 MCG TABS Take 10,000 mcg by mouth daily.     Marland Kitchen MAGNESIUM PO Take 250 mg by mouth daily.    . pantoprazole (PROTONIX) 40 MG tablet Take 1 tablet (40 mg total) by mouth daily before breakfast. 90 tablet 0   No facility-administered medications prior to visit.    PAST MEDICAL HISTORY: Past Medical History:  Diagnosis Date  . Allergy   . Anxiety   . Blood clot in vein    right leg  . Chest tightness   . Depression   . Factor V deficiency ([place])   . Hyperlipidemia   . Migraine   . Morbid obesity with BMI of 40.0-44.9, adult ([place])   . Varicose vein     PAST SURGICAL HISTORY: Past Surgical History:  Procedure Laterality Date  . TUBAL LIGATION  2006  . VEIN LIGATION Bilateral  FAMILY HISTORY: Family History  Problem Relation Age of Onset  . Diabetes Mother 66       type 2  . Diabetes Father 37       type 2  . Dementia  Father 38  . Hyperlipidemia Father   . Diabetes Sister   . Hyperlipidemia Sister   . Hypertension Sister   . Diabetes Brother   . Heart disease Brother   . Hyperlipidemia Brother   . Hypertension Brother   . Breast cancer Sister     SOCIAL HISTORY: Social History   Socioeconomic History  . Marital status: Divorced    Spouse name: Not on file  . Number of children: 0  . Years of education: 2  . Highest education level: Not on file  Occupational History  . Occupation: Higher education careers adviser: [employer]  Tobacco Use  . Smoking status: Never Smoker  . Smokeless tobacco: Never Used  Vaping Use  . Vaping Use: Never used  Substance and Sexual Activity  . Alcohol use: Yes    Alcohol/week: 1.0 standard drink    Types: 1 Glasses of wine per week    Comment: OCC  . Drug use: No  . Sexual activity: Not on file  Other Topics Concern  . Not on file  Social History Narrative   Patient [name] divorced. Patient has a high school education and works for [employer].    Right handed.   Caffeine- two daily            Social Determinants of Health   Financial Resource Strain:   . Difficulty of Paying Living Expenses: Not on file  Food Insecurity:   . Worried About Charity fundraiser in the Last Year: Not on file  . Ran Out of Food in the Last Year: Not on file  Transportation Needs:   . Lack of Transportation (Medical): Not on file  . Lack of Transportation (Non-Medical): Not on file  Physical Activity:   . Days of Exercise per Week: Not on file  . Minutes of Exercise per Session: Not on file  Stress:   . Feeling of Stress : Not on file  Social Connections:   . Frequency of Communication with Friends and Family: Not on file  . Frequency of Social Gatherings with Friends and Family: Not on file  . Attends Religious Services: Not on file  . Active Member of Clubs or Organizations: Not on file  . Attends Archivist Meetings: Not on file  .  Marital Status: Not on file  Intimate Partner Violence:   . Fear of Current or Ex-Partner: Not on file  . Emotionally Abused: Not on file  . Physically Abused: Not on file  . Sexually Abused: Not on file      PHYSICAL EXAM  Vitals:   09/11/19 0726  BP: 128/86  Pulse: 72  Weight: 241 lb 12.8 oz (109.7 kg)  Height: 5' 5"  (1.651 m)   Body mass index [name] 40.24 kg/m.  Generalized: Well developed, in no acute distress  Cardiology: normal rate and rhythm, no murmur noted Respiratory: clear to auscultation bilaterally  Neurological examination  Mentation: Alert oriented to time, place, history taking. Follows all commands speech and language fluent Cranial nerve II-XII: Pupils were equal round reactive to light. Extraocular movements were full, visual field were full  Motor: The motor testing reveals 5 over 5 strength of all 4 extremities. Good symmetric motor tone [name] noted throughout.  Gait and station: Gait [name] normal.   DIAGNOSTIC DATA (LABS, IMAGING, TESTING) - I reviewed patient records, labs, notes, testing and imaging myself where available.  No flowsheet data found.   Lab Results  Component Value Date   WBC 6.9 02/08/2019   HGB 12.2 02/08/2019   HCT 38.0 02/08/2019   MCV 85.4 02/08/2019   PLT 319 02/08/2019      Component Value Date/Time   NA 136 02/08/2019 0315   NA 141 01/13/2015 0853   K 5.0 02/08/2019 0315   K 4.3 01/13/2015 0853   CL 106 02/08/2019 0315   CO2 20 (L) 02/08/2019 0315   CO2 25 01/13/2015 0853   GLUCOSE 273 (H) 02/08/2019 0315   GLUCOSE 130 01/13/2015 0853   BUN 28 (H) 02/08/2019 0315   BUN 14.3 01/13/2015 0853   CREATININE 0.83 02/08/2019 0315   CREATININE 0.9 01/13/2015 0853   CALCIUM 8.6 (L) 02/08/2019 0315   CALCIUM 9.5 01/13/2015 0853   PROT 5.8 (L) 02/08/2019 0315   PROT 7.4 01/13/2015 0853   ALBUMIN 2.6 (L) 02/08/2019 0315   ALBUMIN 3.7 01/13/2015 0853   AST 17 02/08/2019 0315   AST 12 01/13/2015 0853   ALT 18 02/08/2019 0315     ALT 14 01/13/2015 0853   ALKPHOS 59 02/08/2019 0315   ALKPHOS 102 01/13/2015 0853   BILITOT 0.5 02/08/2019 0315   BILITOT 0.48 01/13/2015 0853   GFRNONAA >60 02/08/2019 0315   GFRNONAA 83 12/25/2012 0814   GFRAA >60 02/08/2019 0315   GFRAA >89 12/25/2012 0814   Lab Results  Component Value Date   CHOL 217 (H) 12/25/2012   HDL 52 12/25/2012   LDLCALC 140 (H) 12/25/2012   TRIG 126 12/25/2012   CHOLHDL 4.2 12/25/2012   Lab Results  Component Value Date   HGBA1C 7.5 (H) 02/06/2019   Lab Results  Component Value Date   VITAMINB12 440 01/13/2015   Lab Results  Component Value Date   TSH 3.140 12/25/2012       ASSESSMENT AND PLAN 59 y.o. year old female  has a past medical history of Allergy, Anxiety, Blood clot in vein, Chest tightness, Depression, Factor V deficiency ([place]), Hyperlipidemia, Migraine, Morbid obesity with BMI of 40.0-44.9, adult ([place]), and Varicose vein. here with     ICD-10-CM   1. OSA on CPAP  G47.33 For home use only DME continuous positive airway pressure (CPAP)   Z99.89   2. 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  Migraines are well managed.  She continues venlafaxine 75 mg every day and sumatriptan as needed.  We will continue current treatment plan.  Compliance report reveals excellent compliance.  She was encouraged to continue using CPAP nightly and for greater than 4 hours each night.  We have reviewed possible contributors to feeling tired throughout the day.  I have encouraged her to focus on healthy lifestyle habits.  Adequate hydration, well-balanced diet and sleep hygiene reviewed.  She will continue close follow-up with her primary care provider.  She will follow-up with [place] in 1 year, sooner if needed.  She verbalizes  understanding and agreement with this plan.   Orders Placed This Encounter  Procedures  . For home use only DME continuous positive airway pressure (CPAP)  Mask refitting    Order Specific Question:   Length of Need    Answer:   Lifetime    Order Specific Question:   Patient has OSA or probable OSA    Answer:   Yes    Order Specific Question:   [name] the patient currently using CPAP in the home    Answer:   Yes    Order Specific Question:   Settings    Answer:   Other see comments    Order Specific Question:   CPAP supplies needed    Answer:   Mask, headgear, cushions, filters, heated tubing and water chamber     Meds ordered this encounter  Medications  . venlafaxine XR (EFFEXOR-XR) 75 MG 24 hr capsule    Sig: Take 1 capsule (75 mg total) by mouth at bedtime.    Dispense:  90 capsule    Refill:  4    Order Specific Question:   Supervising Provider    Answer:   Melvenia Beam [id number]  . SUMAtriptan (IMITREX) 100 MG tablet    Sig: Take 1 tab at onset of migraine.  May repeat in 2 hrs, if needed.  Max dose: 2 tabs/day. This [name] a 90 day prescription.    Dispense:  27 tablet    Refill:  4    #9/30 days.    Order Specific Question:   Supervising Provider    Answer:   Melvenia Beam [id number]  . SUMAtriptan (IMITREX STATDOSE SYSTEM) 6 MG/0.5ML SOAJ    Sig: Inject 6 mg into the skin as needed.    Dispense:  5 mL    Refill:  11    Order Specific Question:   Supervising Provider    Answer:   Melvenia Beam [id number]      I spent 15 minutes with the patient. 50% of this time was spent counseling and educating patient on plan of care and medications.    Debbora Presto, FNP-C 09/11/2019, 8:09 AM [facility] [address] [place], [place] [address] [phone]  I reviewed the above note and documentation by the Nurse Practitioner and agree with the history, exam, assessment and plan as outlined above. I was available for consultation. Star Age,  MD, PhD [facility])

## 2019-09-11 NOTE — Progress Notes (Signed)
Order for cpap supplies sent to [facility] via community msg. Confirmation received that the order transmitted was successful. 

## 2019-10-12 ENCOUNTER — Other Ambulatory Visit: Payer: Self-pay | Admitting: Cardiology

## 2019-10-14 NOTE — Telephone Encounter (Signed)
Metoprolol Succinate ER 25 mg # 90 x 3 refill to [facility]

## 2019-11-05 DIAGNOSIS — E785 Hyperlipidemia, unspecified: Secondary | ICD-10-CM | POA: Insufficient documentation

## 2019-11-05 DIAGNOSIS — G43909 Migraine, unspecified, not intractable, without status migrainosus: Secondary | ICD-10-CM | POA: Insufficient documentation

## 2019-11-05 DIAGNOSIS — D682 Hereditary deficiency of other clotting factors: Secondary | ICD-10-CM | POA: Insufficient documentation

## 2019-11-05 DIAGNOSIS — F419 Anxiety disorder, unspecified: Secondary | ICD-10-CM | POA: Insufficient documentation

## 2019-11-05 DIAGNOSIS — R0789 Other chest pain: Secondary | ICD-10-CM | POA: Insufficient documentation

## 2019-11-05 DIAGNOSIS — T7840XA Allergy, unspecified, initial encounter: Secondary | ICD-10-CM | POA: Insufficient documentation

## 2019-11-05 DIAGNOSIS — F32A Depression, unspecified: Secondary | ICD-10-CM | POA: Insufficient documentation

## 2019-11-05 DIAGNOSIS — I8290 Acute embolism and thrombosis of unspecified vein: Secondary | ICD-10-CM | POA: Insufficient documentation

## 2019-11-12 DIAGNOSIS — M7062 Trochanteric bursitis, left hip: Secondary | ICD-10-CM

## 2019-11-12 DIAGNOSIS — M7061 Trochanteric bursitis, right hip: Secondary | ICD-10-CM

## 2019-11-12 HISTORY — DX: Trochanteric bursitis, right hip: M70.61

## 2019-11-12 HISTORY — DX: Trochanteric bursitis, left hip: M70.62

## 2019-11-19 NOTE — Progress Notes (Signed)
I have reviewed and agreed above plan. 

## 2019-11-28 ENCOUNTER — Other Ambulatory Visit: Payer: Self-pay

## 2019-12-01 NOTE — Progress Notes (Signed)
Cardiology Office Note:    Date:  12/02/2019   ID:  [name], DOB 03/02/1960, MRN [id number]  PCP:  Elizabeth Palau, FNP  Cardiologist:  Norman Herrlich, MD    Referring MD: Elizabeth Palau, FNP    ASSESSMENT:    1. Paroxysmal SVT (supraventricular tachycardia) (HCC)   2. Pure hypercholesterolemia    PLAN:    In order of problems listed above:  1. HLD, stable: tolerating crestor mg daily, continue Crestor; patient adivsed to continue to work in physical activity during breaks at work, also recommended DM agents, GLP1 agonists to help with weight loss. Patient will discuss with PCP 2.   PSVT, stable: Patient is not experiencing prolonged periods of SVT. Tolerating metoprolol well. Continue metoprolol succinate 25mg  daily  3. Factor 5 Leiden Mutation: patient counseled to decrease daily ASA from 325 to 81mg    Next appointment: 1 year   Medication Adjustments/Labs and Tests Ordered: Current medicines are reviewed at length with the patient today.  Concerns regarding medicines are outlined above.  Orders Placed This Encounter  Procedures  . EKG 12-Lead   No orders of the defined types were placed in this encounter.   Chief Complaint  Patient presents with  . Follow-up    SVT    History of Present Illness:    [name] is a 59 y.o. female with a hx of SVT  and Covid with pneumonia last seen 03/29/2019.  Patient reports having intermittent episodes of tachycardia that quickly resolves. She reports tolerating medications well without adverse effects.  Today, she is inquiring about medication to help with her weight loss journey as she is having a hard time exercising due to sitting demands of her job. She has discussed this with her PCP who recommended that she discuss a medication with cardiology prior to starting an agent.   Compliance  03/29/1998 with diet, lifestyle and medications: yes  Past Medical History:  Diagnosis Date  . Allergy   . Anxiety   .  Atypical chest pain 08/24/2011  . Blood clot in vein    right leg  . Chest tightness   . Chronic migraine 10/19/2017  . Chronic venous insufficiency 05/23/2013   12/19/2017, MD Continue all current conservative treatment. 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 Seeking approval for this treatment and will proceed as soon as is approval is obtained.   . Depression   . Diabetes (HCC) 07/17/2014  . Factor 5 Leiden mutation, heterozygous (HCC) 07/17/2014  . Factor V deficiency (HCC)   . History of adverse effect of venous thromboembolism (VTE) prophylaxis 02/04/2019  . History of COVID-19 02/14/2019  . Hypercholesterolemia 08/24/2011  . Hyperlipidemia   . Migraine   . Migraine, chronic, without aura, intractable 09/13/2012   Botox approved diagnosis.   . Morbid obesity with BMI of 40.0-44.9, adult (HCC)   . Neuralgia 07/30/2015  . Paroxysmal SVT (supraventricular tachycardia) (HCC) 02/04/2019  . Pneumonia due to COVID-19 virus 02/04/2019  . Sleep apnea 10/19/2017  . Type 2 diabetes mellitus without complication, without long-term current use of insulin (HCC) 09/08/2018  . Varicose vein   . Venous thrombosis 12/10/2013   Occurred after treatment for varicose veins, she is on xarelto   . Vitamin D deficiency 07/17/2014    Past Surgical History:  Procedure Laterality Date  . TUBAL LIGATION  2006  . VEIN LIGATION Bilateral     Current Medications: Current Meds  Medication Sig  .  aspirin EC 325 MG tablet Take 325 mg by mouth daily.  . Cholecalciferol (VITAMIN D3 PO) Take 5,000 Units by mouth daily.  . furosemide (LASIX) 20 MG tablet Take 20 mg by mouth daily.  . Garlic 1000 MG CAPS Take 1,000 mg by mouth daily.   Marland Kitchen ibuprofen (ADVIL,MOTRIN) 200 MG tablet Take 400 mg by mouth every 6 (six) hours as needed for mild pain.  Marland Kitchen KRILL OIL PO Take 750 mg by mouth daily.  . metFORMIN (GLUCOPHAGE) 500 MG tablet metformin 500 mg  tablet  . metoprolol succinate (TOPROL-XL) 25 MG 24 hr tablet TAKE ONE TABLET BY MOUTH ONE TIME DAILY  . rosuvastatin (CRESTOR) 10 MG tablet Take by mouth.  . SUMAtriptan (IMITREX STATDOSE SYSTEM) 6 MG/0.5ML SOAJ Inject 6 mg into the skin as needed.  . SUMAtriptan (IMITREX) 100 MG tablet Take 1 tab at onset of migraine.  May repeat in 2 hrs, if needed.  Max dose: 2 tabs/day. This is a 90 day prescription.  Marland Kitchen venlafaxine XR (EFFEXOR-XR) 75 MG 24 hr capsule Take 1 capsule (75 mg total) by mouth at bedtime.     Allergies:   Statins and Codeine   Social History   Socioeconomic History  . Marital status: Divorced    Spouse name: Not on file  . Number of children: 0  . Years of education: 23  . Highest education level: Not on file  Occupational History  . Occupation: Engineering geologist: [employer]  Tobacco Use  . Smoking status: Never Smoker  . Smokeless tobacco: Never Used  Vaping Use  . Vaping Use: Never used  Substance and Sexual Activity  . Alcohol use: Yes    Alcohol/week: 1.0 standard drink    Types: 1 Glasses of wine per week    Comment: OCC  . Drug use: No  . Sexual activity: Not on file  Other Topics Concern  . Not on file  Social History Narrative   Patient is divorced. Patient has a high school education and works for [employer].    Right handed.   Caffeine- two daily            Social Determinants of Health   Financial Resource Strain:   . Difficulty of Paying Living Expenses: Not on file  Food Insecurity:   . Worried About Programme researcher, broadcasting/film/video in the Last Year: Not on file  . Ran Out of Food in the Last Year: Not on file  Transportation Needs:   . Lack of Transportation (Medical): Not on file  . Lack of Transportation (Non-Medical): Not on file  Physical Activity:   . Days of Exercise per Week: Not on file  . Minutes of Exercise per Session: Not on file  Stress:   . Feeling of Stress : Not on file  Social Connections:   .  Frequency of Communication with Friends and Family: Not on file  . Frequency of Social Gatherings with Friends and Family: Not on file  . Attends Religious Services: Not on file  . Active Member of Clubs or Organizations: Not on file  . Attends Banker Meetings: Not on file  . Marital Status: Not on file     Family History: The patient's family history includes Breast cancer in her sister; Dementia (age of onset: 24) in her father; Diabetes in her brother and sister; Diabetes (age of onset: 23) in her father; Diabetes (age of onset: 36) in her mother; Heart disease  in her brother; Hyperlipidemia in her brother, father, and sister; Hypertension in her brother and sister. ROS:   Please see the history of present illness.    All other systems reviewed and are negative.  EKGs/Labs/Other Studies Reviewed:    The following studies were reviewed today:  EKG:  EKG ordered today and personally reviewed.  The ekg ordered today demonstrates sinus rhythm  Recent Labs: 02/07/2019: B Natriuretic Peptide 44.6 02/08/2019: ALT 18; BUN 28; Creatinine, Ser 0.83; Hemoglobin 12.2; Magnesium 2.3; Platelets 319; Potassium 5.0; Sodium 136   Recent Lipid Panel    Component Value Date/Time   CHOL 217 (H) 12/25/2012 0814   TRIG 126 12/25/2012 0814   HDL 52 12/25/2012 0814   CHOLHDL 4.2 12/25/2012 0814   VLDL 25 12/25/2012 0814   LDLCALC 140 (H) 12/25/2012 0814    Physical Exam:    VS:  BP 132/70   Pulse 73   Ht 5\' 5"  (1.651 m)   Wt 244 lb (110.7 kg)   LMP 05/31/2011   SpO2 98%   BMI 40.60 kg/m     Wt Readings from Last 3 Encounters:  12/02/19 244 lb (110.7 kg)  09/11/19 241 lb 12.8 oz (109.7 kg)  03/29/19 242 lb (109.8 kg)     GEN:  Well nourished, well developed in no acute distress HEENT: Normal NECK: No JVD; No carotid bruits LYMPHATICS: No lymphadenopathy CARDIAC: RRR, no murmurs, rubs, gallops RESPIRATORY:  Clear to auscultation without rales, wheezing or rhonchi    ABDOMEN: Soft, non-tender, non-distended MUSCULOSKELETAL:  No edema; No deformity  SKIN: Warm and dry NEUROLOGIC:  Alert and oriented x 3 PSYCHIATRIC:  Normal affect   Signed, 05/29/19, MD  12/02/2019 9:40 AM    [facility]

## 2019-12-02 ENCOUNTER — Ambulatory Visit (INDEPENDENT_AMBULATORY_CARE_PROVIDER_SITE_OTHER): Payer: 59 | Admitting: Cardiology

## 2019-12-02 ENCOUNTER — Other Ambulatory Visit: Payer: Self-pay

## 2019-12-02 ENCOUNTER — Encounter: Payer: Self-pay | Admitting: Cardiology

## 2019-12-02 VITALS — BP 132/70 | HR 73 | Ht 65.0 in | Wt 244.0 lb

## 2019-12-02 DIAGNOSIS — I471 Supraventricular tachycardia: Secondary | ICD-10-CM

## 2019-12-02 DIAGNOSIS — E7800 Pure hypercholesterolemia, unspecified: Secondary | ICD-10-CM

## 2019-12-02 NOTE — Patient Instructions (Signed)

## 2020-09-09 NOTE — Progress Notes (Addendum)
PATIENT: [name] [facility] DOB: December 19, 1960  REASON FOR VISIT: follow up HISTORY FROM: patient  Chief Complaint  Patient presents with   Follow-up    Pt states for about 8 weekends in a row she has been having migraines. She can't pinpoint what is causing. Having to use the sumatriptan injections more frequently then normal. She noticed it is when she wakes up. Still using CPAP. DME [facility]     Dr Krista Blue: headaches Dr Rexene Alberts: sleep     HISTORY OF PRESENT ILLNESS: 09/14/20 ALL: [name] returns for follow up for OSA on CPAP and migraines. She continues venlafaxine 76m at bedtime and sumatriptan as needed. Oral usually helps but may use injections for intractable migraines. For the past 8 weeks, she feels that she has had a headache every weekend. She seems to have a headache when she wakes on Saturday. She lost her oral sumatriptan and has used injections that seem to help. She is not able to identify specific triggers.   She is doing well with CPAP therapy. She does note worsening leak, recently, and feels it is related to needing a new mask. She will continue to monitor at home.     09/11/2019 ALL:  Jodi JANTZis a 60y.o. female here today for follow up for migraines and OSA on CPAP. She is doing well. Migraines are well managed with sumatriptan for abortive therapy. She is also taking venlafaxine 758mat bedtime. She is using CPAP nightly. She continues to note a leak with her Dreamwear FFM. She has days where she feels really tired. She feels this is related to her sleep patterns. She is working on getting to bed around 9pm consistently. She wakes around 5.   Compliance report dated 08/11/2019 through 09/09/2019 reveals that she used CPAP 30 of the past 30 days for compliance of 100%.  She used CPAP greater than 4 hours 29 of the last 30 days for compliance of 97%.  Average usage was 6 hours and 42 minutes.  Residual AHI was 0.8 on 7 to 13 cm of water and an EPR of 1.  There  was a leak noted in the 95th percentile of 32.2.  HISTORY: (copied from my note on 03/14/2019)  Jodi THERRIENs a [age].o. female here today for follow up of OSA on CPAP and migraines. She is doing very well.  She reports near daily compliance with CPAP therapy.  She denies any difficulty with the machine with the exception of noting a leak.  She is using a DreamWear full facemask.  Over the past few weeks she has noted that the air is leaking around the mask.  She replaces her supplies regularly.  She has not reached out to the DME company.  She does feel that fatigue levels have significantly improved.  She is recently recovering from Covid pneumonia.  She was hospitalized for 5 days in January.  She reports that at baseline.   Migraines are well managed.  She continues Effexor for preventative care.  She has approximately 2-3 migraines per month.  She is using Imitrex as needed for abortive therapy.  She has both tablet and injection forms of Imitrex.  Both work very well for abortive therapy.  She does feel that Imitrex tablets make her have more joint stiffness for about 24 hours.  She does not feel that this is worrisome.  No other side effects.  No chest pain, trouble breathing or dizziness.   Compliance report  dated 02/12/2019 through 03/13/2019 reveals that she used CPAP 27 of the last 30 days for compliance of 90%.  She used CPAP greater than 4 hours 26 of 30 days for compliance of 87%.  Average usage was 6 hours and 48 minutes.  Residual AHI was 0.2 on 7 to 13 cm of water and an EPR of 1.  There was a significant leak noted in the 95th percentile of 29.3 L/min.   HISTORY: (copied from my note on 09/11/2018)   [name] is a 60 y.o. female here today for follow up OSA on CPAP. She continues to have concerns of excessive daytime sleepiness and fatigue. She is compliant with therapy. She feels that she was feeling better until about 2 weeks ago. She states that she has fallen asleep twice at  work. She has never done this before. She reports that once, she was dreaming. She is caring for a dog with seizures. She is up and down throughout the night. She continues Effexor 49m for migraine prevention. She is taking magnesium with Melatonin at night as well. She does not exercise. She was recently started on metformin [id number] daily. 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 She has gained about 30 pounds in the past year. She is working with [facility] to help with weight management. She is also caring for her elderly mother.    Compliance report dated 08/12/2018 through 09/10/2018 reveals that she is using CPAP nightly and greater than 4 hours each night for compliance of 100%.  Average usage is 6 hours and 56 minutes.  AHI was 0.5 on 7 to 13 cm of water and an EPR of 1.  There was no significant leak.     HISTORY: (copied from Dr AtGuadelupe Sabinote on 03/26/2018)   Interim history:    [name] a [age]ear old right-handed woman with an underlying medical history of migraine headaches, hyperlipidemia, factor V deficiency, depression, anxiety, history of DVT, history of varicose veins, and obesity, who presents for follow-up consultation of her obstructive sleep apnea, after a recent onset sleep testing and starting AutoPap therapy. The patient is unaccompanied today. I first met her on 01/22/2018 at the request of Dr. YaKrista Blueat which time she reported snoring and daytime somnolence as well as witnessed apneas. Her Epworth sleepiness score was 17 at the time.   Today, 03/26/2018: I reviewed her AutoPap compliance data from 02/24/2018 through 03/25/2018 which is a total of 30 days, during which time she used her AutoPap every night with percent used days greater than 4 hours at 100%, indicating superb compliance with an average usage of 6 hours and 46 minutes, residual AHI at goal at 0.5 per hour, 95th percentile pressure at 11.7 cm, leak on the high side  with the 95th percentile at 19.8 L/m on a pressure range of 7 cm to 13 cm. She reports that she is still sleepy. She even feels that she is more sleepy as she has started using the AutoPap. Her Epworth sleepiness score is 19 out of 24 today. Looking back at her medication history, she restarted Effexor long-acting in September 2019, she had stopped it in or around May 2018. When she saw Dr. YaKrista Bluen October 2019 the Effexor was increased to 75 mg daily. She takes it at night. She does admit that it could be correlated. She also has a dog that has to be up early, she tries to keep  a scheduled for this. She tries to keep a schedule on the weekends 2. She does report that her headaches are better all month of February since she has been consistent with her AutoPap. In fact, her headaches are much improved. Previously, she had been on Topamax which caused side effects and she also tried talks from migraines. She is motivated to continue with treatment. She has gained weight over time. She has a more sedentary job for the past year.     The patient's allergies, current medications, family history, past medical history, past social history, past surgical history and problem list were reviewed and updated as appropriate.    Previously:    01/22/2018: She reports snoring and excessive daytime somnolence. I reviewed your office note from 10/19/2017. She has had recurrent headaches. Her Epworth sleepiness score is 17 out of 24, fatigue score is 41 out of 63. She is divorced and lives alone, no children. She is a nonsmoker and drinks alcohol infrequently, caffeine not daily, 1-2 cups 3 or 4 times a week on average. She has a 40 minute commute and has become sleepy while driving. She works as an Lawyer. She tries to be in bed before 10 and arise time is currently around 5. She has a dog that needs to get out around that time. She has a TV in her bedroom, typically on a sleep timer, she has had some morning  headaches. She has nocturia about once per average night, is not aware of any family history of OSA. She gained weight starting in 2006. Her sister has noted her loud snoring but also pauses in her breathing and patient has woken herself up with a sense of gasping for air at times.    REVIEW OF SYSTEMS: Out of a complete 14 system review of symptoms, the patient complains only of the following symptoms, headaches, fatigue, joint and muscle aches and all other reviewed systems are negative.    ALLERGIES: Allergies  Allergen Reactions   Statins Other (See Comments)    Pt states she has severe joint and body aches.   Codeine Hives    rash    HOME MEDICATIONS: Outpatient Medications Prior to Visit  Medication Sig Dispense Refill   aspirin EC 325 MG tablet Take 325 mg by mouth daily.     Bioflavonoid Products (SUPER-C 1000) TABS Take 1 tablet by mouth daily.     BIOTIN PO Take 1 tablet by mouth daily.     Cholecalciferol (VITAMIN D3 PO) Take 5,000 Units by mouth daily.     furosemide (LASIX) 20 MG tablet Take 20 mg by mouth daily.     Garlic 4854 MG CAPS Take 1,000 mg by mouth daily.      ibuprofen (ADVIL,MOTRIN) 200 MG tablet Take 400 mg by mouth every 6 (six) hours as needed for mild pain.     KRILL OIL PO Take 750 mg by mouth daily.     Magnesium 400 MG TABS Take 800 mg by mouth daily.     metFORMIN (GLUCOPHAGE) 500 MG tablet metformin 500 mg tablet     metoprolol succinate (TOPROL-XL) 25 MG 24 hr tablet TAKE ONE TABLET BY MOUTH ONE TIME DAILY 90 tablet 3   Multiple Vitamins-Minerals (ZINC PO) Take 1 tablet by mouth daily.     rosuvastatin (CRESTOR) 10 MG tablet Take by mouth.     venlafaxine XR (EFFEXOR-XR) 75 MG 24 hr capsule Take 1 capsule (75 mg total) by mouth at bedtime. 90 capsule  4   SUMAtriptan (IMITREX STATDOSE SYSTEM) 6 MG/0.5ML SOAJ Inject 6 mg into the skin as needed. 5 mL 11   SUMAtriptan (IMITREX) 100 MG tablet Take 1 tab at onset of migraine.  May repeat in 2 hrs, if  needed.  Max dose: 2 tabs/day. This is a 90 day prescription. 27 tablet 4   No facility-administered medications prior to visit.    PAST MEDICAL HISTORY: Past Medical History:  Diagnosis Date   Allergy    Anxiety    Atypical chest pain 08/24/2011   Blood clot in vein    right leg   Chest tightness    Chronic migraine 10/19/2017   Chronic venous insufficiency 05/23/2013   Linus Mako, MD Continue all current conservative treatment. 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 Seeking approval for this treatment and will proceed as soon as is approval is obtained.    Depression    Diabetes ([place]) 07/17/2014   Factor 5 Leiden mutation, heterozygous ([place]) 07/17/2014   Factor V deficiency ([place])    History of adverse effect of venous thromboembolism (VTE) prophylaxis 02/04/2019   History of COVID-19 02/14/2019   Hypercholesterolemia 08/24/2011   Hyperlipidemia    Migraine    Migraine, chronic, without aura, intractable 09/13/2012   Botox approved diagnosis.    Morbid obesity with BMI of 40.0-44.9, adult ([place])    Neuralgia 07/30/2015   Paroxysmal SVT (supraventricular tachycardia) ([place]) 02/04/2019   Pneumonia due to COVID-19 virus 02/04/2019   Sleep apnea 10/19/2017   Type 2 diabetes mellitus without complication, without long-term current use of insulin ([place]) 09/08/2018   Varicose vein    Venous thrombosis 12/10/2013   Occurred after treatment for varicose veins, she is on xarelto    Vitamin D deficiency 07/17/2014    PAST SURGICAL HISTORY: Past Surgical History:  Procedure Laterality Date   TUBAL LIGATION  2006   VEIN LIGATION Bilateral     FAMILY HISTORY: Family History  Problem Relation Age of Onset   Diabetes Mother 22       type 2   Diabetes Father 77       type 2   Dementia Father 53   Hyperlipidemia Father    Diabetes Sister    Hyperlipidemia Sister    Hypertension Sister    Diabetes Brother    Heart  disease Brother    Hyperlipidemia Brother    Hypertension Brother    Breast cancer Sister     SOCIAL HISTORY: Social History   Socioeconomic History   Marital status: Divorced    Spouse name: Not on file   Number of children: 0   Years of education: 12   Highest education level: Not on file  Occupational History   Occupation: Higher education careers adviser: [employer]  Tobacco Use   Smoking status: Never   Smokeless tobacco: Never  Vaping Use   Vaping Use: Never used  Substance and Sexual Activity   Alcohol use: Yes    Alcohol/week: 1.0 standard drink    Types: 1 Glasses of wine per week    Comment: OCC   Drug use: No   Sexual activity: Not on file  Other Topics Concern   Not on file  Social History Narrative   Patient is divorced. Patient has a high school education and works for [employer].    Right handed.   Caffeine- two daily  Social Determinants of Health   Financial Resource Strain: Not on file  Food Insecurity: Not on file  Transportation Needs: Not on file  Physical Activity: Not on file  Stress: Not on file  Social Connections: Not on file  Intimate Partner Violence: Not on file      PHYSICAL EXAM  Vitals:   09/14/20 0851  BP: 119/85  Pulse: 67  Weight: 243 lb (110.2 kg)  Height: 5' 5"  (1.651 m)    Body mass index is 40.44 kg/m.  Generalized: Well developed, in no acute distress  Cardiology: normal rate and rhythm, no murmur noted Respiratory: clear to auscultation bilaterally  Neurological examination  Mentation: Alert oriented to time, place, history taking. Follows all commands speech and language fluent Cranial nerve II-XII: Pupils were equal round reactive to light. Extraocular movements were full, visual field were full  Motor: The motor testing reveals 5 over 5 strength of all 4 extremities. Good symmetric motor tone is noted throughout.  Gait and station: Gait is normal.   DIAGNOSTIC DATA (LABS,  IMAGING, TESTING) - I reviewed patient records, labs, notes, testing and imaging myself where available.  No flowsheet data found.   Lab Results  Component Value Date   WBC 6.9 02/08/2019   HGB 12.2 02/08/2019   HCT 38.0 02/08/2019   MCV 85.4 02/08/2019   PLT 319 02/08/2019      Component Value Date/Time   NA 136 02/08/2019 0315   NA 141 01/13/2015 0853   K 5.0 02/08/2019 0315   K 4.3 01/13/2015 0853   CL 106 02/08/2019 0315   CO2 20 (L) 02/08/2019 0315   CO2 25 01/13/2015 0853   GLUCOSE 273 (H) 02/08/2019 0315   GLUCOSE 130 01/13/2015 0853   BUN 28 (H) 02/08/2019 0315   BUN 14.3 01/13/2015 0853   CREATININE 0.83 02/08/2019 0315   CREATININE 0.9 01/13/2015 0853   CALCIUM 8.6 (L) 02/08/2019 0315   CALCIUM 9.5 01/13/2015 0853   PROT 5.8 (L) 02/08/2019 0315   PROT 7.4 01/13/2015 0853   ALBUMIN 2.6 (L) 02/08/2019 0315   ALBUMIN 3.7 01/13/2015 0853   AST 17 02/08/2019 0315   AST 12 01/13/2015 0853   ALT 18 02/08/2019 0315   ALT 14 01/13/2015 0853   ALKPHOS 59 02/08/2019 0315   ALKPHOS 102 01/13/2015 0853   BILITOT 0.5 02/08/2019 0315   BILITOT 0.48 01/13/2015 0853   GFRNONAA >60 02/08/2019 0315   GFRNONAA 83 12/25/2012 0814   GFRAA >60 02/08/2019 0315   GFRAA >89 12/25/2012 0814   Lab Results  Component Value Date   CHOL 217 (H) 12/25/2012   HDL 52 12/25/2012   LDLCALC 140 (H) 12/25/2012   TRIG 126 12/25/2012   CHOLHDL 4.2 12/25/2012   Lab Results  Component Value Date   HGBA1C 7.5 (H) 02/06/2019   Lab Results  Component Value Date   VITAMINB12 440 01/13/2015   Lab Results  Component Value Date   TSH 3.140 12/25/2012       ASSESSMENT AND PLAN 60 y.o. year old female  has a past medical history of Allergy, Anxiety, Atypical chest pain (08/24/2011), Blood clot in vein, Chest tightness, Chronic migraine (10/19/2017), Chronic venous insufficiency (05/23/2013), Depression, Diabetes ([place]) (07/17/2014), Factor 5 Leiden mutation, heterozygous ([place]) (07/17/2014),  Factor V deficiency ([place]), History of adverse effect of venous thromboembolism (VTE) prophylaxis (02/04/2019), History of COVID-19 (02/14/2019), Hypercholesterolemia (08/24/2011), Hyperlipidemia, Migraine, Migraine, chronic, without aura, intractable (09/13/2012), Morbid obesity with BMI of 40.0-44.9, adult ([place]), Neuralgia (07/30/2015), Paroxysmal  SVT (supraventricular tachycardia) ([place]) (02/04/2019), Pneumonia due to COVID-19 virus (02/04/2019), Sleep apnea (10/19/2017), Type 2 diabetes mellitus without complication, without long-term current use of insulin ([place]) (09/08/2018), Varicose vein, Venous thrombosis (12/10/2013), and Vitamin D deficiency (07/17/2014). here with     ICD-10-CM   1. OSA on CPAP  G47.33 For home use only DME continuous positive airway pressure (CPAP)   Z99.89     2. Chronic migraine without aura without status migrainosus, not intractable  G43.709     3. Intractable chronic migraine without aura and without status migrainosus  G43.719 SUMAtriptan (IMITREX) 100 MG tablet    SUMAtriptan ([place]) 6 MG/0.5ML SOAJ       Abri is doing well on CPAP therapy.  Migraines are usually well managed. She has noticed recurring headaches on Saturday and Sunday for the past 8 weekend. I will have her take Nurtec 3m every other day for 12 days, then daily as needed. She was given 6 sample tablets and [facility] May consider CGRP injection if migraines continue to worsen. She will continue venlafaxine 75 mg every day and sumatriptan as needed. Compliance report reveals excellent compliance.  She was encouraged to continue using CPAP nightly and for greater than 4 hours each night. She will continue to monitor for leak at home. I have encouraged her to focus on healthy lifestyle habits.  Adequate hydration, well-balanced diet and sleep hygiene reviewed.  She will continue close follow-up with her primary care provider.  She will follow-up with [facility] 6-12 months. She verbalizes  understanding and agreement with this plan.   Orders Placed This Encounter  Procedures   For home use only DME continuous positive airway pressure (CPAP)    Supplies    Order Specific Question:   Length of Need    Answer:   Lifetime    Order Specific Question:   Patient has OSA or probable OSA    Answer:   Yes    Order Specific Question:   Is the patient currently using CPAP in the home    Answer:   Yes    Order Specific Question:   Settings    Answer:   Other see comments    Order Specific Question:   CPAP supplies needed    Answer:   Mask, headgear, cushions, filters, heated tubing and water chamber      Meds ordered this encounter  Medications   Rimegepant Sulfate (NURTEC) 75 MG TBDP    Sig: Take 75 mg by mouth daily as needed (take for abortive therapy of migraine, no more than 1 tablet in 24 hours or 10 per month).    Dispense:  8 tablet    Refill:  11    Order Specific Question:   Supervising Provider    Answer:   AMelvenia Beam[[[id number]]  SUMAtriptan (IMITREX) 100 MG tablet    Sig: Take 1 tab at onset of migraine.  May repeat in 2 hrs, if needed.  Max dose: 2 tabs/day. This is a 90 day prescription.    Dispense:  27 tablet    Refill:  4    #9/30 days.    Order Specific Question:   Supervising Provider    Answer:   AMelvenia Beam[[[id number]]  SUMAtriptan (IMITREX STATDOSE SYSTEM) 6 MG/0.5ML SOAJ    Sig: Inject 6 mg into the skin as needed.    Dispense:  5 mL    Refill:  11    Order  Specific Question:   Supervising Provider    Answer:   Melvenia Beam [[id number]]       EKB TCYEL, FNP-C 09/14/2020, 9:54 AM [facility] [address] [place], [place] [address] [phone]  I reviewed the above note and documentation by the Nurse Practitioner and agree with the history, exam, assessment and plan as outlined above. I was available for consultation. Star Age, MD, PhD [facility])

## 2020-09-09 NOTE — Patient Instructions (Addendum)
Below is our plan:  We will continue venlafaxine 75mg  at bedtime and sumatriptan as needed for abortive therapy. We will try Nurtec for abortive therapy. Take 1 tablet every day for the next 5 days then only as needed. Try to avoid regular use of abortive therapies.   Please make sure you are staying well hydrated. I recommend 50-60 ounces daily. Well balanced diet and regular exercise encouraged. Consistent sleep schedule with 6-8 hours recommended.   Please continue follow up with care team as directed.   Follow up with me in 6-12 months   You may receive a survey regarding today's visit. I encourage you to leave honest feed back as I do use this information to improve patient care. Thank you for seeing me today!    Please continue using your CPAP regularly. While your insurance requires that you use CPAP at least 4 hours each night on 70% of the nights, I recommend, that you not skip any nights and use it throughout the night if you can. Getting used to CPAP and staying with the treatment long term does take time and patience and discipline. Untreated obstructive sleep apnea when it is moderate to severe can have an adverse impact on cardiovascular health and raise her risk for heart disease, arrhythmias, hypertension, congestive heart failure, stroke and diabetes. Untreated obstructive sleep apnea causes sleep disruption, nonrestorative sleep, and sleep deprivation. This can have an impact on your day to day functioning and cause daytime sleepiness and impairment of cognitive function, memory loss, mood disturbance, and problems focussing. Using CPAP regularly can improve these symptoms.

## 2020-09-14 ENCOUNTER — Ambulatory Visit: Payer: 59 | Admitting: Family Medicine

## 2020-09-14 ENCOUNTER — Encounter: Payer: Self-pay | Admitting: Family Medicine

## 2020-09-14 VITALS — BP 119/85 | HR 67 | Ht 65.0 in | Wt 243.0 lb

## 2020-09-14 DIAGNOSIS — G4733 Obstructive sleep apnea (adult) (pediatric): Secondary | ICD-10-CM

## 2020-09-14 DIAGNOSIS — Z9989 Dependence on other enabling machines and devices: Secondary | ICD-10-CM

## 2020-09-14 DIAGNOSIS — G43719 Chronic migraine without aura, intractable, without status migrainosus: Secondary | ICD-10-CM

## 2020-09-14 DIAGNOSIS — G43709 Chronic migraine without aura, not intractable, without status migrainosus: Secondary | ICD-10-CM | POA: Diagnosis not present

## 2020-09-14 MED ORDER — SUMATRIPTAN SUCCINATE 100 MG PO TABS: ORAL_TABLET | ORAL | 4 refills | Status: DC

## 2020-09-14 MED ORDER — SUMATRIPTAN SUCCINATE 6 MG/0.5ML ~~LOC~~ SOAJ: 6.0000 mg | SUBCUTANEOUS | 11 refills | Status: DC | PRN

## 2020-09-14 MED ORDER — NURTEC 75 MG PO TBDP: 75.0000 mg | ORAL_TABLET | Freq: Every day | ORAL | 11 refills | Status: DC | PRN

## 2020-09-15 ENCOUNTER — Telehealth: Payer: Self-pay

## 2020-09-15 NOTE — Telephone Encounter (Signed)
PA request for Nurtec 75mg  submitted on CMM, Key: [id number].  Awaiting determination from [facility].

## 2020-09-15 NOTE — Telephone Encounter (Signed)
Approved  Request Reference Number: [id number]. NURTEC TAB 75MG  ODT is approved through 09/15/2021.

## 2020-09-30 NOTE — Progress Notes (Signed)
Chart reviewed, agree above plan ?

## 2020-10-13 ENCOUNTER — Telehealth: Payer: Self-pay | Admitting: Family Medicine

## 2020-10-13 DIAGNOSIS — G43719 Chronic migraine without aura, intractable, without status migrainosus: Secondary | ICD-10-CM

## 2020-10-13 MED ORDER — VENLAFAXINE HCL ER 75 MG PO CP24: 75.0000 mg | ORAL_CAPSULE | Freq: Every day | ORAL | 3 refills | Status: DC

## 2020-10-13 NOTE — Telephone Encounter (Signed)
Pt called requesting refill for venlafaxine XR (EFFEXOR-XR) 75 MG 24 hr capsule. Pharmacy [facility] # [id number]. Pt is there now, and lives in [place].

## 2020-10-13 NOTE — Telephone Encounter (Signed)
Refill has been sent for the patient 

## 2020-10-17 IMAGING — MR MR FOOT*R* W/O CM
4 of 6 series · 13 of 40 positions shown · non-contrast
Comparison: Ankle radiographs 04/29/2019.

CLINICAL DATA: Chronic posteromedial foot pain and swelling.
Posterior tibial tendinitis suspected.

EXAM:
MRI OF THE RIGHT FOOT WITHOUT CONTRAST
TECHNIQUE: Multiplanar, multisequence MR imaging of the right hindfoot was
performed. No intravenous contrast was administered.

[Series 3: PD fat-sat · axial · right · 3.0mm · 0.25mm/px · z∈[-86,+10]mm · 4 of 30 slices shown]
[im 1/30]
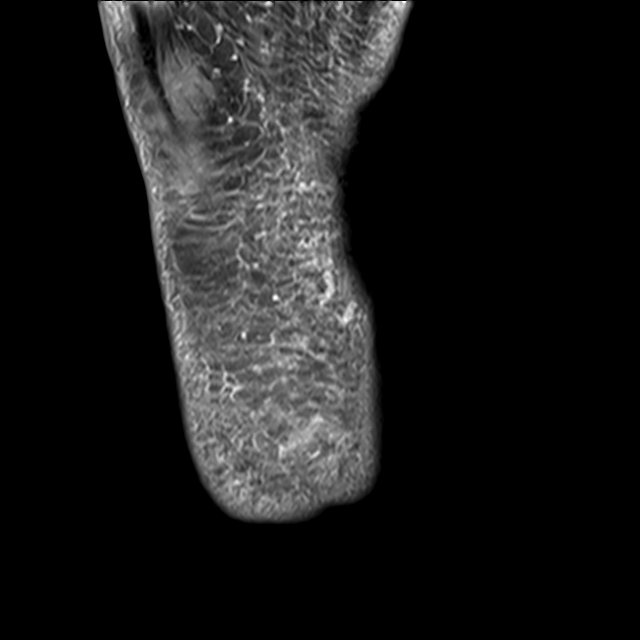
[im 5/30]
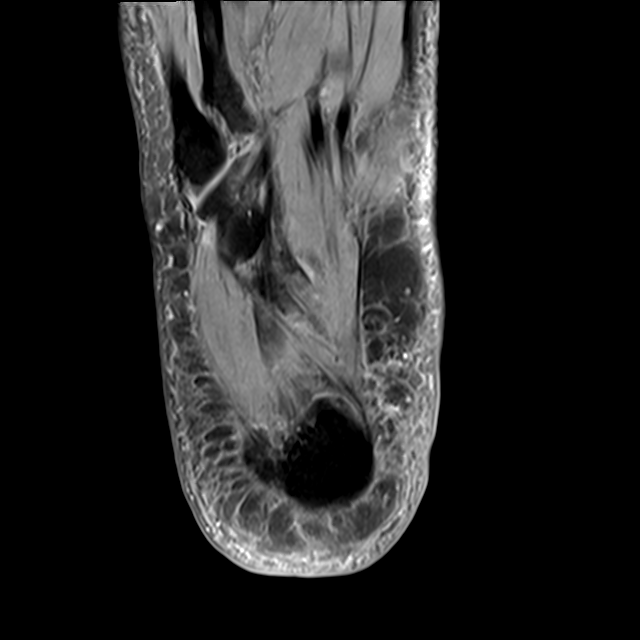
[im 17/30]
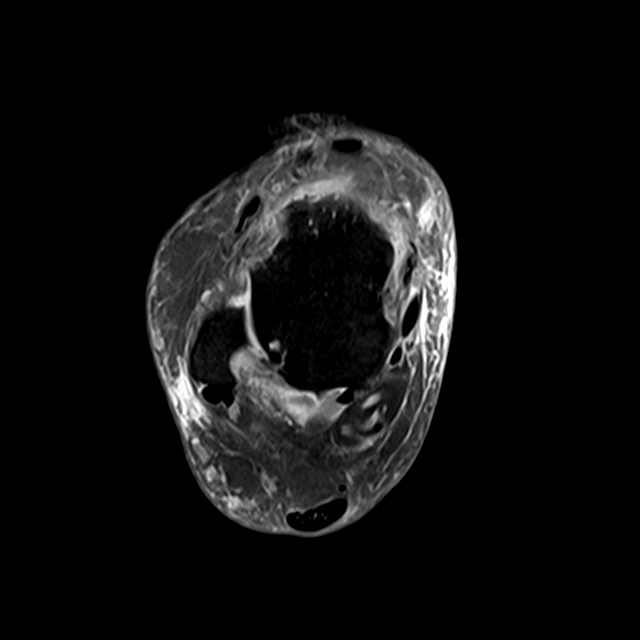
[im 25/30]
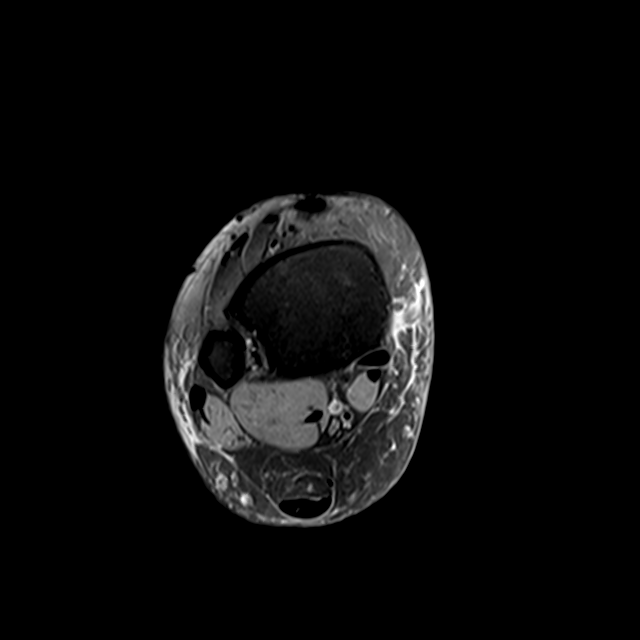

[Series 4: T2 fat-sat · axial · right · 3.0mm · 0.25mm/px · z∈[-70,+10]mm · 3 of 30 slices shown (1 of 2)]
[im 5/30]
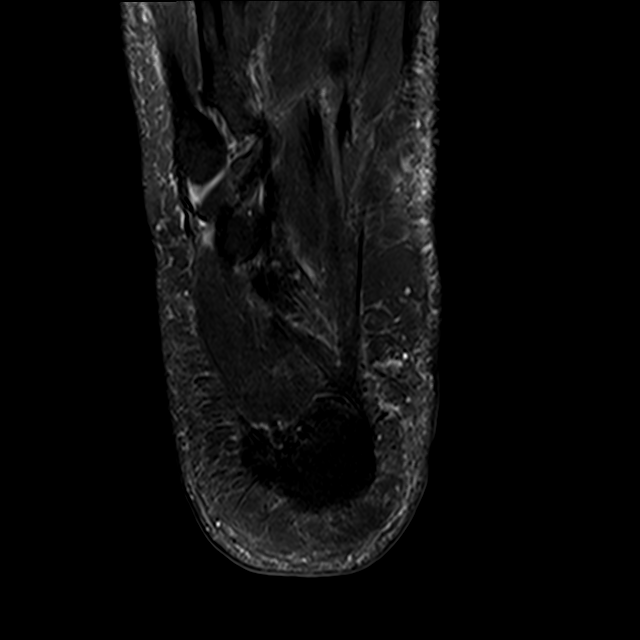
[im 15/30]
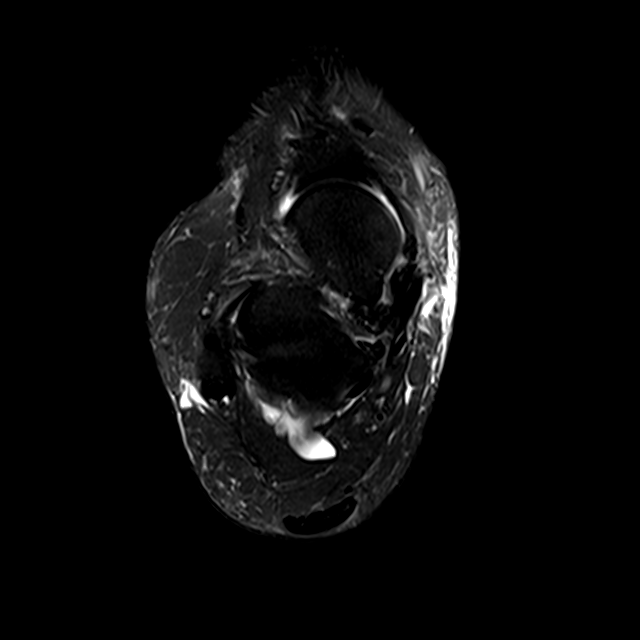
[im 25/30]
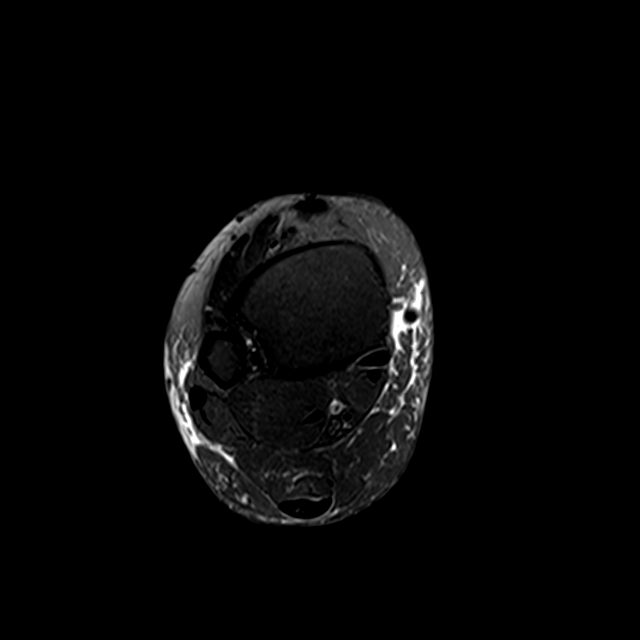

[Series 5: T1 · sagittal · right · 4.0mm · 0.27mm/px · 3 of 24 slices shown]
[im 5/24]
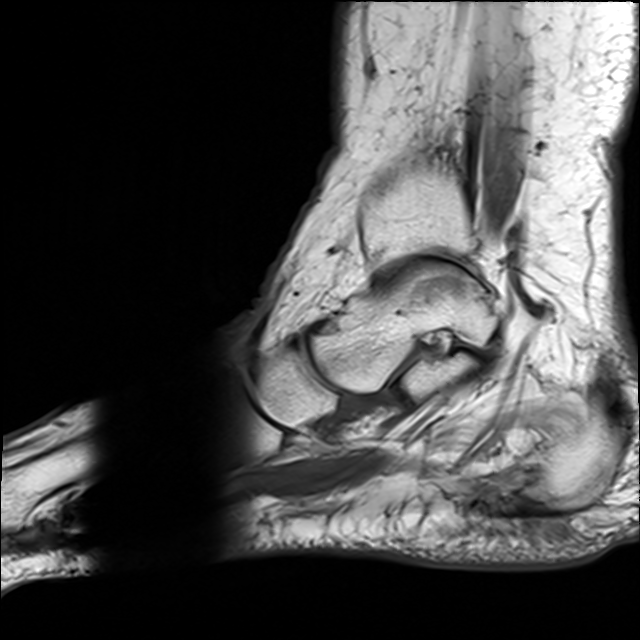
[im 14/24]
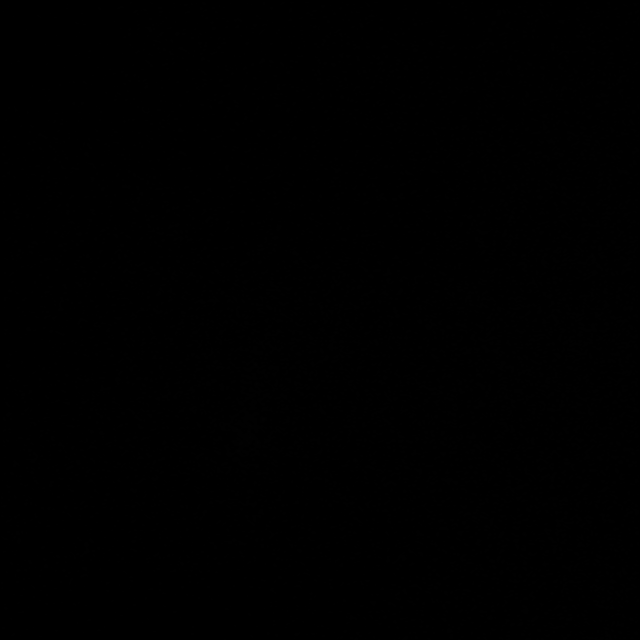
[im 24/24]
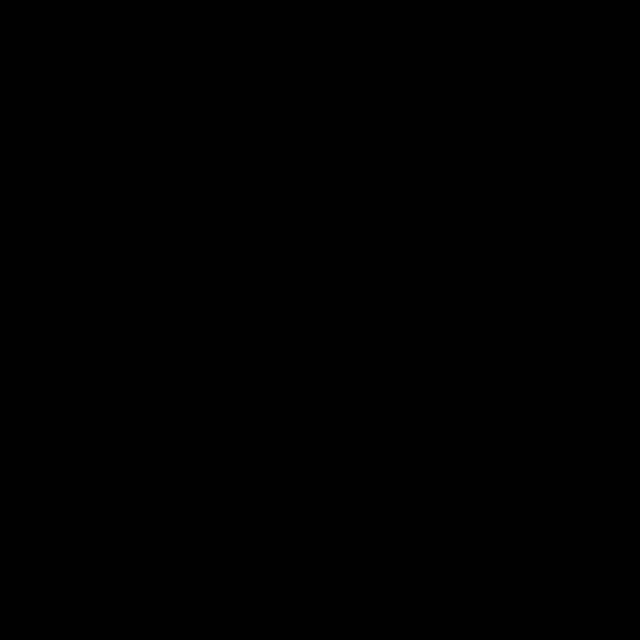

[Series 6: T2 fat-sat · coronal · right · 3.0mm · 0.25mm/px · 3 of 33 slices shown (2 of 2)]
[im 5/33]
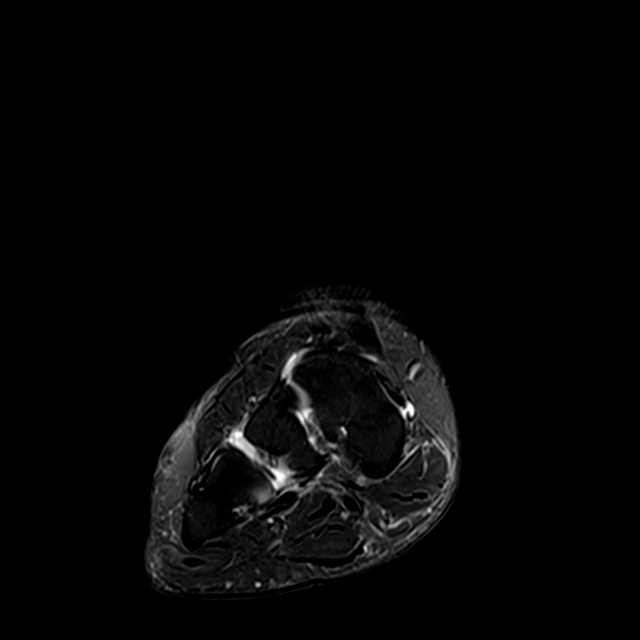
[im 19/33]
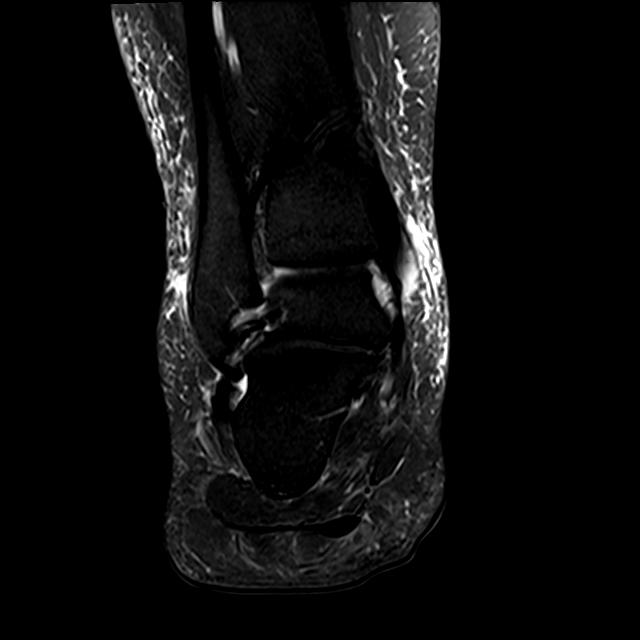
[im 28/33]
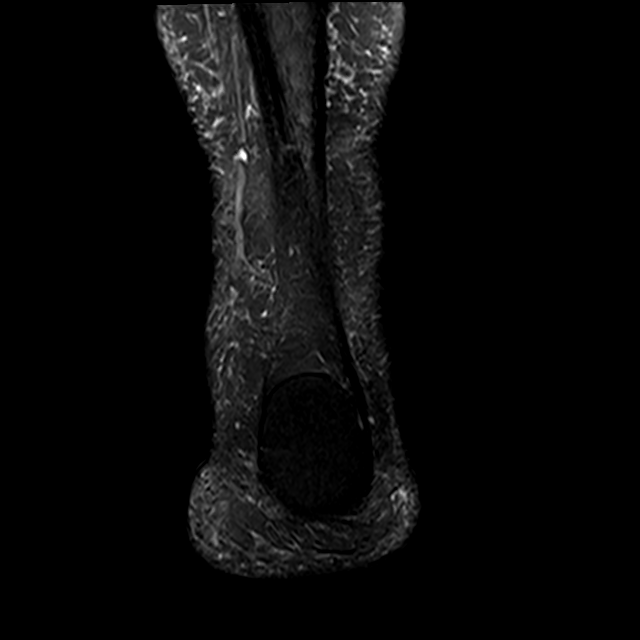

[13 of 40 positions shown; findings below may reference images not displayed]

FINDINGS: TENDONS

Peroneal: Intact and normally positioned.

Posteromedial: The posterior tibialis tendon is thickened with
longitudinal split tearing proximal to a type 1 accessory navicular.
No full-thickness tendon tear or tendon retraction. The additional
medial flexor tendons are intact. A small amount of fluid is present
within the posterior tibialis and the flexor digitorum longus tendon
sheaths.

Anterior: Intact and normally positioned.

Achilles: Intact.

Plantar Fascia: Intact.

LIGAMENTS

Lateral: The anterior and posterior talofibular and calcaneofibular
ligaments are intact.

Medial: The deltoid and visualized portions of the spring ligament
appear intact.

CARTILAGE AND BONES

Ankle Joint: No significant ankle joint effusion. The talar dome and
tibial plafond are intact.

Subtalar Joints/Sinus Tarsi: Unremarkable.

Bones: No significant extra-articular osseous findings. As above,
type 1 accessory navicular with surrounding soft tissue edema.

Other: Subcutaneous edema both medially and laterally. Small
calcaneocuboid joint effusion.
IMPRESSION: 1. Posterior tibialis tendinosis with longitudinal split tearing
(type 1) proximal to a type 1 accessory navicular. No full-thickness
tendon tear or tendon retraction.
2. The additional ankle tendons and ligaments are intact.
3. Mild subcutaneous edema surrounding the ankle.

## 2020-11-02 ENCOUNTER — Other Ambulatory Visit: Payer: Self-pay | Admitting: Cardiology

## 2020-11-30 ENCOUNTER — Other Ambulatory Visit: Payer: Self-pay | Admitting: Cardiology

## 2020-11-30 NOTE — Telephone Encounter (Signed)
Refill sent.

## 2021-01-19 ENCOUNTER — Ambulatory Visit: Payer: 59 | Admitting: Cardiology

## 2021-04-01 ENCOUNTER — Ambulatory Visit: Payer: 59 | Admitting: Cardiology

## 2021-04-08 ENCOUNTER — Telehealth: Payer: Self-pay | Admitting: *Deleted

## 2021-04-08 DIAGNOSIS — G43719 Chronic migraine without aura, intractable, without status migrainosus: Secondary | ICD-10-CM

## 2021-04-08 NOTE — Telephone Encounter (Signed)
Submitted PA Nurtec on CMM. KeyLavell Islam - PA Case ID: [id number] - Rx #[id number]. Waiting on determination from [employer].  ?

## 2021-04-12 NOTE — Telephone Encounter (Signed)
Received denial letter from [facility]. Pt must try/fail two of the formulary drugs: almotriptan, eletriptan, naratriptan, rizatriptan/rizatriptan ODT, sumatriptan, or zolmitriptan/zolmitriptan ODT.  ? ?I reviewed pt chart. Looks like she has tried/failed: sumatriptan and frovatriptan (this is not on formulary list). ?

## 2021-04-12 NOTE — Telephone Encounter (Signed)
LVM for pt to call office to discuss  

## 2021-04-13 MED ORDER — SUMATRIPTAN SUCCINATE 6 MG/0.5ML ~~LOC~~ SOAJ: 6.0000 mg | SUBCUTANEOUS | 11 refills | Status: DC | PRN

## 2021-04-13 MED ORDER — SUMATRIPTAN SUCCINATE 100 MG PO TABS: ORAL_TABLET | ORAL | 4 refills | Status: DC

## 2021-04-13 NOTE — Addendum Note (Signed)
Addended byShawnie Dapper L on: 04/13/2021 03:11 PM ? ? Modules accepted: Orders ? ?

## 2021-04-13 NOTE — Addendum Note (Signed)
Addended by: Arther Abbott on: 04/13/2021 03:01 PM ? ? Modules accepted: Orders ? ?

## 2021-04-13 NOTE — Telephone Encounter (Signed)
Took call from phone staff and spoke w/ pt. She has not been taking nurtec. She lost her job 09/2020. Changed insurances. Has only been taking sumatriptan tablet or injection (not together). This works well for her.  ?Out of frovatriptan but this has worked well for her in the past. She has been doing well besides a severe migraine last week that she has to take 3 tabs of sumatriptan plus injection for.  ?Next follow up 09/15/21.  ?

## 2021-05-19 NOTE — Progress Notes (Deleted)
Cardiology Office Note:    Date:  05/19/2021   ID:  [name], DOB Aug 07, 1960, MRN [id number]  PCP:  Elizabeth Palau, FNP  Cardiologist:  Norman Herrlich, MD    Referring MD: Elizabeth Palau, FNP    ASSESSMENT:    No diagnosis found. PLAN:    In order of problems listed above:  ***   Next appointment: ***   Medication Adjustments/Labs and Tests Ordered: Current medicines are reviewed at length with the patient today.  Concerns regarding medicines are outlined above.  No orders of the defined types were placed in this encounter.  No orders of the defined types were placed in this encounter.   No chief complaint on file.   History of Present Illness:    [name] is a 61 y.o. female with a hx of paroxysmal SVT and hyperlipidemia last seen 12/02/2019. Compliance with diet, lifestyle and medications: *** Past Medical History:  Diagnosis Date   Allergy    Anxiety    Atypical chest pain 08/24/2011   Blood clot in vein    right leg   Chest tightness    Chronic migraine 10/19/2017   Chronic venous insufficiency 05/23/2013   [name], MD Continue all current conservative treatment. 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 Seeking approval for this treatment and will proceed as soon as is approval is obtained.    Depression    Diabetes (HCC) 07/17/2014   Factor 5 Leiden mutation, heterozygous (HCC) 07/17/2014   Factor V deficiency (HCC)    History of adverse effect of venous thromboembolism (VTE) prophylaxis 02/04/2019   History of COVID-19 02/14/2019   Hypercholesterolemia 08/24/2011   Hyperlipidemia    Migraine    Migraine, chronic, without aura, intractable 09/13/2012   Botox approved diagnosis.    Morbid obesity with BMI of 40.0-44.9, adult (HCC)    Neuralgia 07/30/2015   Paroxysmal SVT (supraventricular tachycardia) (HCC) 02/04/2019   Pneumonia due to COVID-19 virus 02/04/2019   Sleep  apnea 10/19/2017   Type 2 diabetes mellitus without complication, without long-term current use of insulin (HCC) 09/08/2018   Varicose vein    Venous thrombosis 12/10/2013   Occurred after treatment for varicose veins, she is on xarelto    Vitamin D deficiency 07/17/2014    Past Surgical History:  Procedure Laterality Date   TUBAL LIGATION  2006   VEIN LIGATION Bilateral     Current Medications: No outpatient medications have been marked as taking for the 05/20/21 encounter (Appointment) with Baldo Daub, MD.     Allergies:   Statins and Codeine   Social History   Socioeconomic History   Marital status: Divorced    Spouse name: Not on file   Number of children: 0   Years of education: 12   Highest education level: Not on file  Occupational History   Occupation: Engineering geologist: [employer]  Tobacco Use   Smoking status: Never   Smokeless tobacco: Never  Vaping Use   Vaping Use: Never used  Substance and Sexual Activity   Alcohol use: Yes    Alcohol/week: 1.0 standard drink    Types: 1 Glasses of wine per week    Comment: OCC   Drug use: No   Sexual activity: Not on file  Other Topics Concern   Not on file  Social History Narrative   Patient is divorced. Patient has a high school education and works for  [employer].    Right handed.   Caffeine- two daily            Social Determinants of Health   Financial Resource Strain: Not on file  Food Insecurity: Not on file  Transportation Needs: Not on file  Physical Activity: Not on file  Stress: Not on file  Social Connections: Not on file     Family History: The patient's ***family history includes Breast cancer in her sister; Dementia (age of onset: 83) in her father; Diabetes in her brother and sister; Diabetes (age of onset: 69) in her father; Diabetes (age of onset: 24) in her mother; Heart disease in her brother; Hyperlipidemia in her brother, father, and sister; Hypertension in  her brother and sister. ROS:   Please see the history of present illness.    All other systems reviewed and are negative.  EKGs/Labs/Other Studies Reviewed:    The following studies were reviewed today:  EKG:  EKG ordered today and personally reviewed.  The ekg ordered today demonstrates ***  Recent Labs: No results found for requested labs within last 8760 hours.  Recent Lipid Panel    Component Value Date/Time   CHOL 217 (H) 12/25/2012 0814   TRIG 126 12/25/2012 0814   HDL 52 12/25/2012 0814   CHOLHDL 4.2 12/25/2012 0814   VLDL 25 12/25/2012 0814   LDLCALC 140 (H) 12/25/2012 0814    Physical Exam:    VS:  LMP 05/31/2011     Wt Readings from Last 3 Encounters:  09/14/20 243 lb (110.2 kg)  12/02/19 244 lb (110.7 kg)  09/11/19 241 lb 12.8 oz (109.7 kg)     GEN: *** Well nourished, well developed in no acute distress HEENT: Normal NECK: No JVD; No carotid bruits LYMPHATICS: No lymphadenopathy CARDIAC: ***RRR, no murmurs, rubs, gallops RESPIRATORY:  Clear to auscultation without rales, wheezing or rhonchi  ABDOMEN: Soft, non-tender, non-distended MUSCULOSKELETAL:  No edema; No deformity  SKIN: Warm and dry NEUROLOGIC:  Alert and oriented x 3 PSYCHIATRIC:  Normal affect    Signed, Norman Herrlich, MD  05/19/2021 12:45 PM    [facility]

## 2021-05-20 ENCOUNTER — Ambulatory Visit: Payer: 59 | Admitting: Cardiology

## 2021-05-20 ENCOUNTER — Telehealth: Payer: Self-pay

## 2021-05-20 ENCOUNTER — Other Ambulatory Visit: Payer: Self-pay

## 2021-05-20 MED ORDER — METOPROLOL SUCCINATE ER 25 MG PO TB24: 25.0000 mg | ORAL_TABLET | Freq: Every day | ORAL | 0 refills | Status: DC

## 2021-05-20 NOTE — Telephone Encounter (Signed)
Refill for Metoprolol sent to [facility] pharmacy in [place], [place]. ?

## 2021-05-20 NOTE — Telephone Encounter (Signed)
Patient is needing a refill on her Metoprolol 25 mg, sent into [facility] in [place]. Pharm # [phone] ? ?CB # [phone] ?

## 2021-06-08 ENCOUNTER — Other Ambulatory Visit: Payer: Self-pay

## 2021-06-14 NOTE — Progress Notes (Unsigned)
Cardiology Office Note:    Date:  06/15/2021   ID:  [name], DOB October 25, 1960, MRN [id number]  PCP:  Elizabeth Palau, FNP  Cardiologist:  Norman Herrlich, MD    Referring MD: Elizabeth Palau, FNP    ASSESSMENT:    1. Paroxysmal SVT (supraventricular tachycardia) (HCC)   2. Pure hypercholesterolemia    PLAN:    In order of problems listed above:  She continues to do well with SVT no recurrence continue her beta-blocker. I told her in her age group being diabetic she will likely need to restart lipid-lowering treatment but she wants to give diet weight loss and activity a chance and I think that is reasonable.   Next appointment: 18 months   Medication Adjustments/Labs and Tests Ordered: Current medicines are reviewed at length with the patient today.  Concerns regarding medicines are outlined above.  Orders Placed This Encounter  Procedures   EKG 12-Lead   Meds ordered this encounter  Medications   metoprolol succinate (TOPROL-XL) 25 MG 24 hr tablet    Sig: Take 1 tablet (25 mg total) by mouth daily.    Dispense:  90 tablet    Refill:  3    Pt must make an appointment for further refills.    Chief Complaint  Patient presents with   Follow-up    SVT     History of Present Illness:    [name] is a 61 y.o. female with a hx of SVT last seen 12/02/2019.  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 She has had no breakthrough episodes of rapid heart rhythm.  No complaints of chest pain shortness of breath edema or palpitation. Compliance with diet, lifestyle and medications: Yes   Past Medical History:  Diagnosis Date   Allergy    Anxiety    Atypical chest pain 08/24/2011   Blood clot in vein    right leg   Chest tightness    Chronic migraine 10/19/2017   Chronic venous insufficiency  05/23/2013   [name], MD Continue all current conservative treatment. 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 Seeking approval for this treatment and will proceed as soon as is approval is obtained.    Depression    Diabetes (HCC) 07/17/2014   Factor 5 Leiden mutation, heterozygous (HCC) 07/17/2014   Factor V deficiency (HCC)    Family history of premature coronary artery disease 08/24/2011   History of adverse effect of venous thromboembolism (VTE) prophylaxis 02/04/2019   History of COVID-19 02/14/2019   Hypercholesterolemia 08/24/2011   Hyperlipidemia    Migraine    Migraine, chronic, without aura, intractable 09/13/2012   Botox approved diagnosis.    Morbid obesity (HCC) 09/07/2018   Morbid obesity with BMI of 40.0-44.9, adult (HCC)    Neuralgia 07/30/2015   Paroxysmal SVT (supraventricular tachycardia) (HCC) 02/04/2019   Pneumonia due to COVID-19 virus 02/04/2019   Sleep apnea 10/19/2017   Trochanteric bursitis of left hip 11/12/2019   Trochanteric bursitis of right hip 11/12/2019   Type 2 diabetes mellitus without complication, without long-term current use of insulin (HCC) 09/08/2018   Venous thrombosis 12/10/2013   Occurred after treatment for varicose veins, she is on xarelto    Vitamin D deficiency 07/17/2014    Past Surgical History:  Procedure Laterality  Date   TUBAL LIGATION  2006   VEIN LIGATION Bilateral     Current Medications: Current Meds  Medication Sig   aspirin EC 325 MG tablet Take 162.5 mg by mouth daily.   Cholecalciferol (VITAMIN D3 PO) Take 5,000 Units by mouth daily.   furosemide (LASIX) 20 MG tablet Take 20 mg by mouth daily.   ibuprofen (ADVIL,MOTRIN) 200 MG tablet Take 400 mg by mouth every 6 (six) hours as needed for mild pain.   Magnesium 400 MG TABS Take 800 mg by mouth daily.   OZEMPIC, 0.25 OR 0.5 MG/DOSE, 2 MG/3ML SOPN Inject into the skin.   SUMAtriptan  (IMITREX) 100 MG tablet Take 1 tab at onset of migraine.  May repeat in 2 hrs, if needed.  Max dose: 2 tabs/day. This is a 90 day prescription.   SUMAtriptan (IMITREX) 6 MG/0.5ML SOLN injection Inject 6 mg into the skin daily as needed for migraine.   venlafaxine XR (EFFEXOR-XR) 75 MG 24 hr capsule Take 1 capsule (75 mg total) by mouth at bedtime.   [DISCONTINUED] metoprolol succinate (TOPROL-XL) 25 MG 24 hr tablet Take 1 tablet (25 mg total) by mouth daily.     Allergies:   Statins and Codeine   Social History   Socioeconomic History   Marital status: Divorced    Spouse name: Not on file   Number of children: 0   Years of education: 12   Highest education level: Not on file  Occupational History   Occupation: Engineering geologist: [employer]  Tobacco Use   Smoking status: Never   Smokeless tobacco: Never  Vaping Use   Vaping Use: Never used  Substance and Sexual Activity   Alcohol use: Yes    Alcohol/week: 1.0 standard drink    Types: 1 Glasses of wine per week    Comment: OCC   Drug use: No   Sexual activity: Not on file  Other Topics Concern   Not on file  Social History Narrative   Patient is divorced. Patient has a high school education and works for [employer].    Right handed.   Caffeine- two daily            Social Determinants of Health   Financial Resource Strain: Not on file  Food Insecurity: Not on file  Transportation Needs: Not on file  Physical Activity: Not on file  Stress: Not on file  Social Connections: Not on file     Family History: The patient's family history includes Breast cancer in her sister; Dementia (age of onset: 77) in her father; Diabetes in her brother and sister; Diabetes (age of onset: 52) in her father; Diabetes (age of onset: 13) in her mother; Heart disease in her brother; Hyperlipidemia in her brother, father, and sister; Hypertension in her brother and sister. ROS:   Please see the history of present  illness.    All other systems reviewed and are negative.  EKGs/Labs/Other Studies Reviewed:    The following studies were reviewed today:  EKG:  EKG ordered today and personally reviewed.  The ekg ordered today demonstrates sinus rhythm and normal  Recent Labs: 05/25/2020: Cholesterol 151 LDL 80 triglycerides 153 HDL 44 CMP creatinine 0.74 GFR 92 cc potassium 4.7 normal liver function test her albumin creatinine ratio was normal A1c 7.1%   Physical Exam:    VS:  BP 108/70   Pulse 81   Ht 5\' 5"  (1.651 m)   Wt 235  lb 0.6 oz (106.6 kg)   LMP 05/31/2011   SpO2 97%   BMI 39.11 kg/m     Wt Readings from Last 3 Encounters:  06/15/21 235 lb 0.6 oz (106.6 kg)  09/14/20 243 lb (110.2 kg)  12/02/19 244 lb (110.7 kg)     GEN:  Well nourished, well developed in no acute distress HEENT: Normal NECK: No JVD; No carotid bruits LYMPHATICS: No lymphadenopathy CARDIAC: RRR, no murmurs, rubs, gallops RESPIRATORY:  Clear to auscultation without rales, wheezing or rhonchi  ABDOMEN: Soft, non-tender, non-distended MUSCULOSKELETAL:  No edema; No deformity  SKIN: Warm and dry NEUROLOGIC:  Alert and oriented x 3 PSYCHIATRIC:  Normal affect    Signed, Norman HerrlichBrian Laurabelle Gorczyca, MD  06/15/2021 11:59 AM    [facility]

## 2021-06-15 ENCOUNTER — Encounter: Payer: Self-pay | Admitting: Cardiology

## 2021-06-15 ENCOUNTER — Ambulatory Visit (INDEPENDENT_AMBULATORY_CARE_PROVIDER_SITE_OTHER): Payer: BC Managed Care – PPO | Admitting: Cardiology

## 2021-06-15 VITALS — BP 108/70 | HR 81 | Ht 65.0 in | Wt 235.0 lb

## 2021-06-15 DIAGNOSIS — I471 Supraventricular tachycardia: Secondary | ICD-10-CM

## 2021-06-15 DIAGNOSIS — E7800 Pure hypercholesterolemia, unspecified: Secondary | ICD-10-CM

## 2021-06-15 MED ORDER — METOPROLOL SUCCINATE ER 25 MG PO TB24: 25.0000 mg | ORAL_TABLET | Freq: Every day | ORAL | 3 refills | Status: DC

## 2021-06-15 NOTE — Patient Instructions (Signed)

## 2021-09-14 NOTE — Patient Instructions (Incomplete)

## 2021-09-14 NOTE — Progress Notes (Unsigned)
PATIENT: [name] [facility] DOB: Apr 17, 1960  REASON FOR VISIT: follow up HISTORY FROM: patient  No chief complaint on file.   Dr Krista Blue: headaches Dr Rexene Alberts: sleep    HISTORY OF PRESENT ILLNESS:  09/14/21 [name]: [name] returns for follow up for OSA on CPAP and migraines.   She continues venlafaxine 57m QHS and sumatriptan as needed. Nurtec worked well but not covered when she lost her insurance.   09/14/2020 [name]: [name] for follow up for OSA on CPAP and migraines. She continues venlafaxine 728mat bedtime and sumatriptan as needed. Oral usually helps but may use injections for intractable migraines. For the past 8 weeks, she feels that she has had a headache every weekend. She seems to have a headache when she wakes on Saturday. She lost her oral sumatriptan and has used injections that seem to help. She is not able to identify specific triggers.   She is doing well with CPAP therapy. She does note worsening leak, recently, and feels it is related to needing a new mask. She will continue to monitor at home.     09/11/2019 [name]:  [name] a [age].o. female here today for follow up for migraines and OSA on CPAP. She is doing well. Migraines are well managed with sumatriptan for abortive therapy. She is also taking venlafaxine 7549mt bedtime. She is using CPAP nightly. She continues to note a leak with her Dreamwear FFM. She has days where she feels really tired. She feels this is related to her sleep patterns. She is working on getting to bed around 9pm consistently. She wakes around 5.   Compliance report dated 08/11/2019 through 09/09/2019 reveals that she used CPAP 30 of the past 30 days for compliance of 100%.  She used CPAP greater than 4 hours 29 of the last 30 days for compliance of 97%.  Average usage was 6 hours and 42 minutes.  Residual AHI was 0.8 on 7 to 13 cm of water and an EPR of 1.  There was a leak noted in the 95th percentile of 32.2.  HISTORY: (copied from my  note on 03/14/2019)  [name] a [age]. female here today for follow up of OSA on CPAP and migraines. She is doing very well.  She reports near daily compliance with CPAP therapy.  She denies any difficulty with the machine with the exception of noting a leak.  She is using a DreamWear full facemask.  Over the past few weeks she has noted that the air is leaking around the mask.  She replaces her supplies regularly.  She has not reached out to the DME company.  She does feel that fatigue levels have significantly improved.  She is recently recovering from Covid pneumonia.  She was hospitalized for 5 days in January.  She reports that at baseline.   Migraines are well managed.  She continues Effexor for preventative care.  She has approximately 2-3 migraines per month.  She is using Imitrex as needed for abortive therapy.  She has both tablet and injection forms of Imitrex.  Both work very well for abortive therapy.  She does feel that Imitrex tablets make her have more joint stiffness for about 24 hours.  She does not feel that this is worrisome.  No other side effects.  No chest pain, trouble breathing or dizziness.   Compliance report dated 02/12/2019 through 03/13/2019 reveals that she used CPAP 27 of the last 30 days for  compliance of 90%.  She used CPAP greater than 4 hours 26 of 30 days for compliance of 87%.  Average usage was 6 hours and 48 minutes.  Residual AHI was 0.2 on 7 to 13 cm of water and an EPR of 1.  There was a significant leak noted in the 95th percentile of 29.3 L/min.   HISTORY: (copied from my note on 09/11/2018)   [name] is a 61 y.o. female here today for follow up OSA on CPAP. She continues to have concerns of excessive daytime sleepiness and fatigue. She is compliant with therapy. She feels that she was feeling better until about 2 weeks ago. She states that she has fallen asleep twice at work. She has never done this before. She reports that once, she was dreaming.  She is caring for a dog with seizures. She is up and down throughout the night. She continues Effexor 68m for migraine prevention. She is taking magnesium with Melatonin at night as well. She does not exercise. She was recently started on metformin 10087mtwice daily. 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 She has gained about 30 pounds in the past year. She is working with [facility] to help with weight management. She is also caring for her elderly mother.    Compliance report dated 08/12/2018 through 09/10/2018 reveals that she is using CPAP nightly and greater than 4 hours each night for compliance of 100%.  Average usage is 6 hours and 56 minutes.  AHI was 0.5 on 7 to 13 cm of water and an EPR of 1.  There was no significant leak.     HISTORY: (copied from Dr AtGuadelupe Sabinote on 03/26/2018)   Interim history:    Ms. DuWattersons a [age]ear old right-handed woman with an underlying medical history of migraine headaches, hyperlipidemia, factor V deficiency, depression, anxiety, history of DVT, history of varicose veins, and obesity, who presents for follow-up consultation of her obstructive sleep apnea, after a recent onset sleep testing and starting AutoPap therapy. The patient is unaccompanied today. I first met her on 01/22/2018 at the request of Dr. YaKrista Blueat which time she reported snoring and daytime somnolence as well as witnessed apneas. Her Epworth sleepiness score was 17 at the time.   Today, 03/26/2018: I reviewed her AutoPap compliance data from 02/24/2018 through 03/25/2018 which is a total of 30 days, during which time she used her AutoPap every night with percent used days greater than 4 hours at 100%, indicating superb compliance with an average usage of 6 hours and 46 minutes, residual AHI at goal at 0.5 per hour, 95th percentile pressure at 11.7 cm, leak on the high side with the 95th percentile at 19.8 L/m on a pressure range of 7 cm to 13 cm. She  reports that she is still sleepy. She even feels that she is more sleepy as she has started using the AutoPap. Her Epworth sleepiness score is 19 out of 24 today. Looking back at her medication history, she restarted Effexor long-acting in September 2019, she had stopped it in or around May 2018. When she saw Dr. YaKrista Bluen October 2019 the Effexor was increased to 75 mg daily. She takes it at night. She does admit that it could be correlated. She also has a dog that has to be up early, she tries to keep a scheduled for this. She tries to keep a schedule on the weekends 2. She does  report that her headaches are better [name] month of February since she has been consistent with her AutoPap. In fact, her headaches are much improved. Previously, she had been on Topamax which caused side effects and she also tried talks from migraines. She is motivated to continue with treatment. She has gained weight over time. She has a more sedentary job for the past year.     The patient's allergies, current medications, family history, past medical history, past social history, past surgical history and problem list were reviewed and updated as appropriate.    Previously:    01/22/2018: She reports snoring and excessive daytime somnolence. I reviewed your office note from 10/19/2017. She has had recurrent headaches. Her Epworth sleepiness score is 17 out of 24, fatigue score is 41 out of 63. She is divorced and lives alone, no children. She is a nonsmoker and drinks alcohol infrequently, caffeine not daily, 1-2 cups 3 or 4 times a week on average. She has a 40 minute commute and has become sleepy while driving. She works as an Lawyer. She tries to be in bed before 10 and arise time is currently around 5. She has a dog that needs to get out around that time. She has a TV in her bedroom, typically on a sleep timer, she has had some morning headaches. She has nocturia about once per average night, is not aware of any family  history of OSA. She gained weight starting in 2006. Her sister has noted her loud snoring but also pauses in her breathing and patient has woken herself up with a sense of gasping for air at times.    REVIEW OF SYSTEMS: Out of a complete 14 system review of symptoms, the patient complains only of the following symptoms, headaches, fatigue, joint and muscle aches and [name] other reviewed systems are negative.    ALLERGIES: Allergies  Allergen Reactions   Statins Other (See Comments)    Pt states she has severe joint and body aches.   Codeine Hives    rash    HOME MEDICATIONS: Outpatient Medications Prior to Visit  Medication Sig Dispense Refill   aspirin EC 325 MG tablet Take 162.5 mg by mouth daily.     Cholecalciferol (VITAMIN D3 PO) Take 5,000 Units by mouth daily.     furosemide (LASIX) 20 MG tablet Take 20 mg by mouth daily.     ibuprofen (ADVIL,MOTRIN) 200 MG tablet Take 400 mg by mouth every 6 (six) hours as needed for mild pain.     Magnesium 400 MG TABS Take 800 mg by mouth daily.     metoprolol succinate (TOPROL-XL) 25 MG 24 hr tablet Take 1 tablet (25 mg total) by mouth daily. 90 tablet 3   OZEMPIC, 0.25 OR 0.5 MG/DOSE, 2 MG/3ML SOPN Inject into the skin.     SUMAtriptan (IMITREX) 100 MG tablet Take 1 tab at onset of migraine.  May repeat in 2 hrs, if needed.  Max dose: 2 tabs/day. This is a 90 day prescription. 27 tablet 4   SUMAtriptan (IMITREX) 6 MG/0.5ML SOLN injection Inject 6 mg into the skin daily as needed for migraine.     venlafaxine XR (EFFEXOR-XR) 75 MG 24 hr capsule Take 1 capsule (75 mg total) by mouth at bedtime. 90 capsule 3   No facility-administered medications prior to visit.    PAST MEDICAL HISTORY: Past Medical History:  Diagnosis Date   Allergy    Anxiety    Atypical chest pain 08/24/2011  Blood clot in vein    right leg   Chest tightness    Chronic migraine 10/19/2017   Chronic venous insufficiency 05/23/2013   Linus Mako, MD Continue  [name] current conservative treatment. 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 Seeking approval for this treatment and will proceed as soon as is approval is obtained.    Depression    Diabetes ([place]) 07/17/2014   Factor 5 Leiden mutation, heterozygous ([place]) 07/17/2014   Factor V deficiency ([place])    Family history of premature coronary artery disease 08/24/2011   History of adverse effect of venous thromboembolism (VTE) prophylaxis 02/04/2019   History of COVID-19 02/14/2019   Hypercholesterolemia 08/24/2011   Hyperlipidemia    Migraine    Migraine, chronic, without aura, intractable 09/13/2012   Botox approved diagnosis.    Morbid obesity ([place]) 09/07/2018   Morbid obesity with BMI of 40.0-44.9, adult ([place])    Neuralgia 07/30/2015   Paroxysmal SVT (supraventricular tachycardia) ([place]) 02/04/2019   Pneumonia due to COVID-19 virus 02/04/2019   Sleep apnea 10/19/2017   Trochanteric bursitis of left hip 11/12/2019   Trochanteric bursitis of right hip 11/12/2019   Type 2 diabetes mellitus without complication, without long-term current use of insulin ([place]) 09/08/2018   Venous thrombosis 12/10/2013   Occurred after treatment for varicose veins, she is on xarelto    Vitamin D deficiency 07/17/2014    PAST SURGICAL HISTORY: Past Surgical History:  Procedure Laterality Date   TUBAL LIGATION  2006   VEIN LIGATION Bilateral     FAMILY HISTORY: Family History  Problem Relation Age of Onset   Diabetes Mother 4       type 2   Diabetes Father 74       type 2   Dementia Father 84   Hyperlipidemia Father    Diabetes Sister    Hyperlipidemia Sister    Hypertension Sister    Diabetes Brother    Heart disease Brother    Hyperlipidemia Brother    Hypertension Brother    Breast cancer Sister     SOCIAL HISTORY: Social History   Socioeconomic History   Marital status: Divorced    Spouse name: Not on file   Number  of children: 0   Years of education: 12   Highest education level: Not on file  Occupational History   Occupation: Higher education careers adviser: [employer]  Tobacco Use   Smoking status: Never   Smokeless tobacco: Never  Vaping Use   Vaping Use: Never used  Substance and Sexual Activity   Alcohol use: Yes    Alcohol/week: 1.0 standard drink of alcohol    Types: 1 Glasses of wine per week    Comment: OCC   Drug use: No   Sexual activity: Not on file  Other Topics Concern   Not on file  Social History Narrative   Patient is divorced. Patient has a high school education and works for [employer].    Right handed.   Caffeine- two daily            Social Determinants of Health   Financial Resource Strain: Not on file  Food Insecurity: Not on file  Transportation Needs: Not on file  Physical Activity: Not on file  Stress: Not on file  Social Connections: Not on file  Intimate Partner Violence: Not on file      PHYSICAL EXAM  There were no vitals filed for  this visit.   There is no height or weight on file to calculate BMI.  Generalized: Well developed, in no acute distress  Cardiology: normal rate and rhythm, no murmur noted Respiratory: clear to auscultation bilaterally  Neurological examination  Mentation: Alert oriented to time, place, history taking. Follows [name] commands speech and language fluent Cranial nerve II-XII: Pupils were equal round reactive to light. Extraocular movements were full, visual field were full  Motor: The motor testing reveals 5 over 5 strength of [name] 4 extremities. Good symmetric motor tone is noted throughout.  Gait and station: Gait is normal.   DIAGNOSTIC DATA (LABS, IMAGING, TESTING) - I reviewed patient records, labs, notes, testing and imaging myself where available.      No data to display           Lab Results  Component Value Date   WBC 6.9 02/08/2019   HGB 12.2 02/08/2019   HCT 38.0 02/08/2019   MCV  85.4 02/08/2019   PLT 319 02/08/2019      Component Value Date/Time   NA 136 02/08/2019 0315   NA 141 01/13/2015 0853   K 5.0 02/08/2019 0315   K 4.3 01/13/2015 0853   CL 106 02/08/2019 0315   CO2 20 (L) 02/08/2019 0315   CO2 25 01/13/2015 0853   GLUCOSE 273 (H) 02/08/2019 0315   GLUCOSE 130 01/13/2015 0853   BUN 28 (H) 02/08/2019 0315   BUN 14.3 01/13/2015 0853   CREATININE 0.83 02/08/2019 0315   CREATININE 0.9 01/13/2015 0853   CALCIUM 8.6 (L) 02/08/2019 0315   CALCIUM 9.5 01/13/2015 0853   PROT 5.8 (L) 02/08/2019 0315   PROT 7.4 01/13/2015 0853   ALBUMIN 2.6 (L) 02/08/2019 0315   ALBUMIN 3.7 01/13/2015 0853   AST 17 02/08/2019 0315   AST 12 01/13/2015 0853   ALT 18 02/08/2019 0315   ALT 14 01/13/2015 0853   ALKPHOS 59 02/08/2019 0315   ALKPHOS 102 01/13/2015 0853   BILITOT 0.5 02/08/2019 0315   BILITOT 0.48 01/13/2015 0853   GFRNONAA >60 02/08/2019 0315   GFRNONAA 83 12/25/2012 0814   GFRAA >60 02/08/2019 0315   GFRAA >89 12/25/2012 0814   Lab Results  Component Value Date   CHOL 217 (H) 12/25/2012   HDL 52 12/25/2012   LDLCALC 140 (H) 12/25/2012   TRIG 126 12/25/2012   CHOLHDL 4.2 12/25/2012   Lab Results  Component Value Date   HGBA1C 7.5 (H) 02/06/2019   Lab Results  Component Value Date   VITAMINB12 440 01/13/2015   Lab Results  Component Value Date   TSH 3.140 12/25/2012       ASSESSMENT AND PLAN 61 y.o. year old female  has a past medical history of Allergy, Anxiety, Atypical chest pain (08/24/2011), Blood clot in vein, Chest tightness, Chronic migraine (10/19/2017), Chronic venous insufficiency (05/23/2013), Depression, Diabetes ([place]) (07/17/2014), Factor 5 Leiden mutation, heterozygous ([place]) (07/17/2014), Factor V deficiency ([place]), Family history of premature coronary artery disease (08/24/2011), History of adverse effect of venous thromboembolism (VTE) prophylaxis (02/04/2019), History of COVID-19 (02/14/2019), Hypercholesterolemia (08/24/2011),  Hyperlipidemia, Migraine, Migraine, chronic, without aura, intractable (09/13/2012), Morbid obesity ([place]) (09/07/2018), Morbid obesity with BMI of 40.0-44.9, adult ([place]), Neuralgia (07/30/2015), Paroxysmal SVT (supraventricular tachycardia) ([place]) (02/04/2019), Pneumonia due to COVID-19 virus (02/04/2019), Sleep apnea (10/19/2017), Trochanteric bursitis of left hip (11/12/2019), Trochanteric bursitis of right hip (11/12/2019), Type 2 diabetes mellitus without complication, without long-term current use of insulin ([place]) (09/08/2018), Venous thrombosis (12/10/2013), and Vitamin D deficiency (07/17/2014). here  with   No diagnosis found.    Kieli is doing well on CPAP therapy.  Migraines are usually well managed. She has noticed recurring headaches on Saturday and Sunday for the past 8 weekend. I will have her take Nurtec 11m every other day for 12 days, then daily as needed. She was given 6 sample tablets and [place] [facility] May consider CGRP injection if migraines continue to worsen. She will continue venlafaxine 75 mg every day and sumatriptan as needed. Compliance report reveals excellent compliance.  She was encouraged to continue using CPAP nightly and for greater than 4 hours each night. She will continue to monitor for leak at home. I have encouraged her to focus on healthy lifestyle habits.  Adequate hydration, well-balanced diet and sleep hygiene reviewed.  She will continue close follow-up with her primary care provider.  She will follow-up with [facility] 6-12 months. She verbalizes understanding and agreement with this plan.   No orders of the defined types were placed in this encounter.     No orders of the defined types were placed in this encounter.     ADebbora Presto FNP-C 09/14/2021, 9:23 AM [facility] [address] [place] [phone]

## 2021-09-15 ENCOUNTER — Ambulatory Visit (INDEPENDENT_AMBULATORY_CARE_PROVIDER_SITE_OTHER): Payer: BC Managed Care – PPO | Admitting: Family Medicine

## 2021-09-15 ENCOUNTER — Encounter: Payer: Self-pay | Admitting: Family Medicine

## 2021-09-15 VITALS — BP 130/84 | HR 76 | Ht 65.0 in | Wt 226.6 lb

## 2021-09-15 DIAGNOSIS — G4733 Obstructive sleep apnea (adult) (pediatric): Secondary | ICD-10-CM

## 2021-09-15 DIAGNOSIS — Z9989 Dependence on other enabling machines and devices: Secondary | ICD-10-CM | POA: Diagnosis not present

## 2021-09-15 DIAGNOSIS — G43709 Chronic migraine without aura, not intractable, without status migrainosus: Secondary | ICD-10-CM

## 2021-09-15 DIAGNOSIS — G43719 Chronic migraine without aura, intractable, without status migrainosus: Secondary | ICD-10-CM

## 2021-09-15 MED ORDER — VENLAFAXINE HCL ER 75 MG PO CP24: 75.0000 mg | ORAL_CAPSULE | Freq: Every day | ORAL | 3 refills | Status: DC

## 2021-09-16 ENCOUNTER — Telehealth: Payer: Self-pay | Admitting: Neurology

## 2021-09-16 NOTE — Telephone Encounter (Signed)
PA completed on CMM/[facility] KEY: [id number] Will await determination

## 2021-10-21 NOTE — Telephone Encounter (Signed)
PA denied. However, it was last denied 03/2021 and therapy changed to sumatriptan.

## 2022-06-09 ENCOUNTER — Other Ambulatory Visit: Payer: Self-pay | Admitting: Cardiology

## 2022-06-09 NOTE — Telephone Encounter (Signed)
Rx sent to pharmacy   

## 2022-07-05 ENCOUNTER — Other Ambulatory Visit: Payer: Self-pay | Admitting: *Deleted

## 2022-07-05 MED ORDER — SUMATRIPTAN SUCCINATE 6 MG/0.5ML ~~LOC~~ SOLN: 6.0000 mg | Freq: Every day | SUBCUTANEOUS | 5 refills | Status: DC | PRN

## 2022-07-05 NOTE — Telephone Encounter (Signed)
[name] I don't see that Rx for sumatriptan injection has been prescribed for patient at this office. I didn't want to sign Rx if it has never been prescribed here yet.   Last seen on 09/15/21 per note "  Sumatriptan works well for abortive therapy. She uses sumatriptan injections 2-3 times a month and 2-3 tablets a month. '   Follow up scheduled on 09/27/22

## 2022-07-06 ENCOUNTER — Other Ambulatory Visit: Payer: Self-pay

## 2022-07-06 DIAGNOSIS — G43719 Chronic migraine without aura, intractable, without status migrainosus: Secondary | ICD-10-CM

## 2022-07-06 MED ORDER — SUMATRIPTAN SUCCINATE 100 MG PO TABS
ORAL_TABLET | ORAL | 4 refills | Status: DC
Start: 2022-07-06 — End: 2022-09-24

## 2022-09-03 ENCOUNTER — Other Ambulatory Visit: Payer: Self-pay | Admitting: Cardiology

## 2022-09-06 ENCOUNTER — Other Ambulatory Visit: Payer: Self-pay

## 2022-09-06 ENCOUNTER — Ambulatory Visit: Payer: No Typology Code available for payment source

## 2022-09-06 VITALS — BP 100/65 | HR 72 | Ht 65.0 in | Wt 226.0 lb

## 2022-09-06 DIAGNOSIS — E7800 Pure hypercholesterolemia, unspecified: Secondary | ICD-10-CM | POA: Diagnosis not present

## 2022-09-06 DIAGNOSIS — G43719 Chronic migraine without aura, intractable, without status migrainosus: Secondary | ICD-10-CM

## 2022-09-06 DIAGNOSIS — I471 Supraventricular tachycardia, unspecified: Secondary | ICD-10-CM

## 2022-09-06 DIAGNOSIS — R7303 Prediabetes: Secondary | ICD-10-CM

## 2022-09-06 HISTORY — DX: Prediabetes: R73.03

## 2022-09-06 MED ORDER — VENLAFAXINE HCL ER 75 MG PO CP24
75.0000 mg | ORAL_CAPSULE | Freq: Every day | ORAL | 0 refills | Status: DC
Start: 2022-09-06 — End: 2022-09-24

## 2022-09-06 NOTE — Assessment & Plan Note (Signed)
Currently off any lipid-lowering medications. She is aggressively managing this with diabetes control and dietary modification. Would recommend targeting LDL levels below 70 mg/dL.  She mentions regular checks for lipid panel with her PCP and she will have the PCPs office forward those results to [place] for our records.

## 2022-09-06 NOTE — Assessment & Plan Note (Signed)
Paroxysmal SVT episode last episode requiring ER visit was in December 2020. Very infrequent episode since and able to terminate with vagal maneuvers by herself. Tolerating current dose of metoprolol succinate well.  We discussed benign nature of supraventricular tachyarrhythmias. We discussed potential triggers such as dehydration, stress, lack of sleep, moderate to heavy exertion. We discussed importance of regular exercise to help improve heart rate variability.  We discussed and reviewed various vagal maneuver options that she can try to abort the episode. We discussed the importance of seeking care if episodes are more frequent and persistent.  Alternative options for antiarrhythmic medications versus ablation if she has recurrent episodes were reviewed.

## 2022-09-06 NOTE — Patient Instructions (Signed)
Medication Instructions:  Your physician recommends that you continue on your current medications as directed. Please refer to the Current Medication list given to you today.  *If you need a refill on your cardiac medications before your next appointment, please call your pharmacy*   Lab Work: None If you have labs (blood work) drawn today and your tests are completely normal, you will receive your results only by: MyChart Message (if you have MyChart) OR A paper copy in the mail If you have any lab test that is abnormal or we need to change your treatment, we will call you to review the results.   Testing/Procedures: None   Follow-Up: At [facility], you and your health needs are our priority.  As part of our continuing mission to provide you with exceptional heart care, we have created designated Provider Care Teams.  These Care Teams include your primary Cardiologist (physician) and Advanced Practice Providers (APPs -  Physician Assistants and Nurse Practitioners) who all work together to provide you with the care you need, when you need it.  We recommend signing up for the patient portal called "MyChart".  Sign up information is provided on this After Visit Summary.  MyChart is used to connect with patients for Virtual Visits (Telemedicine).  Patients are able to view lab/test results, encounter notes, upcoming appointments, etc.  Non-urgent messages can be sent to your provider as well.   To learn more about what you can do with MyChart, go to [email or web address].    Your next appointment:   1 year(s)  Provider:   Dr. Vincent Gros  Other Instructions None

## 2022-09-06 NOTE — Progress Notes (Signed)
Cardiology Consultation:    Date:  09/06/2022   ID:  [name], DOB 1960/04/10, MRN [id number]  PCP:  Normajean Glasgow, FNP  Cardiologist:  Luretha Murphy, MD   Referring MD: Elizabeth Palau, FNP    History of Present Illness:    [name] is a 62 y.o. female who is being seen today for follow-up of her hyperlipidemia at the request of Elizabeth Palau, FNP.   She previously followed up with Dr. Dulce Sellar, last office visit Jun 15, 2021.  Here for her annual follow-up visit today. Has her PCP in [place].  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  She has history of paroxysmal SVT, hyperlipidemia, diabetes, right leg DVT in the past [infrapopliteal in the setting of OCP use], heterozygous for factor V Leiden mutation [has remained on aspirin 81 mg once daily], obesity, sleep apnea uses CPAP.  She had an episode of paroxysmal SVT sustained that required adenosine for conversion in the emergency room back in December 2020. Ever since then she has not had any significant episodes that required in hospital dimension.  Describes short episodes once or twice a year where she feels her heart rate racing, can last for few minutes.  She does try to abort these episodes with vagal maneuvers such as laying down and trying to bear down.  She had 1 such episode couple months ago after walking uphill, felt her heart racing and lasted for about 30 minutes before it terminated when she attempted vagal maneuvers in the house.  She consistently takes metoprolol.  Has good functional status.  Walks her dog regularly. Denies any chest pain or shortness of breath, orthopnea paroxysmal nocturnal dyspnea.  Denies any syncopal episodes.  Denies any pedal edema.  Good compliance with her CPAP at home. Mentions lipid panel through her PCPs office visits have been under good control. She has been on Ozempic for the past year and has been able to lose weight.  She has minimal side  effects of constipation and nausea initially, but now tolerating the medication well.    Past Medical History:  Diagnosis Date   Allergy    Anxiety    Atypical chest pain 08/24/2011   Blood clot in vein    right leg   Chest tightness    Chronic migraine 10/19/2017   Chronic venous insufficiency 05/23/2013   [name], MD Continue all current conservative treatment. 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 Seeking approval for this treatment and will proceed as soon as is approval is obtained.    Depression    Diabetes (HCC) 07/17/2014   Factor 5 Leiden mutation, heterozygous (HCC) 07/17/2014   Factor V deficiency (HCC)    Family history of premature coronary artery disease 08/24/2011   History of adverse effect of venous thromboembolism (VTE) prophylaxis 02/04/2019   History of COVID-19 02/14/2019   Hypercholesterolemia 08/24/2011   Hyperlipidemia    Migraine    Migraine, chronic, without aura, intractable 09/13/2012   Botox approved diagnosis.    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   aspirin EC 325 MG tablet Take 162.5  mg by mouth daily.   cyanocobalamin (VITAMIN B12) 1000 MCG tablet Take 1,000 mcg by mouth daily.   ibuprofen (ADVIL,MOTRIN) 200 MG tablet Take 400 mg by mouth every 6 (six) hours as needed for mild pain.   Magnesium 400 MG TABS Take 800 mg by mouth daily.   metoprolol succinate (TOPROL-XL) 25 MG 24 hr tablet Take 1 tablet by mouth once daily   OZEMPIC, 1 MG/DOSE, 4 MG/3ML SOPN Inject 1 mg into the skin once a week.   pyridOXINE (VITAMIN B6) 100 MG tablet Take 100 mg by mouth daily.   SUMAtriptan (IMITREX) 100 MG tablet Take 1 tab at onset of migraine.  May repeat in 2 hrs, if needed.  Max dose: 2 tabs/day. This is a 90 day prescription.   SUMAtriptan (IMITREX) 6 MG/0.5ML SOLN injection Inject 0.5 mLs (6 mg total) into the skin daily as needed for migraine.   venlafaxine XR (EFFEXOR-XR) 75 MG 24 hr capsule Take 1 capsule (75 mg total) by mouth at bedtime.   Vitamin D, Ergocalciferol, (DRISDOL) 1.25 MG (50000 UNIT) CAPS capsule Take 50,000 Units by mouth once a week.     Allergies:   Statins and Codeine   Social History   Socioeconomic History   Marital status: Divorced    Spouse name: Not on file   Number of children: 0   Years of education: 12   Highest education level: Not on file  Occupational History   Occupation: Engineering geologist: [employer]  Tobacco Use   Smoking status: Never   Smokeless tobacco: Never  Vaping Use   Vaping status: Never Used  Substance and Sexual Activity   Alcohol use: Yes    Alcohol/week: 1.0 standard drink of alcohol    Types: 1 Glasses of wine per week    Comment: [facility]   Drug use: No   Sexual activity: Not on file  Other Topics Concern   Not on file  Social History Narrative   Patient is divorced. Patient has a high school education and works for [employer].    Right handed.   Caffeine- two daily            Social Determinants of Health   Financial Resource Strain: Not on file  Food Insecurity: No Food Insecurity  (12/25/2020)   Received from [facility]   Hunger Vital Sign    Worried About Running Out of Food in the Last Year: Never true    Ran Out of Food in the Last Year: Never true  Transportation Needs: Not on file  Physical Activity: Not on file  Stress: Not on file  Social Connections: Unknown (05/27/2021)   Received from [facility]   Social Network    Social Network: Not on file     Family History: The patient's family history includes Breast cancer in her sister; Dementia (age of onset: 28) in her father; Diabetes in her brother and sister; Diabetes (age of onset: 9) in her father;  Diabetes (age of onset: 69) in her mother; Heart disease in her brother; Hyperlipidemia in her brother, father, and sister; Hypertension in her brother and sister. ROS:   Please see the history of present illness.    All 14 point review of systems negative except as described per history of present illness.  EKGs/Labs/Other Studies Reviewed:    The following studies were reviewed today:   EKG:       Recent Labs: No results found for requested labs within last 365 days.  Recent Lipid Panel    Component Value Date/Time   CHOL 217 (H) 12/25/2012 0814   TRIG 126 12/25/2012 0814   HDL 52 12/25/2012 0814   CHOLHDL 4.2 12/25/2012 0814   VLDL 25 12/25/2012 0814   LDLCALC 140 (H) 12/25/2012 0814    Physical Exam:    VS:  BP 100/65   Pulse 72   Ht 5\' 5"  (1.651 m)   Wt 226 lb (102.5 kg)   LMP 05/31/2011   SpO2 96%   BMI 37.61 kg/m     Wt Readings from Last 3 Encounters:  09/06/22 226 lb (102.5 kg)  09/15/21 226 lb 9.6 oz (102.8 kg)  06/15/21 235 lb 0.6 oz (106.6 kg)     GENERAL:  Well nourished, well developed in no acute distress NECK: No JVD; No carotid bruits CARDIAC: RRR, S1 and S2 present, no murmurs, no rubs, no gallops CHEST:  Clear to auscultation without rales, wheezing or rhonchi  Extremities: No pitting pedal edema. Pulses bilaterally symmetric with radial 2+ and dorsalis  pedis 2+ NEUROLOGIC:  Alert and oriented x 3   ASSESSMENT AND PLAN:    Problem List Items Addressed This Visit     Paroxysmal SVT (supraventricular tachycardia)    Paroxysmal SVT episode last episode requiring ER visit was in December 2020. Very infrequent episode since and able to terminate with vagal maneuvers by herself. Tolerating current dose of metoprolol succinate well.  We discussed benign nature of supraventricular tachyarrhythmias. We discussed potential triggers such as dehydration, stress, lack of sleep, moderate to heavy exertion. We discussed importance of regular exercise to help improve heart rate variability.  We discussed and reviewed various vagal maneuver options that she can try to abort the episode. We discussed the importance of seeking care if episodes are more frequent and persistent.  Alternative options for antiarrhythmic medications versus ablation if she has recurrent episodes were reviewed.      Hyperlipidemia - Primary    Currently off any lipid-lowering medications. She is aggressively managing this with diabetes control and dietary modification. Would recommend targeting LDL levels below 70 mg/dL.  She mentions regular checks for lipid panel with her PCP and she will have the PCPs office forward those results to [place] for our records.       Relevant Orders   EKG 12-Lead (Completed)  Offered to see [place] as needed, she wishes to continue following up on an annual basis which I think is reasonable. Return to clinic in 12 months or as needed.   Medication Adjustments/Labs and Tests Ordered: Current medicines are reviewed at length with the patient today.  Concerns regarding medicines are outlined above.  Orders Placed This Encounter  Procedures   EKG 12-Lead   No orders of the defined types were placed in this encounter.   Signed, Cecille Amsterdam, MD, MPH, [facility]. 09/06/2022 4:30 PM    [facility]

## 2022-09-24 NOTE — Progress Notes (Unsigned)
PATIENT: [name] DOB: 1960/12/10  REASON FOR VISIT: follow up HISTORY FROM: patient  No chief complaint on file.   Dr Terrace Arabia: headaches Dr Frances Furbish: sleep    HISTORY OF PRESENT ILLNESS:  09/24/22 [name] returns for follow up for OSA on CPAP and migraines.   She continues venlafaxine 75mg  daily and sumatriptan as needed.  She usually takes sumatriptan tablets about  She uses sumatriptan injections about   09/15/2021 ALL: [name] returns for follow up for OSA on CPAP and migraines. She continues to do well on therapy. She is using CPAP every night. She does note improved sleep quality when using CPAP. She may not change out her mask as often as she should and has noted an air leak.   She continues venlafaxine 75mg  QHS and sumatriptan as needed. Nurtec worked well but not covered when she lost her insurance. She feels headaches are well managed. She may have a few more if she has difficulty sleeping. She may have 3-6 headaches a month. Sumatriptan works well for abortive therapy. She uses sumatriptan injections 2-3 times a month and 2-3 tablets a month.     09/14/2020 ALL: [name] returns for follow up for OSA on CPAP and migraines. She continues venlafaxine 75mg  at bedtime and sumatriptan as needed. Oral usually helps but may use injections for intractable migraines. For the past 8 weeks, she feels that she has had a headache every weekend. She seems to have a headache when she wakes on Saturday. She lost her oral sumatriptan and has used injections that seem to help. She is not able to identify specific triggers.   She is doing well with CPAP therapy. She does note worsening leak, recently, and feels it is related to needing a new mask. She will continue to monitor at home.     09/11/2019 ALL:  [name] is a 62 y.o. female here today for follow up for migraines and OSA on CPAP. She is doing well. Migraines are well managed with sumatriptan for abortive therapy. She is  also taking venlafaxine 75mg  at bedtime. She is using CPAP nightly. She continues to note a leak with her Dreamwear FFM. She has days where she feels really tired. She feels this is related to her sleep patterns. She is working on getting to bed around 9pm consistently. She wakes around 5.   Compliance report dated 08/11/2019 through 09/09/2019 reveals that she used CPAP 30 of the past 30 days for compliance of 100%.  She used CPAP greater than 4 hours 29 of the last 30 days for compliance of 97%.  Average usage was 6 hours and 42 minutes.  Residual AHI was 0.8 on 7 to 13 cm of water and an EPR of 1.  There was a leak noted in the 95th percentile of 32.2.  HISTORY: (copied from my note on 03/14/2019)  [name] is a 62 y.o. female here today for follow up of OSA on CPAP and migraines. She is doing very well.  She reports near daily compliance with CPAP therapy.  She denies any difficulty with the machine with the exception of noting a leak.  She is using a DreamWear full facemask.  Over the past few weeks she has noted that the air is leaking around the mask.  She replaces her supplies regularly.  She has not reached out to the DME company.  She does feel that fatigue levels have significantly improved.  She is recently recovering from Covid  pneumonia.  She was hospitalized for 5 days in January.  She reports that at baseline.   Migraines are well managed.  She continues Effexor for preventative care.  She has approximately 2-3 migraines per month.  She is using Imitrex as needed for abortive therapy.  She has both tablet and injection forms of Imitrex.  Both work very well for abortive therapy.  She does feel that Imitrex tablets make her have more joint stiffness for about 24 hours.  She does not feel that this is worrisome.  No other side effects.  No chest pain, trouble breathing or dizziness.   Compliance report dated 02/12/2019 through 03/13/2019 reveals that she used CPAP 27 of the last 30 days for  compliance of 90%.  She used CPAP greater than 4 hours 26 of 30 days for compliance of 87%.  Average usage was 6 hours and 48 minutes.  Residual AHI was 0.2 on 7 to 13 cm of water and an EPR of 1.  There was a significant leak noted in the 95th percentile of 29.3 L/min.   HISTORY: (copied from my note on 09/11/2018)   [name] is a 62 y.o. female here today for follow up OSA on CPAP. She continues to have concerns of excessive daytime sleepiness and fatigue. She is compliant with therapy. She feels that she was feeling better until about 2 weeks ago. She states that she has fallen asleep twice at work. She has never done this before. She reports that once, she was dreaming. She is caring for a dog with seizures. She is up and down throughout the night. She continues Effexor 75mg  for migraine prevention. She is taking magnesium with Melatonin at night as well. She does not exercise. She was recently started on metformin 1000mg  twice daily. 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 She has gained about 30 pounds in the past year. She is working with Elizabeth Palau with [facility] to help with weight management. She is also caring for her elderly mother.    Compliance report dated 08/12/2018 through 09/10/2018 reveals that she is using CPAP nightly and greater than 4 hours each night for compliance of 100%.  Average usage is 6 hours and 56 minutes.  AHI was 0.5 on 7 to 13 cm of water and an EPR of 1.  There was no significant leak.     HISTORY: (copied from Dr Teofilo Pod note on 03/26/2018)   Interim history:    [name] is a 62 year old right-handed woman with an underlying medical history of migraine headaches, hyperlipidemia, factor V deficiency, depression, anxiety, history of DVT, history of varicose veins, and obesity, who presents for follow-up consultation of her obstructive sleep apnea, after a recent onset sleep testing and starting AutoPap therapy. The patient is  unaccompanied today. I first met her on 01/22/2018 at the request of Dr. Terrace Arabia, at which time she reported snoring and daytime somnolence as well as witnessed apneas. Her Epworth sleepiness score was 17 at the time.   Today, 03/26/2018: I reviewed her AutoPap compliance data from 02/24/2018 through 03/25/2018 which is a total of 30 days, during which time she used her AutoPap every night with percent used days greater than 4 hours at 100%, indicating superb compliance with an average usage of 6 hours and 46 minutes, residual AHI at goal at 0.5 per hour, 95th percentile pressure at 11.7 cm, leak on the high side with the 95th percentile at 19.8 L/m  on a pressure range of 7 cm to 13 cm. She reports that she is still sleepy. She even feels that she is more sleepy as she has started using the AutoPap. Her Epworth sleepiness score is 19 out of 24 today. Looking back at her medication history, she restarted Effexor long-acting in September 2019, she had stopped it in or around May 2018. When she saw Dr. Terrace Arabia in October 2019 the Effexor was increased to 75 mg daily. She takes it at night. She does admit that it could be correlated. She also has a dog that has to be up early, she tries to keep a scheduled for this. She tries to keep a schedule on the weekends 2. She does report that her headaches are better all month of February since she has been consistent with her AutoPap. In fact, her headaches are much improved. Previously, she had been on Topamax which caused side effects and she also tried talks from migraines. She is motivated to continue with treatment. She has gained weight over time. She has a more sedentary job for the past year.     The patient's allergies, current medications, family history, past medical history, past social history, past surgical history and problem list were reviewed and updated as appropriate.    Previously:    01/22/2018: She reports snoring and excessive daytime somnolence. I  reviewed your office note from 10/19/2017. She has had recurrent headaches. Her Epworth sleepiness score is 17 out of 24, fatigue score is 41 out of 63. She is divorced and lives alone, no children. She is a nonsmoker and drinks alcohol infrequently, caffeine not daily, 1-2 cups 3 or 4 times a week on average. She has a 40 minute commute and has become sleepy while driving. She works as an Administrator, [employer]. She tries to be in bed before 10 and arise time is currently around 5. She has a dog that needs to get out around that time. She has a TV in her bedroom, typically on a sleep timer, she has had some morning headaches. She has nocturia about once per average night, is not aware of any family history of OSA. She gained weight starting in 2006. Her sister has noted her loud snoring but also pauses in her breathing and patient has woken herself up with a sense of gasping for air at times.    REVIEW OF SYSTEMS: Out of a complete 14 system review of symptoms, the patient complains only of the following symptoms, headaches, fatigue, joint and muscle aches and all other reviewed systems are negative.    ALLERGIES: Allergies  Allergen Reactions   Statins Other (See Comments)    Pt states she has severe joint and body aches.   Codeine Hives    rash    HOME MEDICATIONS: Outpatient Medications Prior to Visit  Medication Sig Dispense Refill   Ascorbic Acid (SUPER C COMPLEX PO) Take 1 tablet by mouth daily.     aspirin EC 325 MG tablet Take 162.5 mg by mouth daily.     cyanocobalamin (VITAMIN B12) 1000 MCG tablet Take 1,000 mcg by mouth daily.     ibuprofen (ADVIL,MOTRIN) 200 MG tablet Take 400 mg by mouth every 6 (six) hours as needed for mild pain.     Magnesium 400 MG TABS Take 800 mg by mouth daily.     metoprolol succinate (TOPROL-XL) 25 MG 24 hr tablet Take 1 tablet by mouth once daily 90 tablet 0   OZEMPIC, 1 MG/DOSE, 4  MG/3ML SOPN Inject 1 mg into the skin once a week.     pyridOXINE  (VITAMIN B6) 100 MG tablet Take 100 mg by mouth daily.     SUMAtriptan (IMITREX) 100 MG tablet Take 1 tab at onset of migraine.  May repeat in 2 hrs, if needed.  Max dose: 2 tabs/day. This is a 90 day prescription. 27 tablet 4   SUMAtriptan (IMITREX) 6 MG/0.5ML SOLN injection Inject 0.5 mLs (6 mg total) into the skin daily as needed for migraine. 2 mL 5   venlafaxine XR (EFFEXOR-XR) 75 MG 24 hr capsule Take 1 capsule (75 mg total) by mouth at bedtime. 90 capsule 0   Vitamin D, Ergocalciferol, (DRISDOL) 1.25 MG (50000 UNIT) CAPS capsule Take 50,000 Units by mouth once a week.     No facility-administered medications prior to visit.    PAST MEDICAL HISTORY: Past Medical History:  Diagnosis Date   Allergy    Anxiety    Atypical chest pain 08/24/2011   Blood clot in vein    right leg   Chest tightness    Chronic migraine 10/19/2017   Chronic venous insufficiency 05/23/2013   [name], MD Continue all current conservative treatment. 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 Seeking approval for this treatment and will proceed as soon as is approval is obtained.    Depression    Diabetes (HCC) 07/17/2014   Factor 5 Leiden mutation, heterozygous (HCC) 07/17/2014   Factor V deficiency (HCC)    Family history of premature coronary artery disease 08/24/2011   History of adverse effect of venous thromboembolism (VTE) prophylaxis 02/04/2019   History of COVID-19 02/14/2019   Hypercholesterolemia 08/24/2011   Hyperlipidemia    Migraine    Migraine, chronic, without aura, intractable 09/13/2012   Botox approved diagnosis.    Morbid obesity (HCC) 09/07/2018   Morbid obesity with BMI of 40.0-44.9, adult (HCC)    Neuralgia 07/30/2015   Paroxysmal SVT (supraventricular tachycardia) 02/04/2019   Pneumonia due to COVID-19 virus 02/04/2019   Prediabetes 09/06/2022   Sleep apnea 10/19/2017   Trochanteric bursitis of  left hip 11/12/2019   Trochanteric bursitis of right hip 11/12/2019   Type 2 diabetes mellitus without complication, without long-term current use of insulin (HCC) 09/08/2018   Venous thrombosis 12/10/2013   Occurred after treatment for varicose veins, she is on xarelto    Vitamin D deficiency 07/17/2014    PAST SURGICAL HISTORY: Past Surgical History:  Procedure Laterality Date   TUBAL LIGATION  2006   VEIN LIGATION Bilateral     FAMILY HISTORY: Family History  Problem Relation Age of Onset   Diabetes Mother 29       type 2   Diabetes Father 56       type 2   Dementia Father 42   Hyperlipidemia Father    Diabetes Sister    Hyperlipidemia Sister    Hypertension Sister    Diabetes Brother    Heart disease Brother    Hyperlipidemia Brother    Hypertension Brother    Breast cancer Sister     SOCIAL HISTORY: Social History   Socioeconomic History   Marital status: Divorced    Spouse name: Not on file   Number of children: 0   Years of education: 12   Highest education level: Not on file  Occupational History   Occupation: Estate agent    Employer: [employer]  Tobacco Use   Smoking  status: Never   Smokeless tobacco: Never  Vaping Use   Vaping status: Never Used  Substance and Sexual Activity   Alcohol use: Yes    Alcohol/week: 1.0 standard drink of alcohol    Types: 1 Glasses of wine per week    Comment: OCC   Drug use: No   Sexual activity: Not on file  Other Topics Concern   Not on file  Social History Narrative   Patient is divorced. Patient has a high school education and works for [employer].    Right handed.   Caffeine- two daily            Social Determinants of Health   Financial Resource Strain: Not on file  Food Insecurity: No Food Insecurity (12/25/2020)   Received from [facility], [facility]   Hunger Vital Sign    Worried About Running Out of Food in the Last Year: Never true    Ran Out of Food in the Last  Year: Never true  Transportation Needs: Not on file  Physical Activity: Not on file  Stress: Not on file  Social Connections: Unknown (05/27/2021)   [facility] from [facility], [facility]   Social Network    Social Network: Not on file  Intimate Partner Violence: Unknown (04/22/2021)   Received from [facility], [facility]   HITS    Physically Hurt: Not on file    Insult or Talk Down To: Not on file    Threaten Physical Harm: Not on file    Scream or Curse: Not on file      PHYSICAL EXAM  There were no vitals filed for this visit.    There is no height or weight on file to calculate BMI.  Generalized: Well developed, in no acute distress  Cardiology: normal rate and rhythm, no murmur noted Respiratory: clear to auscultation bilaterally  Neurological examination  Mentation: Alert oriented to time, place, history taking. Follows all commands speech and language fluent Cranial nerve II-XII: Pupils were equal round reactive to light. Extraocular movements were full, visual field were full  Motor: The motor testing reveals 5 over 5 strength of all 4 extremities. Good symmetric motor tone is noted throughout.  Gait and station: Gait is normal.   DIAGNOSTIC DATA (LABS, IMAGING, TESTING) - I reviewed patient records, labs, notes, testing and imaging myself where available.      No data to display           Lab Results  Component Value Date   WBC 6.9 02/08/2019   HGB 12.2 02/08/2019   HCT 38.0 02/08/2019   MCV 85.4 02/08/2019   PLT 319 02/08/2019      Component Value Date/Time   NA 136 02/08/2019 0315   NA 141 01/13/2015 0853   K 5.0 02/08/2019 0315   K 4.3 01/13/2015 0853   CL 106 02/08/2019 0315   CO2 20 (L) 02/08/2019 0315   CO2 25 01/13/2015 0853   GLUCOSE 273 (H) 02/08/2019 0315   GLUCOSE 130 01/13/2015 0853   BUN 28 (H) 02/08/2019 0315   BUN 14.3 01/13/2015 0853   CREATININE 0.83 02/08/2019 0315   CREATININE 0.9 01/13/2015 0853   CALCIUM 8.6 (L)  02/08/2019 0315   CALCIUM 9.5 01/13/2015 0853   PROT 5.8 (L) 02/08/2019 0315   PROT 7.4 01/13/2015 0853   ALBUMIN 2.6 (L) 02/08/2019 0315   ALBUMIN 3.7 01/13/2015 0853   AST 17 02/08/2019 0315   AST 12 01/13/2015 0853   ALT  18 02/08/2019 0315   ALT 14 01/13/2015 0853   ALKPHOS 59 02/08/2019 0315   ALKPHOS 102 01/13/2015 0853   BILITOT 0.5 02/08/2019 0315   BILITOT 0.48 01/13/2015 0853   GFRNONAA >60 02/08/2019 0315   GFRNONAA 83 12/25/2012 0814   GFRAA >60 02/08/2019 0315   GFRAA >89 12/25/2012 0814   Lab Results  Component Value Date   CHOL 217 (H) 12/25/2012   HDL 52 12/25/2012   LDLCALC 140 (H) 12/25/2012   TRIG 126 12/25/2012   CHOLHDL 4.2 12/25/2012   Lab Results  Component Value Date   HGBA1C 7.5 (H) 02/06/2019   Lab Results  Component Value Date   VITAMINB12 440 01/13/2015   Lab Results  Component Value Date   TSH 3.140 12/25/2012       ASSESSMENT AND PLAN 62 y.o. year old female  has a past medical history of Allergy, Anxiety, Atypical chest pain (08/24/2011), Blood clot in vein, Chest tightness, Chronic migraine (10/19/2017), Chronic venous insufficiency (05/23/2013), Depression, Diabetes (HCC) (07/17/2014), Factor 5 Leiden mutation, heterozygous (HCC) (07/17/2014), Factor V deficiency (HCC), Family history of premature coronary artery disease (08/24/2011), History of adverse effect of venous thromboembolism (VTE) prophylaxis (02/04/2019), History of COVID-19 (02/14/2019), Hypercholesterolemia (08/24/2011), Hyperlipidemia, Migraine, Migraine, chronic, without aura, intractable (09/13/2012), Morbid obesity (HCC) (09/07/2018), Morbid obesity with BMI of 40.0-44.9, adult (HCC), Neuralgia (07/30/2015), Paroxysmal SVT (supraventricular tachycardia) (02/04/2019), Pneumonia due to COVID-19 virus (02/04/2019), Prediabetes (09/06/2022), Sleep apnea (10/19/2017), Trochanteric bursitis of left hip (11/12/2019), Trochanteric bursitis of right hip (11/12/2019), Type 2 diabetes  mellitus without complication, without long-term current use of insulin (HCC) (09/08/2018), Venous thrombosis (12/10/2013), and Vitamin D deficiency (07/17/2014). here with   No diagnosis found.    Trinh is doing well on CPAP therapy.  Migraines are usually well managed. She will continue venlafaxine 75 mg every day and sumatriptan as needed. We have discussed appropriate dosing and not to exceed 9 total does each month. Compliance report reveals excellent compliance.  She was encouraged to continue using CPAP nightly and for greater than 4 hours each night. She will continue to monitor for leak at home. I have encouraged her to focus on healthy lifestyle habits.  Adequate hydration, well-balanced diet and sleep hygiene reviewed.  She will continue close follow-up with her primary care provider.  She will follow-up with [place] in 1 year. She verbalizes understanding and agreement with this plan.   No orders of the defined types were placed in this encounter.     No orders of the defined types were placed in this encounter.     Shawnie Dapper, FNP-C 09/24/2022, 11:47 AM [facility] [address] [place], [place] [address] [phone]

## 2022-09-24 NOTE — Patient Instructions (Incomplete)
Please continue using your CPAP regularly. While your insurance requires that you use CPAP at least 4 hours each night on 70% of the nights, I recommend, that you not skip any nights and use it throughout the night if you can. Getting used to CPAP and staying with the treatment long term does take time and patience and discipline. Untreated obstructive sleep apnea when it is moderate to severe can have an adverse impact on cardiovascular health and raise her risk for heart disease, arrhythmias, hypertension, congestive heart failure, stroke and diabetes. Untreated obstructive sleep apnea causes sleep disruption, nonrestorative sleep, and sleep deprivation. This can have an impact on your day to day functioning and cause daytime sleepiness and impairment of cognitive function, memory loss, mood disturbance, and problems focussing. Using CPAP regularly can improve these symptoms.  We will update supply orders, today. Continue venlafaxine 75mg  daily and sumatriptan tablets/injections as needed. Do not exceed two doses of sumatriptan in 24 hours or 9-10 doses per month.   Follow up in 1 year   GENERAL HEADACHE INFORMATION:   Natural supplements: Magnesium Oxide or Magnesium Glycinate 500 mg at bed (up to 800 mg daily) Coenzyme Q10 300 mg in AM Vitamin B2- 200 mg twice a day   Add 1 supplement at a time since even natural supplements can have undesirable side effects. You can sometimes buy supplements cheaper (especially Coenzyme Q10) at www.WebmailGuide.co.za or at [facility].  Migraine with aura: There is increased risk for stroke in women with migraine with aura and a contraindication for the combined contraceptive pill for use by women who have migraine with aura. The risk for women with migraine without aura is lower. However other risk factors like smoking are far more likely to increase stroke risk than migraine. There is a recommendation for no smoking and for the use of OCPs without estrogen such as  progestogen only pills particularly for women with migraine with aura.Marland Kitchen People who have migraine headaches with auras may be 3 times more likely to have a stroke caused by a blood clot, compared to migraine patients who don't see auras. Women who take hormone-replacement therapy may be 30 percent more likely to suffer a clot-based stroke than women not taking medication containing estrogen. Other risk factors like smoking and high blood pressure may be  much more important.    Vitamins and herbs that show potential:   Magnesium: Magnesium (250 mg twice a day or 500 mg at bed) has a relaxant effect on smooth muscles such as blood vessels. Individuals suffering from frequent or daily headache usually have low magnesium levels which can be increase with daily supplementation of 400-750 mg. Three trials found 40-90% average headache reduction  when used as a preventative. Magnesium may help with headaches are aura, the best evidence for magnesium is for migraine with aura is its thought to stop the cortical spreading depression we believe is the pathophysiology of migraine aura.Magnesium also demonstrated the benefit in menstrually related migraine.  Magnesium is part of the messenger system in the serotonin cascade and it is a good muscle relaxant.  It is also useful for constipation which can be a side effect of other medications used to treat migraine. Good sources include nuts, whole grains, and tomatoes. Side Effects: loose stool/diarrhea  Riboflavin (vitamin B 2) 200 mg twice a day. This vitamin assists nerve cells in the production of ATP a principal energy storing molecule.  It is necessary for many chemical reactions in the body.  There have  been at least 3 clinical trials of riboflavin using 400 mg per day all of which suggested that migraine frequency can be decreased.  All 3 trials showed significant improvement in over half of migraine sufferers.  The supplement is found in bread, cereal, milk, meat,  and poultry.  Most Americans get more riboflavin than the recommended daily allowance, however riboflavin deficiency is not necessary for the supplements to help prevent headache. Side effects: energizing, green urine   Coenzyme Q10: This is present in almost all cells in the body and is critical component for the conversion of energy.  Recent studies have shown that a nutritional supplement of CoQ10 can reduce the frequency of migraine attacks by improving the energy production of cells as with riboflavin.  Doses of 150 mg twice a day have been shown to be effective.   Melatonin: Increasing evidence shows correlation between melatonin secretion and headache conditions.  Melatonin supplementation has decreased headache intensity and duration.  It is widely used as a sleep aid.  Sleep is natures way of dealing with migraine.  A dose of 3 mg is recommended to start for headaches including cluster headache. Higher doses up to 15 mg has been reviewed for use in Cluster headache and have been used. The rationale behind using melatonin for cluster is that many theories regarding the cause of Cluster headache center around the disruption of the normal circadian rhythm in the brain.  This helps restore the normal circadian rhythm.   HEADACHE DIET: Foods and beverages which may trigger migraine Note that only 20% of headache patients are food sensitive. You will know if you are food sensitive if you get a headache consistently 20 minutes to 2 hours after eating a certain food. Only cut out a food if it causes headaches, otherwise you might remove foods you enjoy! What matters most for diet is to eat a well balanced healthy diet full of vegetables and low fat protein, and to not miss meals.   Chocolate, other sweets ALL cheeses except cottage and cream cheese Dairy products, yogurt, sour cream, ice cream Liver Meat extracts (Bovril, Marmite, meat tenderizers) Meats or fish which have undergone aging,  fermenting, pickling or smoking. These include: Hotdogs,salami,Lox,sausage, mortadellas,smoked salmon, pepperoni, Pickled herring Pods of broad bean (English beans, Chinese pea pods, [place] (fava) beans, lima and navy beans Ripe avocado, ripe banana Yeast extracts or active yeast preparations such as Brewer's or Fleishman's (commercial bakes goods are permitted) Tomato based foods, pizza (lasagna, etc.)   MSG (monosodium glutamate) is disguised as many things; look for these common aliases: Monopotassium glutamate Autolysed yeast Hydrolysed protein Sodium caseinate "flavorings" "all natural preservatives" Nutrasweet   Avoid all other foods that convincingly provoke headaches.   Resources: The Dizzy [place] Laundry Your Headache Diet, migrainestrong.com  https://zamora-andrews.com/   Caffeine and Migraine For patients that have migraine, caffeine intake more than 3 days per week can lead to dependency and increased migraine frequency. I would recommend cutting back on your caffeine intake as best you can. The recommended amount of caffeine is 200-300 mg daily, although migraine patients may experience dependency at even lower doses. While you may notice an increase in headache temporarily, cutting back will be helpful for headaches in the long run. For more information on caffeine and migraine, visit: https://americanmigrainefoundation.org/resource-library/caffeine-and-migraine/   Headache Prevention Strategies:   1. Maintain a headache diary; learn to identify and avoid triggers.  - This can be a simple note where you log when you had a headache, associated  symptoms, and medications used - There are several smartphone apps developed to help track migraines: Migraine Buddy, Migraine Monitor, Curelator N1-Headache App   Common triggers include: Emotional triggers: Emotional/Upset family or friends Emotional/Upset occupation Business  reversal/success Anticipation anxiety Crisis-serious Post-crisis periodNew job/position   Physical triggers: Vacation Day Weekend Strenuous Exercise High Altitude Location New Move Menstrual Day Physical Illness Oversleep/Not enough sleep Weather changes Light: Photophobia or light sesnitivity treatment involves a balance between desensitization and reduction in overly strong input. Use dark polarized glasses outside, but not inside. Avoid bright or fluorescent light, but do not dim environment to the point that going into a normally lit room hurts. Consider FL-41 tint lenses, which reduce the most irritating wavelengths without blocking too much light.  These can be obtained at axonoptics.com or theraspecs.com Foods: see list above.   2. Limit use of acute treatments (over-the-counter medications, triptans, etc.) to no more than 2 days per week or 10 days per month to prevent medication overuse headache (rebound headache).     3. Follow a regular schedule (including weekends and holidays): Don't skip meals. Eat a balanced diet. 8 hours of sleep nightly. Minimize stress. Exercise 30 minutes per day. Being overweight is associated with a 5 times increased risk of chronic migraine. Keep well hydrated and drink 6-8 glasses of water per day.   4. Initiate non-pharmacologic measures at the earliest onset of your headache. Rest and quiet environment. Relax and reduce stress. Breathe2Relax is a free app that can instruct you on    some simple relaxtion and breathing techniques. Http://Dawnbuse.com is a    free website that provides teaching videos on relaxation.  Also, there are  many apps that   can be downloaded for "mindful" relaxation.  An app called YOGA NIDRA will help walk you through mindfulness. Another app called Calm can be downloaded to give you a structured mindfulness guide with daily reminders and skill development. Headspace for guided meditation Mindfulness Based Stress  Reduction Online Course: www.palousemindfulness.com Cold compresses.   5. Don't wait!! Take the maximum allowable dosage of prescribed medication at the first sign of migraine.   6. Compliance:  Take prescribed medication regularly as directed and at the first sign of a migraine.   7. Communicate:  Call your physician when problems arise, especially if your headaches change, increase in frequency/severity, or become associated with neurological symptoms (weakness, numbness, slurred speech, etc.). Proceed to emergency room if you experience new or worsening symptoms or symptoms do not resolve, if you have new neurologic symptoms or if headache is severe, or for any concerning symptom.   8. Headache/pain management therapies: Consider various complementary methods, including medication, behavioral therapy, psychological counselling, biofeedback, massage therapy, acupuncture, dry needling, and other modalities.  Such measures may reduce the need for medications. Counseling for pain management, where patients learn to function and ignore/minimize their pain, seems to work very well.   9. Recommend changing family's attention and focus away from patient's headaches. Instead, emphasize daily activities. If first question of day is 'How are your headaches/Do you have a headache today?', then patient will constantly think about headaches, thus making them worse. Goal is to re-direct attention away from headaches, toward daily activities and other distractions.   10. Helpful Websites: www.AmericanHeadacheSociety.org PatentHood.ch www.headaches.org TightMarket.nl www.achenet.org

## 2022-09-27 ENCOUNTER — Ambulatory Visit (INDEPENDENT_AMBULATORY_CARE_PROVIDER_SITE_OTHER): Payer: No Typology Code available for payment source | Admitting: Family Medicine

## 2022-09-27 ENCOUNTER — Encounter: Payer: Self-pay | Admitting: Family Medicine

## 2022-09-27 VITALS — BP 130/77 | HR 70 | Ht 65.0 in | Wt 224.4 lb

## 2022-09-27 DIAGNOSIS — G4733 Obstructive sleep apnea (adult) (pediatric): Secondary | ICD-10-CM | POA: Diagnosis not present

## 2022-09-27 DIAGNOSIS — G43719 Chronic migraine without aura, intractable, without status migrainosus: Secondary | ICD-10-CM

## 2022-09-27 MED ORDER — SUMATRIPTAN SUCCINATE 100 MG PO TABS
ORAL_TABLET | ORAL | 3 refills | Status: DC
Start: 2022-09-27 — End: 2023-10-02

## 2022-09-27 MED ORDER — VENLAFAXINE HCL ER 75 MG PO CP24
75.0000 mg | ORAL_CAPSULE | Freq: Every day | ORAL | 3 refills | Status: DC
Start: 2022-09-27 — End: 2023-10-02

## 2022-09-27 MED ORDER — SUMATRIPTAN SUCCINATE 6 MG/0.5ML ~~LOC~~ SOLN: 6.0000 mg | Freq: Every day | SUBCUTANEOUS | 5 refills | Status: DC | PRN

## 2022-12-03 ENCOUNTER — Other Ambulatory Visit: Payer: Self-pay | Admitting: Cardiology

## 2023-09-19 ENCOUNTER — Other Ambulatory Visit: Payer: Self-pay

## 2023-09-21 ENCOUNTER — Other Ambulatory Visit: Payer: Self-pay

## 2023-10-02 NOTE — Progress Notes (Unsigned)
 SABRA

## 2023-10-02 NOTE — Progress Notes (Unsigned)
 PATIENT: [name] [facility] DOB: 1960/03/31  REASON FOR VISIT: follow up HISTORY FROM: patient  No chief complaint on file.   Dr Onita: headaches Dr Buck: sleep    HISTORY OF PRESENT ILLNESS:  10/02/23 ALL: [name] returns for follow up for OSA on CPAP and migraines.   She continues venlafaxine  75mg  daily and sumatriptan  as needed. Sumatriptan  injections?   She is doing well on therapy. She sleeps well. She does note an air leak but not bothered by it. She is using a dream wear style FFM.     09/27/2022 ALL:  [name] returns for follow up for OSA on CPAP and migraines. She reports doing well. She feels migraines are well managed. She may have 4-5 migraines per month with 1-2 being intractable. Sumatriptan  works well for abortive therapy.   She continues venlafaxine  75mg  daily and sumatriptan  as needed.  She usually takes sumatriptan  tablets about 5 times a month.  She uses sumatriptan  injections about 1-2 a month for intractable headaches.   She is doing well on therapy. She sleeps well. She does note an air leak but not bothered by it. She is using a dream wear style FFM.     09/15/2021 ALL: [name] returns for follow up for OSA on CPAP and migraines. She continues to do well on therapy. She is using CPAP every night. She does note improved sleep quality when using CPAP. She may not change out her mask as often as she should and has noted an air leak.   She continues venlafaxine  75mg  QHS and sumatriptan  as needed. [name] worked well but not covered when she lost her insurance. She feels headaches are well managed. She may have a few more if she has difficulty sleeping. She may have 3-6 headaches a month. Sumatriptan  works well for abortive therapy. She uses sumatriptan  injections 2-3 times a month and 2-3 tablets a month.     09/14/2020 ALL: [name] returns for follow up for OSA on CPAP and migraines. She continues venlafaxine  75mg  at bedtime and sumatriptan  as needed. Oral usually  helps but may use injections for intractable migraines. For the past 8 weeks, she feels that she has had a headache every weekend. She seems to have a headache when she wakes on Saturday. She lost her oral sumatriptan  and has used injections that seem to help. She is not able to identify specific triggers.   She is doing well with CPAP therapy. She does note worsening leak, recently, and feels it is related to needing a new mask. She will continue to monitor at home.     09/11/2019 ALL:  [name] is a 63 y.o. female here today for follow up for migraines and OSA on CPAP. She is doing well. Migraines are well managed with sumatriptan  for abortive therapy. She is also taking venlafaxine  75mg  at bedtime. She is using CPAP nightly. She continues to note a leak with her Dreamwear FFM. She has days where she feels really tired. She feels this is related to her sleep patterns. She is working on getting to bed around 9pm consistently. She wakes around 5.   Compliance report dated 08/11/2019 through 09/09/2019 reveals that she used CPAP 30 of the past 30 days for compliance of 100%.  She used CPAP greater than 4 hours 29 of the last 30 days for compliance of 97%.  Average usage was 6 hours and 42 minutes.  Residual AHI was 0.8 on 7 to 13 cm of water and an  EPR of 1.  There was a leak noted in the 95th percentile of 32.2.  HISTORY: (copied from my note on 03/14/2019)  [name] is a 63 y.o. female here today for follow up of OSA on CPAP and migraines. She is doing very well.  She reports near daily compliance with CPAP therapy.  She denies any difficulty with the machine with the exception of noting a leak.  She is using a DreamWear full facemask.  Over the past few weeks she has noted that the air is leaking around the mask.  She replaces her supplies regularly.  She has not reached out to the DME company.  She does feel that fatigue levels have significantly improved.  She is recently recovering from  Covid pneumonia.  She was hospitalized for 5 days in January.  She reports that at baseline.   Migraines are well managed.  She continues Effexor  for preventative care.  She has approximately 2-3 migraines per month.  She is using Imitrex  as needed for abortive therapy.  She has both tablet and injection forms of Imitrex .  Both work very well for abortive therapy.  She does feel that Imitrex  tablets make her have more joint stiffness for about 24 hours.  She does not feel that this is worrisome.  No other side effects.  No chest pain, trouble breathing or dizziness.   Compliance report dated 02/12/2019 through 03/13/2019 reveals that she used CPAP 27 of the last 30 days for compliance of 90%.  She used CPAP greater than 4 hours 26 of 30 days for compliance of 87%.  Average usage was 6 hours and 48 minutes.  Residual AHI was 0.2 on 7 to 13 cm of water and an EPR of 1.  There was a significant leak noted in the 95th percentile of 29.3 L/min.   HISTORY: (copied from my note on 09/11/2018)   [name] is a 63 y.o. female here today for follow up OSA on CPAP. She continues to have concerns of excessive daytime sleepiness and fatigue. She is compliant with therapy. She feels that she was feeling better until about 2 weeks ago. She states that she has fallen asleep twice at work. She has never done this before. She reports that once, she was dreaming. She is caring for a dog with seizures. She is up and down throughout the night. She continues Effexor  75mg  for migraine prevention. She is taking magnesium with Melatonin at night as well. She does not exercise. She was recently started on metformin 1000mg  twice daily. 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 She has gained about 30 pounds in the past year. She is working with [name] with [facility] to help with weight management. She is also caring for her elderly mother.    Compliance report dated 08/12/2018 through 09/10/2018  reveals that she is using CPAP nightly and greater than 4 hours each night for compliance of 100%.  Average usage is 6 hours and 56 minutes.  AHI was 0.5 on 7 to 13 cm of water and an EPR of 1.  There was no significant leak.     HISTORY: (copied from Dr Obie note on 03/26/2018)   Interim history:    Ms. Repetto is a 63 year old right-handed woman with an underlying medical history of migraine headaches, hyperlipidemia, factor V deficiency, depression, anxiety, history of DVT, history of varicose veins, and obesity, who presents for follow-up consultation of her obstructive sleep  apnea, after a recent onset sleep testing and starting AutoPap therapy. The patient is unaccompanied today. I first met her on 01/22/2018 at the request of Dr. Onita, at which time she reported snoring and daytime somnolence as well as witnessed apneas. Her Epworth sleepiness score was 17 at the time.   Today, 03/26/2018: I reviewed her AutoPap compliance data from 02/24/2018 through 03/25/2018 which is a total of 30 days, during which time she used her AutoPap every night with percent used days greater than 4 hours at 100%, indicating superb compliance with an average usage of 6 hours and 46 minutes, residual AHI at goal at 0.5 per hour, 95th percentile pressure at 11.7 cm, leak on the high side with the 95th percentile at 19.8 L/m on a pressure range of 7 cm to 13 cm. She reports that she is still sleepy. She even feels that she is more sleepy as she has started using the AutoPap. Her Epworth sleepiness score is 19 out of 24 today. Looking back at her medication history, she restarted Effexor  long-acting in September 2019, she had stopped it in or around May 2018. When she saw Dr. Onita in October 2019 the Effexor  was increased to 75 mg daily. She takes it at night. She does admit that it could be correlated. She also has a dog that has to be up early, she tries to keep a scheduled for this. She tries to keep a schedule on the  weekends 2. She does report that her headaches are better all month of February since she has been consistent with her AutoPap. In fact, her headaches are much improved. Previously, she had been on Topamax  which caused side effects and she also tried talks from migraines. She is motivated to continue with treatment. She has gained weight over time. She has a more sedentary job for the past year.     The patient's allergies, current medications, family history, past medical history, past social history, past surgical history and problem list were reviewed and updated as appropriate.    Previously:    01/22/2018: She reports snoring and excessive daytime somnolence. I reviewed your office note from 10/19/2017. She has had recurrent headaches. Her Epworth sleepiness score is 17 out of 24, fatigue score is 41 out of 63. She is divorced and lives alone, no children. She is a nonsmoker and drinks alcohol infrequently, caffeine not daily, 1-2 cups 3 or 4 times a week on average. She has a 40 minute commute and has become sleepy while driving. She works as an Administrator, [employer]. She tries to be in bed before 10 and arise time is currently around 5. She has a dog that needs to get out around that time. She has a TV in her bedroom, typically on a sleep timer, she has had some morning headaches. She has nocturia about once per average night, is not aware of any family history of OSA. She gained weight starting in 2006. Her sister has noted her loud snoring but also pauses in her breathing and patient has woken herself up with a sense of gasping for air at times.    REVIEW OF SYSTEMS: Out of a complete 14 system review of symptoms, the patient complains only of the following symptoms, headaches, fatigue, joint and muscle aches and all other reviewed systems are negative.  ESS 6/24  ALLERGIES: Allergies  Allergen Reactions   Statins Other (See Comments)    Pt states she has severe joint and body aches.  Codeine Hives    rash    HOME MEDICATIONS: Outpatient Medications Prior to Visit  Medication Sig Dispense Refill   Ascorbic Acid  (SUPER C COMPLEX PO) Take 1 tablet by mouth daily.     aspirin  EC 325 MG tablet Take 162.5 mg by mouth daily.     cyanocobalamin (VITAMIN B12) 1000 MCG tablet Take 1,000 mcg by mouth daily.     ibuprofen (ADVIL,MOTRIN) 200 MG tablet Take 400 mg by mouth every 6 (six) hours as needed for mild pain.     Magnesium 400 MG TABS Take 400 mg by mouth daily.     metoprolol  succinate (TOPROL -XL) 25 MG 24 hr tablet Take 1 tablet by mouth once daily 30 tablet 0   OZEMPIC, 1 MG/DOSE, 4 MG/3ML SOPN Inject 1 mg into the skin once a week.     pyridOXINE (VITAMIN B6) 100 MG tablet Take 100 mg by mouth daily.     SUMAtriptan  (IMITREX ) 100 MG tablet Take 1 tab at onset of migraine.  May repeat in 2 hrs, if needed.  Max dose: 2 tabs/day. This is a 90 day prescription. 27 tablet 3   SUMAtriptan  (IMITREX ) 6 MG/0.5ML SOLN injection Inject 0.5 mLs (6 mg total) into the skin daily as needed for migraine. 2 mL 5   venlafaxine  XR (EFFEXOR -XR) 75 MG 24 hr capsule Take 1 capsule (75 mg total) by mouth at bedtime. 90 capsule 3   Vitamin D , Ergocalciferol , (DRISDOL) 1.25 MG (50000 UNIT) CAPS capsule Take 50,000 Units by mouth once a week.     No facility-administered medications prior to visit.    PAST MEDICAL HISTORY: Past Medical History:  Diagnosis Date   Allergy    Anxiety    Atypical chest pain 08/24/2011   Blood clot in vein    right leg   Chest tightness    Chronic migraine 10/19/2017   Chronic venous insufficiency 05/23/2013   Oneil Millet, MD Continue all current conservative treatment. 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 Seeking approval for this treatment and will proceed as soon as is approval is obtained.    Depression    Diabetes (HCC) 07/17/2014   Factor 5 Leiden mutation, heterozygous  (HCC) 07/17/2014   Factor V deficiency (HCC)    Family history of premature coronary artery disease 08/24/2011   History of adverse effect of venous thromboembolism (VTE) prophylaxis 02/04/2019   History of COVID-19 02/14/2019   Hypercholesterolemia 08/24/2011   Hyperlipidemia    Migraine    Migraine, chronic, without aura, intractable 09/13/2012   Botox approved diagnosis.    Morbid obesity (HCC) 09/07/2018   Morbid obesity with BMI of 40.0-44.9, adult (HCC)    Neuralgia 07/30/2015   Paroxysmal SVT (supraventricular tachycardia) 02/04/2019   Pneumonia due to COVID-19 virus 02/04/2019   Prediabetes 09/06/2022   Sleep apnea 10/19/2017   Trochanteric bursitis of left hip 11/12/2019   Trochanteric bursitis of right hip 11/12/2019   Type 2 diabetes mellitus without complication, without long-term current use of insulin  (HCC) 09/08/2018   Venous thrombosis 12/10/2013   Occurred after treatment for varicose veins, she is on xarelto     Vitamin D  deficiency 07/17/2014    PAST SURGICAL HISTORY: Past Surgical History:  Procedure Laterality Date   TUBAL LIGATION  2006   VEIN LIGATION Bilateral     FAMILY HISTORY: Family History  Problem Relation Age of Onset   Diabetes Mother 13       type 2  Diabetes Father 78       type 2   Dementia Father 64   Hyperlipidemia Father    Diabetes Sister    Hyperlipidemia Sister    Hypertension Sister    Diabetes Brother    Heart disease Brother    Hyperlipidemia Brother    Hypertension Brother    Breast cancer Sister     SOCIAL HISTORY: Social History   Socioeconomic History   Marital status: Divorced    Spouse name: Not on file   Number of children: 0   Years of education: 12   Highest education level: Not on file  Occupational History   Occupation: Engineering geologist: [employer]  Tobacco Use   Smoking status: Never   Smokeless tobacco: Never  Vaping Use   Vaping status: Never Used  Substance and  Sexual Activity   Alcohol use: Yes    Alcohol/week: 1.0 standard drink of alcohol    Types: 1 Glasses of wine per week    Comment: O[facility]   Drug use: No   Sexual activity: Not on file  Other Topics Concern   Not on file  Social History Narrative   Patient is divorced. Patient has a high school education and works for [employer].    Right handed.   Caffeine- two daily            Social Drivers of Corporate investment banker Strain: Not on file  Food Insecurity: No Food Insecurity (12/25/2020)   Received from [facility]   Hunger Vital Sign    Within the past 12 months, you worried that your food would run out before you got the money to buy more.: Never true    Within the past 12 months, the food you bought just didn't last and you didn't have money to get more.: Never true  Transportation Needs: Not on file  Physical Activity: Not on file  Stress: Not on file  Social Connections: Unknown (05/27/2021)   Received from [facility]   Social Network    Social Network: Not on file  Intimate Partner Violence: Unknown (04/22/2021)   Received from [facility]   HITS    Physically Hurt: Not on file    Insult or Talk Down To: Not on file    Threaten Physical Harm: Not on file    Scream or Curse: Not on file      PHYSICAL EXAM  There were no vitals filed for this visit.     There is no height or weight on file to calculate BMI.  Generalized: Well developed, in no acute distress  Cardiology: normal rate and rhythm, no murmur noted Respiratory: clear to auscultation bilaterally  Neurological examination  Mentation: Alert oriented to time, place, history taking. Follows all commands speech and language fluent Cranial nerve II-XII: Pupils were equal round reactive to light. Extraocular movements were full, visual field were full  Motor: The motor testing reveals 5 over 5 strength of all 4 extremities. Good symmetric motor tone is noted throughout.  Gait and station: Gait  is normal.   DIAGNOSTIC DATA (LABS, IMAGING, TESTING) - I reviewed patient records, labs, notes, testing and imaging myself where available.      No data to display           Lab Results  Component Value Date   WBC 6.9 02/08/2019   HGB 12.2 02/08/2019   HCT 38.0 02/08/2019   MCV 85.4 02/08/2019  PLT 319 02/08/2019      Component Value Date/Time   NA 136 02/08/2019 0315   NA 141 01/13/2015 0853   K 5.0 02/08/2019 0315   K 4.3 01/13/2015 0853   CL 106 02/08/2019 0315   CO2 20 (L) 02/08/2019 0315   CO2 25 01/13/2015 0853   GLUCOSE 273 (H) 02/08/2019 0315   GLUCOSE 130 01/13/2015 0853   BUN 28 (H) 02/08/2019 0315   BUN 14.3 01/13/2015 0853   CREATININE 0.83 02/08/2019 0315   CREATININE 0.9 01/13/2015 0853   CALCIUM  8.6 (L) 02/08/2019 0315   CALCIUM  9.5 01/13/2015 0853   PROT 5.8 (L) 02/08/2019 0315   PROT 7.4 01/13/2015 0853   ALBUMIN 2.6 (L) 02/08/2019 0315   ALBUMIN 3.7 01/13/2015 0853   AST 17 02/08/2019 0315   AST 12 01/13/2015 0853   ALT 18 02/08/2019 0315   ALT 14 01/13/2015 0853   ALKPHOS 59 02/08/2019 0315   ALKPHOS 102 01/13/2015 0853   BILITOT 0.5 02/08/2019 0315   BILITOT 0.48 01/13/2015 0853   GFRNONAA >60 02/08/2019 0315   GFRNONAA 83 12/25/2012 0814   GFRAA >60 02/08/2019 0315   GFRAA >89 12/25/2012 0814   Lab Results  Component Value Date   CHOL 217 (H) 12/25/2012   HDL 52 12/25/2012   LDLCALC 140 (H) 12/25/2012   TRIG 126 12/25/2012   CHOLHDL 4.2 12/25/2012   Lab Results  Component Value Date   HGBA1C 7.5 (H) 02/06/2019   Lab Results  Component Value Date   VITAMINB12 440 01/13/2015   Lab Results  Component Value Date   TSH 3.140 12/25/2012       ASSESSMENT AND PLAN 63 y.o. year old female  has a past medical history of Allergy, Anxiety, Atypical chest pain (08/24/2011), Blood clot in vein, Chest tightness, Chronic migraine (10/19/2017), Chronic venous insufficiency (05/23/2013), Depression, Diabetes (HCC) (07/17/2014),  Factor 5 Leiden mutation, heterozygous (HCC) (07/17/2014), Factor V deficiency (HCC), Family history of premature coronary artery disease (08/24/2011), History of adverse effect of venous thromboembolism (VTE) prophylaxis (02/04/2019), History of COVID-19 (02/14/2019), Hypercholesterolemia (08/24/2011), Hyperlipidemia, Migraine, Migraine, chronic, without aura, intractable (09/13/2012), Morbid obesity (HCC) (09/07/2018), Morbid obesity with BMI of 40.0-44.9, adult (HCC), Neuralgia (07/30/2015), Paroxysmal SVT (supraventricular tachycardia) (02/04/2019), Pneumonia due to COVID-19 virus (02/04/2019), Prediabetes (09/06/2022), Sleep apnea (10/19/2017), Trochanteric bursitis of left hip (11/12/2019), Trochanteric bursitis of right hip (11/12/2019), Type 2 diabetes mellitus without complication, without long-term current use of insulin  (HCC) (09/08/2018), Venous thrombosis (12/10/2013), and Vitamin D  deficiency (07/17/2014). here with   No diagnosis found.    Akilah is doing well on CPAP therapy.  Migraines are usually well managed. She will continue venlafaxine  75 mg every day and sumatriptan  as needed. We have discussed appropriate dosing and not to exceed 9 total does each month. Compliance report reveals excellent compliance.  She was encouraged to continue using CPAP nightly and for greater than 4 hours each night. She will continue to monitor for leak at home. I have encouraged her to focus on healthy lifestyle habits.  Adequate hydration, well-balanced diet and sleep hygiene reviewed.  She will continue close follow-up with her primary care provider.  She will follow-up with us  in 1 year. She verbalizes understanding and agreement with this plan.   No orders of the defined types were placed in this encounter.     No orders of the defined types were placed in this encounter.    I personally spent a total of *** minutes in the care of  the patient today including {Time Based Coding:[id number]}.     Greig Forbes, FNP-C 10/02/2023, 8:32 AM [facility] [address], [address] [place], [place] [address] [phone]

## 2023-10-02 NOTE — Patient Instructions (Signed)
 Please continue using your CPAP regularly. While your insurance requires that you use CPAP at least 4 hours each night on 70% of the nights, I recommend, that you not skip any nights and use it throughout the night if you can. Getting used to CPAP and staying with the treatment long term does take time and patience and discipline. Untreated obstructive sleep apnea when it is moderate to severe can have an adverse impact on cardiovascular health and raise her risk for heart disease, arrhythmias, hypertension, congestive heart failure, stroke and diabetes. Untreated obstructive sleep apnea causes sleep disruption, nonrestorative sleep, and sleep deprivation. This can have an impact on your day to day functioning and cause daytime sleepiness and impairment of cognitive function, memory loss, mood disturbance, and problems focussing. Using CPAP regularly can improve these symptoms.  We will update supply orders, today. You are eligible for a new machine. I will order a repeat sleep study that you will do at home. Please listen out for a call from our sleep lab staff to schedule. Once you complete study, our sleep doctors with evaluate the data and we will use that to order a new machine as indicated. This process could take a few weeks. Once new machine is ordered, you will hear back from your DME company to schedule set up of your new machine.   We will need to see you back within 31-90 days following set up of your new CPAP to document compliance as required by your insurance company. Please call the office to schedule follow up when you receive your new machine. Please feel free to reach out with any questions or concerns.    Continue venlafaxine  75mg  daily and sumatriptan  as needed for migraines management.

## 2023-10-03 ENCOUNTER — Encounter: Payer: Self-pay | Admitting: Family Medicine

## 2023-10-03 ENCOUNTER — Ambulatory Visit (INDEPENDENT_AMBULATORY_CARE_PROVIDER_SITE_OTHER): Payer: BC Managed Care – PPO | Admitting: Family Medicine

## 2023-10-03 ENCOUNTER — Telehealth: Payer: Self-pay | Admitting: Family Medicine

## 2023-10-03 VITALS — BP 132/81 | HR 73 | Ht 65.0 in | Wt 212.0 lb

## 2023-10-03 DIAGNOSIS — G43719 Chronic migraine without aura, intractable, without status migrainosus: Secondary | ICD-10-CM

## 2023-10-03 DIAGNOSIS — G4733 Obstructive sleep apnea (adult) (pediatric): Secondary | ICD-10-CM | POA: Diagnosis not present

## 2023-10-03 MED ORDER — SUMATRIPTAN SUCCINATE 6 MG/0.5ML ~~LOC~~ SOLN: 6.0000 mg | Freq: Every day | SUBCUTANEOUS | 5 refills | Status: DC | PRN

## 2023-10-03 MED ORDER — VENLAFAXINE HCL ER 75 MG PO CP24: 75.0000 mg | ORAL_CAPSULE | Freq: Every day | ORAL | 3 refills | Status: AC

## 2023-10-03 MED ORDER — SUMATRIPTAN SUCCINATE 100 MG PO TABS
ORAL_TABLET | ORAL | 3 refills | Status: AC
Start: 2023-10-03 — End: ?

## 2023-10-03 NOTE — Telephone Encounter (Signed)
 HST [employer] health pending

## 2023-10-18 NOTE — Telephone Encounter (Signed)
 Still pending

## 2023-10-31 NOTE — Telephone Encounter (Signed)
 [employer] denied the HST because she is compliant and there is no significant issues. It appears an order can be placed for a new machine.

## 2023-10-31 NOTE — Addendum Note (Signed)
 Addended byBETHA FORBES, [place] Fant L on: 10/31/2023 11:40 AM   Modules accepted: Orders

## 2023-10-31 NOTE — Telephone Encounter (Signed)
 Amy- did you want to go ahead and place order?

## 2023-11-14 ENCOUNTER — Other Ambulatory Visit: Payer: Self-pay

## 2023-12-16 ENCOUNTER — Other Ambulatory Visit: Payer: Self-pay

## 2023-12-31 ENCOUNTER — Other Ambulatory Visit: Payer: Self-pay

## 2024-01-23 ENCOUNTER — Telehealth: Payer: Self-pay | Admitting: Family Medicine

## 2024-01-23 NOTE — Telephone Encounter (Signed)
 Pt would like to know could she restart the process of getting CPAP Machine and who will she need to speak to ?

## 2024-01-23 NOTE — Telephone Encounter (Signed)
 There was already a new order placed October 2025 for a new machine. Let's check with DME to be sure they have it and will process it.

## 2024-01-23 NOTE — Telephone Encounter (Signed)
" °  Last machine issued 02/23/2018.  "

## 2024-01-24 NOTE — Telephone Encounter (Signed)
 I have sent a community message to pt's DME to check on order

## 2024-01-25 NOTE — Telephone Encounter (Signed)
 LVM letting pt know that her CPAP order has been placed with her DME company.

## 2024-01-30 ENCOUNTER — Other Ambulatory Visit: Payer: Self-pay

## 2024-01-30 NOTE — Telephone Encounter (Signed)
 [name]

## 2024-02-05 ENCOUNTER — Ambulatory Visit

## 2024-02-05 VITALS — BP 124/84 | HR 93 | Ht 65.0 in | Wt 206.8 lb

## 2024-02-05 DIAGNOSIS — E7800 Pure hypercholesterolemia, unspecified: Secondary | ICD-10-CM | POA: Diagnosis not present

## 2024-02-05 DIAGNOSIS — I471 Supraventricular tachycardia, unspecified: Secondary | ICD-10-CM

## 2024-02-05 NOTE — Progress Notes (Signed)
 "  Cardiology Consultation:    Date:  02/05/2024   ID:  [name], DOB 04/01/1960, MRN [id number]  PCP:  Starla Stapler, FNP  Cardiologist:  Alean JONELLE Kobus, MD   Referring MD: Starla Stapler, FNP   No chief complaint on file.    ASSESSMENT AND PLAN:    Problem List Items Addressed This Visit       Cardiovascular and Mediastinum   Paroxysmal SVT (supraventricular tachycardia) - Primary   Last episode requiring ER visit was in December 2020. Subjectively reports 5 episodes at home of palpitations with last episode November 2025, spontaneously terminated.  Continue metoprolol  XL 25 mg once daily. Advise monitoring at home with [facility] mobile or smart watches. If symptoms persist and not terminated with vagal maneuvers, sent to the ER.         Other   Hyperlipidemia   Lipid panel 11/09/2023 otal cholesterol 207, triglycerides 116, HDL 50, LDL 136.  Currently on Zetia 10 mg 3 times a week. Unable to take statins. Titrate up Zetia as tolerated. Will defer follow-up to PCP. Follow-up blood work shows LDL still above 70 mg/dL would recommend considering PCSK9 inhibitors.       Relevant Orders   EKG 12-Lead (Completed)    Return to clinic tentatively in 1 year.  History of Present Illness:    [name] is a 64 y.o. female who is being seen today for follow-up visit. PCP is [place], [place], FNP in [place], [place]. Last visit with me in the office was 09/06/2022.  Previously followed up with Dr. Monetta.  Here for the visit today by herself.  Lives with her sister in [place] .  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  Walks regularly with her dog.   Has history of paroxysmal SVT [in ER December 2020 converted with IV adenosine ; infrequent brief episodes since then once or twice a year response to vagal maneuvers], hyperlipidemia, diabetes, right leg DVT [in the setting of OCP use], heterozygous for factor V Leiden mutation, obesity, OSA-uses  CPAP  Mentions overall she has been doing well. Continues to have on and off episodes of palpitations.  Reports about 5 episodes in 2025.  Last episode was in November. 1 episode was walking up an incline with her dog felt fast heart rate and brief sense of lightheadedness, lightheadedness resolved quickly however heart rate persisted for several minutes at a high rate until she went home laid down and performed vagal maneuvers where she was bearing down. Had another episode similar while walking in the kitchen that was intense lasted for several minutes.  Longest episode was about 30 minutes. She does not have a smart watch or Kardia mobile at home currently.  Reviewed options to use these devices to monitor rhythm at home.  No syncopal episodes. No falling down episodes. Good functional status otherwise.  No chest pain or shortness of breath.  No pedal edema.  Good compliance with her medications.  EKG in the clinic today shows sinus rhythm heart rate 93/min, PR interval 150 ms, QRS duration 78 ms.  QTc 447 ms no ischemic changes however nonspecific ST-T changes in the lateral precordial leads.  Reports recent blood work with PCP was in October 2025. Labs reviewed from [facility] 11/09/2023 hemoglobin A1c 5.8 Thyroid panel normal with TSH 0.95, normal thyroxine and T3. Lipid panel total cholesterol 207, triglycerides 116, HDL 50, LDL 136. BUN 16, creatinine 0.79, eGFR 84. Sodium 139 and potassium 5.5 Normal transaminases and alkaline phosphatase. Hemoglobin  12.1, hematocrit 38.   Past Medical History:  Diagnosis Date   Allergy    Anxiety    Atypical chest pain 08/24/2011   Blood clot in vein    right leg   Chest tightness    Chronic migraine 10/19/2017   Chronic venous insufficiency 05/23/2013   Oneil Millet, MD Continue all current conservative treatment. 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 Seeking  approval for this treatment and will proceed as soon as is approval is obtained.    Depression    Diabetes (HCC) 07/17/2014   Factor 5 Leiden mutation, heterozygous 07/17/2014   Factor V deficiency (HCC)    Family history of premature coronary artery disease 08/24/2011   History of adverse effect of venous thromboembolism (VTE) prophylaxis 02/04/2019   History of COVID-19 02/14/2019   Hypercholesterolemia 08/24/2011   Hyperlipidemia    Migraine    Migraine, chronic, without aura, intractable 09/13/2012   Botox approved diagnosis.    Morbid obesity (HCC) 09/07/2018   Morbid obesity with BMI of 40.0-44.9, adult (HCC)    Neuralgia 07/30/2015   Paroxysmal SVT (supraventricular tachycardia) 02/04/2019   Pneumonia due to COVID-19 virus 02/04/2019   Prediabetes 09/06/2022   Sleep apnea 10/19/2017   Trochanteric bursitis of left hip 11/12/2019   Trochanteric bursitis of right hip 11/12/2019   Type 2 diabetes mellitus without complication, without long-term current use of insulin  (HCC) 09/08/2018   Venous thrombosis 12/10/2013   Occurred after treatment for varicose veins, she is on xarelto     Vitamin D  deficiency 07/17/2014    Past Surgical History:  Procedure Laterality Date   TUBAL LIGATION  2006   VEIN LIGATION Bilateral     Current Medications: Active Medications[1]   Allergies:   Statins and Codeine   Social History   Socioeconomic History   Marital status: Divorced    Spouse name: Not on file   Number of children: 0   Years of education: 12   Highest education level: Not on file  Occupational History   Occupation: Engineering Geologist: [employer]  Tobacco Use   Smoking status: Never   Smokeless tobacco: Never  Vaping Use   Vaping status: Never Used  Substance and Sexual Activity   Alcohol use: Yes    Alcohol/week: 1.0 standard drink of alcohol    Types: 1 Glasses of wine per week    Comment: OCC   Drug use: No   Sexual activity: Not on  file  Other Topics Concern   Not on file  Social History Narrative   Patient is divorced. Patient has a high school education and works for [employer].    Right handed.   Caffeine- two daily            Social Drivers of Health   Tobacco Use: Low Risk (02/05/2024)   Patient History    Smoking Tobacco Use: Never    Smokeless Tobacco Use: Never    Passive Exposure: Not on file  Financial Resource Strain: Not on file  Food Insecurity: Not on file  Transportation Needs: Not on file  Physical Activity: Not on file  Stress: Not on file  Social Connections: Unknown (05/27/2021)   Received from [facility]   Social Network    Social Network: Not on file  Depression (PHQ2-9): Not on file  Alcohol Screen: Not on file  Housing: Not on file  Utilities: Not on file  Health Literacy:  Not on file     Family History: The patient's family history includes Breast cancer in her sister; Dementia (age of onset: 49) in her father; Diabetes in her brother and sister; Diabetes (age of onset: 73) in her father; Diabetes (age of onset: 26) in her mother; Heart disease in her brother; Hyperlipidemia in her brother, father, and sister; Hypertension in her brother and sister. ROS:   Please see the history of present illness.    All 14 point review of systems negative except as described per history of present illness.  EKGs/Labs/Other Studies Reviewed:    The following studies were reviewed today:   EKG:  EKG Interpretation Date/Time:  Monday February 05 2024 14:57:22 EST Ventricular Rate:  93 PR Interval:  150 QRS Duration:  78 QT Interval:  360 QTC Calculation: 447 R Axis:   24  Text Interpretation: Normal sinus rhythm Nonspecific T wave abnormality When compared with ECG of 06-Sep-2022 15:52, Borderline criteria for Anterior infarct are no longer Present Criteria for Inferior infarct are no longer Present Confirmed by Liborio Hai reddy [phone]) on 02/05/2024 2:59:03 PM    Recent  Labs: No results found for requested labs within last 365 days.  Recent Lipid Panel    Component Value Date/Time   CHOL 217 (H) 12/25/2012 0814   TRIG 126 12/25/2012 0814   HDL 52 12/25/2012 0814   CHOLHDL 4.2 12/25/2012 0814   VLDL 25 12/25/2012 0814   LDLCALC 140 (H) 12/25/2012 0814    Physical Exam:    VS:  BP 124/84   Pulse 93   Ht 5' 5 (1.651 m)   Wt 206 lb 12.8 oz (93.8 kg)   LMP 05/31/2011   SpO2 96%   BMI 34.41 kg/m     Wt Readings from Last 3 Encounters:  02/05/24 206 lb 12.8 oz (93.8 kg)  10/03/23 212 lb (96.2 kg)  09/27/22 224 lb 6.4 oz (101.8 kg)     GENERAL:  Well nourished, well developed in no acute distress NECK: No JVD; No carotid bruits CARDIAC: RRR, S1 and S2 present, no murmurs, no rubs, no gallops CHEST:  Clear to auscultation without rales, wheezing or rhonchi  Extremities: No pitting pedal edema. Pulses bilaterally symmetric with radial 2+ and dorsalis pedis 2+ NEUROLOGIC:  Alert and oriented x 3  Medication Adjustments/Labs and Tests Ordered: Current medicines are reviewed at length with the patient today.  Concerns regarding medicines are outlined above.  Orders Placed This Encounter  Procedures   EKG 12-Lead   No orders of the defined types were placed in this encounter.   Signed, Hai reddy Myreon Wimer, MD, MPH, [facility]. 02/05/2024 3:23 PM    [facility]     [1]  Current Meds  Medication Sig   Ascorbic Acid  (SUPER C COMPLEX PO) Take 1 tablet by mouth daily.   aspirin  EC 325 MG tablet Take 162.5 mg by mouth daily.   cyanocobalamin (VITAMIN B12) 1000 MCG tablet Take 1,000 mcg by mouth daily.   ezetimibe (ZETIA) 10 MG tablet Take 10 mg by mouth daily.   ibuprofen (ADVIL,MOTRIN) 200 MG tablet Take 400 mg by mouth every 6 (six) hours as needed for mild pain.   Magnesium 400 MG TABS Take 400 mg by mouth daily.   metoprolol  succinate (TOPROL -XL) 25 MG 24 hr tablet Take 1 tablet (25 mg total) by mouth daily.    OZEMPIC, 2 MG/DOSE, 8 MG/3ML SOPN Inject 2 mg into the skin once a week.  SUMAtriptan  (IMITREX ) 100 MG tablet Take 1 tab at onset of migraine.  May repeat in 2 hrs, if needed.  Max dose: 2 tabs/day. This is a 90 day prescription.   SUMAtriptan  6 MG/0.5ML SOAJ SMARTSIG:0.5 Milliliter(s) SUB-Q 1-2 Times Daily   venlafaxine  XR (EFFEXOR -XR) 75 MG 24 hr capsule Take 1 capsule (75 mg total) by mouth at bedtime.   "

## 2024-02-05 NOTE — Assessment & Plan Note (Signed)
 Last episode requiring ER visit was in December 2020. Subjectively reports 5 episodes at home of palpitations with last episode November 2025, spontaneously terminated.  Continue metoprolol  XL 25 mg once daily. Advise monitoring at home with [facility] mobile or smart watches. If symptoms persist and not terminated with vagal maneuvers, sent to the ER.

## 2024-02-05 NOTE — Assessment & Plan Note (Signed)
 Lipid panel 11/09/2023 otal cholesterol 207, triglycerides 116, HDL 50, LDL 136.  Currently on Zetia 10 mg 3 times a week. Unable to take statins. Titrate up Zetia as tolerated. Will defer follow-up to PCP. Follow-up blood work shows LDL still above 70 mg/dL would recommend considering PCSK9 inhibitors.

## 2024-02-05 NOTE — Patient Instructions (Signed)
